# Patient Record
Sex: Female | Born: 1945 | Race: Black or African American | Hispanic: No | Marital: Married | Smoking: Light tobacco smoker
Health system: Southern US, Community
[De-identification: ages and names within clinical notes are randomized; demographics above are authoritative.]

## PROBLEM LIST (undated history)

## (undated) DIAGNOSIS — E559 Vitamin D deficiency, unspecified: Secondary | ICD-10-CM

## (undated) DIAGNOSIS — D0510 Intraductal carcinoma in situ of unspecified breast: Secondary | ICD-10-CM

## (undated) DIAGNOSIS — G5600 Carpal tunnel syndrome, unspecified upper limb: Secondary | ICD-10-CM

## (undated) DIAGNOSIS — E041 Nontoxic single thyroid nodule: Secondary | ICD-10-CM

## (undated) DIAGNOSIS — N819 Female genital prolapse, unspecified: Secondary | ICD-10-CM

## (undated) DIAGNOSIS — I10 Essential (primary) hypertension: Secondary | ICD-10-CM

## (undated) DIAGNOSIS — E119 Type 2 diabetes mellitus without complications: Secondary | ICD-10-CM

## (undated) DIAGNOSIS — C801 Malignant (primary) neoplasm, unspecified: Secondary | ICD-10-CM

## (undated) DIAGNOSIS — D126 Benign neoplasm of colon, unspecified: Secondary | ICD-10-CM

## (undated) DIAGNOSIS — E7800 Pure hypercholesterolemia, unspecified: Secondary | ICD-10-CM

## (undated) DIAGNOSIS — R7303 Prediabetes: Secondary | ICD-10-CM

## (undated) DIAGNOSIS — M1990 Unspecified osteoarthritis, unspecified site: Secondary | ICD-10-CM

## (undated) HISTORY — DX: Intraductal carcinoma in situ of unspecified breast: D05.10

## (undated) HISTORY — PX: MASTECTOMY: SHX3

## (undated) HISTORY — PX: GYNECOLOGIC CRYOSURGERY: SHX857

## (undated) HISTORY — PX: TUBAL LIGATION: SHX77

## (undated) HISTORY — DX: Benign neoplasm of colon, unspecified: D12.6

## (undated) HISTORY — DX: Type 2 diabetes mellitus without complications: E11.9

---

## 1995-06-16 HISTORY — PX: MULTIPLE TOOTH EXTRACTIONS: SHX2053

## 2004-10-13 ENCOUNTER — Ambulatory Visit: Payer: Self-pay | Admitting: Internal Medicine

## 2006-01-04 ENCOUNTER — Ambulatory Visit: Payer: Self-pay | Admitting: Internal Medicine

## 2007-02-16 ENCOUNTER — Ambulatory Visit: Payer: Self-pay | Admitting: Nurse Practitioner

## 2007-10-14 ENCOUNTER — Emergency Department: Payer: Self-pay | Admitting: Emergency Medicine

## 2007-10-14 ENCOUNTER — Other Ambulatory Visit: Payer: Self-pay

## 2009-07-18 ENCOUNTER — Ambulatory Visit: Payer: Self-pay | Admitting: Nurse Practitioner

## 2009-10-11 ENCOUNTER — Ambulatory Visit: Payer: Self-pay | Admitting: Gastroenterology

## 2010-08-27 ENCOUNTER — Ambulatory Visit: Payer: Self-pay

## 2010-08-28 ENCOUNTER — Ambulatory Visit: Payer: Self-pay

## 2011-11-17 ENCOUNTER — Ambulatory Visit: Payer: Self-pay

## 2012-11-17 ENCOUNTER — Ambulatory Visit: Payer: Self-pay | Admitting: Family Medicine

## 2013-02-28 ENCOUNTER — Ambulatory Visit: Payer: Self-pay | Admitting: Family Medicine

## 2013-08-30 DIAGNOSIS — N819 Female genital prolapse, unspecified: Secondary | ICD-10-CM | POA: Insufficient documentation

## 2013-08-30 DIAGNOSIS — D126 Benign neoplasm of colon, unspecified: Secondary | ICD-10-CM | POA: Insufficient documentation

## 2013-08-30 DIAGNOSIS — E7800 Pure hypercholesterolemia, unspecified: Secondary | ICD-10-CM | POA: Insufficient documentation

## 2013-08-30 DIAGNOSIS — G5600 Carpal tunnel syndrome, unspecified upper limb: Secondary | ICD-10-CM | POA: Insufficient documentation

## 2013-08-30 DIAGNOSIS — I10 Essential (primary) hypertension: Secondary | ICD-10-CM | POA: Insufficient documentation

## 2013-10-31 DIAGNOSIS — E041 Nontoxic single thyroid nodule: Secondary | ICD-10-CM | POA: Insufficient documentation

## 2014-01-02 ENCOUNTER — Ambulatory Visit: Payer: Self-pay | Admitting: Family Medicine

## 2014-01-02 DIAGNOSIS — E559 Vitamin D deficiency, unspecified: Secondary | ICD-10-CM | POA: Insufficient documentation

## 2014-01-02 DIAGNOSIS — E119 Type 2 diabetes mellitus without complications: Secondary | ICD-10-CM

## 2014-01-02 HISTORY — DX: Type 2 diabetes mellitus without complications: E11.9

## 2014-07-05 ENCOUNTER — Ambulatory Visit: Payer: Self-pay | Admitting: Family Medicine

## 2015-01-21 ENCOUNTER — Encounter: Payer: Self-pay | Admitting: *Deleted

## 2015-01-22 ENCOUNTER — Ambulatory Visit
Admission: RE | Admit: 2015-01-22 | Discharge: 2015-01-22 | Disposition: A | Discharge status: Home / Self Care | Payer: Medicare PPO | Source: Ambulatory Visit | Attending: Gastroenterology | Admitting: Gastroenterology

## 2015-01-22 ENCOUNTER — Ambulatory Visit: Payer: Medicare PPO | Admitting: Anesthesiology

## 2015-01-22 ENCOUNTER — Encounter: Payer: Self-pay | Admitting: *Deleted

## 2015-01-22 ENCOUNTER — Encounter
Admission: RE | Disposition: A | Discharge status: Home / Self Care | Payer: Self-pay | Source: Ambulatory Visit | Attending: Gastroenterology

## 2015-01-22 DIAGNOSIS — Z79899 Other long term (current) drug therapy: Secondary | ICD-10-CM | POA: Insufficient documentation

## 2015-01-22 DIAGNOSIS — Z833 Family history of diabetes mellitus: Secondary | ICD-10-CM | POA: Insufficient documentation

## 2015-01-22 DIAGNOSIS — K5730 Diverticulosis of large intestine without perforation or abscess without bleeding: Secondary | ICD-10-CM | POA: Diagnosis not present

## 2015-01-22 DIAGNOSIS — Z8249 Family history of ischemic heart disease and other diseases of the circulatory system: Secondary | ICD-10-CM | POA: Insufficient documentation

## 2015-01-22 DIAGNOSIS — I10 Essential (primary) hypertension: Secondary | ICD-10-CM | POA: Insufficient documentation

## 2015-01-22 DIAGNOSIS — Z888 Allergy status to other drugs, medicaments and biological substances status: Secondary | ICD-10-CM | POA: Insufficient documentation

## 2015-01-22 DIAGNOSIS — Z7982 Long term (current) use of aspirin: Secondary | ICD-10-CM | POA: Insufficient documentation

## 2015-01-22 DIAGNOSIS — Z1211 Encounter for screening for malignant neoplasm of colon: Secondary | ICD-10-CM | POA: Diagnosis not present

## 2015-01-22 DIAGNOSIS — E559 Vitamin D deficiency, unspecified: Secondary | ICD-10-CM | POA: Insufficient documentation

## 2015-01-22 DIAGNOSIS — D122 Benign neoplasm of ascending colon: Secondary | ICD-10-CM | POA: Insufficient documentation

## 2015-01-22 DIAGNOSIS — N8110 Cystocele, unspecified: Secondary | ICD-10-CM | POA: Insufficient documentation

## 2015-01-22 DIAGNOSIS — Z8042 Family history of malignant neoplasm of prostate: Secondary | ICD-10-CM | POA: Insufficient documentation

## 2015-01-22 DIAGNOSIS — E041 Nontoxic single thyroid nodule: Secondary | ICD-10-CM | POA: Diagnosis not present

## 2015-01-22 DIAGNOSIS — Z800 Family history of malignant neoplasm of digestive organs: Secondary | ICD-10-CM | POA: Insufficient documentation

## 2015-01-22 DIAGNOSIS — K529 Noninfective gastroenteritis and colitis, unspecified: Secondary | ICD-10-CM | POA: Insufficient documentation

## 2015-01-22 DIAGNOSIS — E780 Pure hypercholesterolemia: Secondary | ICD-10-CM | POA: Diagnosis not present

## 2015-01-22 DIAGNOSIS — F17200 Nicotine dependence, unspecified, uncomplicated: Secondary | ICD-10-CM | POA: Insufficient documentation

## 2015-01-22 DIAGNOSIS — E119 Type 2 diabetes mellitus without complications: Secondary | ICD-10-CM | POA: Diagnosis not present

## 2015-01-22 DIAGNOSIS — Z8262 Family history of osteoporosis: Secondary | ICD-10-CM | POA: Insufficient documentation

## 2015-01-22 HISTORY — DX: Pure hypercholesterolemia, unspecified: E78.00

## 2015-01-22 HISTORY — DX: Essential (primary) hypertension: I10

## 2015-01-22 HISTORY — DX: Type 2 diabetes mellitus without complications: E11.9

## 2015-01-22 HISTORY — DX: Female genital prolapse, unspecified: N81.9

## 2015-01-22 HISTORY — DX: Nontoxic single thyroid nodule: E04.1

## 2015-01-22 HISTORY — DX: Carpal tunnel syndrome, unspecified upper limb: G56.00

## 2015-01-22 HISTORY — PX: COLONOSCOPY WITH PROPOFOL: SHX5780

## 2015-01-22 HISTORY — DX: Vitamin D deficiency, unspecified: E55.9

## 2015-01-22 SURGERY — COLONOSCOPY WITH PROPOFOL
Anesthesia: General

## 2015-01-22 MED ORDER — GLYCOPYRROLATE 0.2 MG/ML IJ SOLN
INTRAMUSCULAR | Status: DC | PRN
  Administered 2015-01-22: 0.2 mg via INTRAVENOUS

## 2015-01-22 MED ORDER — PROPOFOL 10 MG/ML IV BOLUS
INTRAVENOUS | Status: DC | PRN
  Administered 2015-01-22: 50 mg via INTRAVENOUS

## 2015-01-22 MED ORDER — LIDOCAINE HCL (CARDIAC) 20 MG/ML IV SOLN
INTRAVENOUS | Status: DC | PRN
  Administered 2015-01-22: 60 mg via INTRAVENOUS

## 2015-01-22 MED ORDER — PROPOFOL INFUSION 10 MG/ML OPTIME
INTRAVENOUS | Status: DC | PRN
  Administered 2015-01-22: 140 ug/kg/min via INTRAVENOUS

## 2015-01-22 MED ORDER — METOPROLOL TARTRATE 1 MG/ML IV SOLN
INTRAVENOUS | Status: DC | PRN
  Administered 2015-01-22 (×2): 2 mg via INTRAVENOUS

## 2015-01-22 MED ORDER — SODIUM CHLORIDE 0.9 % IV SOLN
INTRAVENOUS | Status: DC
  Administered 2015-01-22: 14:00:00 via INTRAVENOUS

## 2015-01-22 NOTE — H&P (Signed)
Outpatient short stay form Pre-procedure 01/22/2015 2:27 PM Lynn Sails MD  Primary Physician: Dr. Thereasa Distance, Dr. Wilson Singer  Reason for visit:  Screening colonoscopy, high-risk screening due to family history of colon cancer in several primary relatives  History of present illness:  Patient is a 69 year old female presenting today for a colonoscopy as above. Colonoscopy was in 2011. He has a strong family history of colon cancer in her family with mother 2 brothers and a maternal aunt.  She takes a daily 81 mg aspirin that has been held. He takes no other aspirin products or anticoagulation medications.  She tolerated her prep well last night.  I talked with her this morning she let me know that she does have a pessary. I contacted Dr. Ouida Sills who kindly came over and remove the device we would be able to do the procedure.    Current facility-administered medications:  .  0.9 %  sodium chloride infusion, , Intravenous, Continuous, Lynn Sails, MD  Prescriptions prior to admission  Medication Sig Dispense Refill Last Dose  . aspirin EC 81 MG tablet Take 81 mg by mouth daily.   01/20/2015 at Unknown time  . atenolol (TENORMIN) 50 MG tablet Take 50 mg by mouth daily.   01/21/2015 at 2200  . clobetasol cream (TEMOVATE) 9.97 % Apply 1 application topically 2 (two) times daily.   Past Month at Unknown time  . fluticasone (FLONASE) 50 MCG/ACT nasal spray Place into both nostrils daily.   Not Taking at Unknown time  . meloxicam (MOBIC) 15 MG tablet Take 15 mg by mouth daily.   Not Taking at Unknown time     Allergies  Allergen Reactions  . Lipitor [Atorvastatin]   . Lipofen [Fenofibrate]   . Mevacor [Lovastatin]   . Zetia [Ezetimibe]   . Zocor [Simvastatin]      Past Medical History  Diagnosis Date  . Hypertension   . Carpal tunnel syndrome   . Pure hypercholesterolemia   . Vaginal vault prolapse   . Nontoxic uninodular goiter   . Vitamin D deficiency      Review of systems:      Physical Exam    Heart and lungs: Regular rate and rhythm without rub or gallop, laterally clear to auscultation.    HEENT: Normocephalic atraumatic eyes are anicteric    Other:     Pertinant exam for procedure: Soft nontender nondistended bowel sounds positive normoactive    Planned proceedures: Colonoscopy and indicated procedures I have discussed the risks benefits and complications of procedures to include not limited to bleeding, infection, perforation and the risk of sedation and the patient wishes to proceed.    Lynn Sails, MD Gastroenterology 01/22/2015  2:27 PM

## 2015-01-22 NOTE — Anesthesia Preprocedure Evaluation (Signed)
Anesthesia Evaluation  Patient identified by MRN, date of birth, ID band Patient awake    Reviewed: Allergy & Precautions, H&P , NPO status , Patient's Chart, lab work & pertinent test results  Airway Mallampati: III  TM Distance: >3 FB Neck ROM: limited    Dental  (+) Upper Dentures, Lower Dentures, Poor Dentition, Missing   Pulmonary Current Smoker,  breath sounds clear to auscultation  Pulmonary exam normal       Cardiovascular Exercise Tolerance: Good hypertension, - Past MI Normal cardiovascular examRhythm:regular Rate:Normal     Neuro/Psych  Neuromuscular disease negative neurological ROS  negative psych ROS   GI/Hepatic negative GI ROS, Neg liver ROS,   Endo/Other  negative endocrine ROS  Renal/GU negative Renal ROS  negative genitourinary   Musculoskeletal   Abdominal   Peds  Hematology negative hematology ROS (+)   Anesthesia Other Findings Past Medical History:   Hypertension                                                 Carpal tunnel syndrome                                       Pure hypercholesterolemia                                    Vaginal vault prolapse                                       Nontoxic uninodular goiter                                   Vitamin D deficiency                                         Reproductive/Obstetrics negative OB ROS                             Anesthesia Physical Anesthesia Plan  ASA: III  Anesthesia Plan: General   Post-op Pain Management:    Induction:   Airway Management Planned:   Additional Equipment:   Intra-op Plan:   Post-operative Plan:   Informed Consent: I have reviewed the patients History and Physical, chart, labs and discussed the procedure including the risks, benefits and alternatives for the proposed anesthesia with the patient or authorized representative who has indicated his/her understanding and  acceptance.   Dental Advisory Given  Plan Discussed with: Anesthesiologist, CRNA and Surgeon  Anesthesia Plan Comments:         Anesthesia Quick Evaluation

## 2015-01-22 NOTE — Transfer of Care (Signed)
Immediate Anesthesia Transfer of Care Note  Patient: Lynn Wood  Procedure(s) Performed: Procedure(s): COLONOSCOPY WITH PROPOFOL (N/A)  Patient Location: Endoscopy Unit  Anesthesia Type:General  Level of Consciousness: awake, alert , oriented and pateint uncooperative  Airway & Oxygen Therapy: Patient Spontanous Breathing and Patient connected to nasal cannula oxygen  Post-op Assessment: Report given to RN, Post -op Vital signs reviewed and stable and Patient moving all extremities X 4  Post vital signs: Reviewed and stable  Last Vitals:  Filed Vitals:   01/22/15 1514  BP: 143/63  Pulse: 77  Temp: 36.1 C  Resp: 21    Complications: No apparent anesthesia complications

## 2015-01-22 NOTE — Op Note (Signed)
[facility] Gastroenterology Patient Name: Lynn Wood Procedure Date: 01/22/2015 2:16 PM MRN: [id number] Account #: [id number] Date of Birth: 07-Sep-1945 Admit Type: Outpatient Age: 69 Room: [facility] ENDO ROOM 3 Gender: Female Note Status: Finalized Procedure:         Colonoscopy Indications:       Family history of colon cancer in multiple first-degree                     relatives Providers:         Lollie Sails, MD Medicines:         Monitored Anesthesia Care Complications:     No immediate complications. Procedure:         Pre-Anesthesia Assessment:                    - ASA Grade Assessment: III - A patient with severe                     systemic disease.                    After obtaining informed consent, the colonoscope was                     passed under direct vision. Throughout the procedure, the                     patient's blood pressure, pulse, and oxygen saturations                     were monitored continuously. The Olympus PCF-H180AL                     colonoscope ( S#: [id number] ) was introduced through the                     anus and advanced to the the cecum, identified by                     appendiceal orifice and ileocecal valve. The colonoscopy                     was performed without difficulty. The patient tolerated                     the procedure well. The quality of the bowel preparation                     was good except the ascending colon was fair. Findings:      A 1 mm polyp was found in the ascending colon in the proximal ascending       colon on a fold with atypical appearing mucosa, possibly hyperplastic.       The polyp was sessile. The polyp was removed with a cold biopsy forceps.       Resection and retrieval were complete. The fold itself was also biopsied       and placed in a separate jar.      Multiple small-mouthed diverticula were found in the sigmoid colon, in       the descending colon, in the transverse  colon, in the ascending colon       and in the cecum.      The digital rectal exam was normal. Impression:        -  One 1 mm polyp in the ascending colon in the proximal                     ascending colon. Resected and retrieved.                    - Granularity in the proximal ascending colon. Biopsied.                    - Diverticulosis in the sigmoid colon, in the descending                     colon, in the transverse colon, in the ascending colon and                     in the cecum. Recommendation:    - Await pathology results.                    - Telephone GI clinic for pathology results in 1 week. Procedure Code(s): --- Professional ---                    [phone], Colonoscopy, flexible; with biopsy, single or                     multiple Diagnosis Code(s): --- Professional ---                    211.3, Benign neoplasm of colon                    569.89, Other specified disorders of intestine                    V16.0, Family history of malignant neoplasm of                     gastrointestinal tract                    562.10, Diverticulosis of colon (without mention of                     hemorrhage) CPT copyright 2014 American Medical Association. All rights reserved. The codes documented in this report are preliminary and upon coder review may  be revised to meet current compliance requirements. Lollie Sails, MD 01/22/2015 3:14:57 PM This report has been signed electronically. Number of Addenda: 0 Note Initiated On: 01/22/2015 2:16 PM Scope Withdrawal Time: 0 hours 14 minutes 24 seconds  Total Procedure Duration: 0 hours 21 minutes 44 seconds       [facility]

## 2015-01-23 NOTE — Anesthesia Postprocedure Evaluation (Signed)
  Anesthesia Post-op Note  Patient: Lynn Wood  Procedure(s) Performed: Procedure(s): COLONOSCOPY WITH PROPOFOL (N/A)  Anesthesia type:General  Patient location: PACU  Post pain: Pain level controlled  Post assessment: Post-op Vital signs reviewed, Patient's Cardiovascular Status Stable, Respiratory Function Stable, Patent Airway and No signs of Nausea or vomiting  Post vital signs: Reviewed and stable  Last Vitals:  Filed Vitals:   01/22/15 1550  BP: 180/84  Pulse: 54  Temp:   Resp: 18    Level of consciousness: awake, alert  and patient cooperative  Complications: No apparent anesthesia complications

## 2015-01-24 LAB — SURGICAL PATHOLOGY

## 2015-01-25 ENCOUNTER — Encounter: Payer: Self-pay | Admitting: Gastroenterology

## 2015-11-05 DIAGNOSIS — M1712 Unilateral primary osteoarthritis, left knee: Secondary | ICD-10-CM | POA: Insufficient documentation

## 2016-06-02 ENCOUNTER — Other Ambulatory Visit: Payer: Self-pay | Admitting: Nurse Practitioner

## 2016-06-02 DIAGNOSIS — N6320 Unspecified lump in the left breast, unspecified quadrant: Secondary | ICD-10-CM

## 2016-06-02 DIAGNOSIS — N6452 Nipple discharge: Secondary | ICD-10-CM

## 2016-06-02 DIAGNOSIS — N6453 Retraction of nipple: Secondary | ICD-10-CM

## 2016-06-15 DIAGNOSIS — C801 Malignant (primary) neoplasm, unspecified: Secondary | ICD-10-CM

## 2016-06-15 HISTORY — DX: Malignant (primary) neoplasm, unspecified: C80.1

## 2016-06-22 ENCOUNTER — Ambulatory Visit
Admission: RE | Admit: 2016-06-22 | Discharge: 2016-06-22 | Disposition: A | Discharge status: Home / Self Care | Payer: Medicare Other | Source: Ambulatory Visit | Attending: Nurse Practitioner | Admitting: Nurse Practitioner

## 2016-06-22 DIAGNOSIS — N6452 Nipple discharge: Secondary | ICD-10-CM

## 2016-06-22 DIAGNOSIS — N6453 Retraction of nipple: Secondary | ICD-10-CM | POA: Insufficient documentation

## 2016-06-22 DIAGNOSIS — N6320 Unspecified lump in the left breast, unspecified quadrant: Secondary | ICD-10-CM

## 2016-06-23 ENCOUNTER — Other Ambulatory Visit (HOSPITAL_COMMUNITY): Payer: Self-pay | Admitting: Nurse Practitioner

## 2016-06-23 DIAGNOSIS — N6459 Other signs and symptoms in breast: Secondary | ICD-10-CM

## 2016-06-23 DIAGNOSIS — N6452 Nipple discharge: Secondary | ICD-10-CM

## 2016-06-30 ENCOUNTER — Ambulatory Visit (HOSPITAL_COMMUNITY)
Admission: RE | Admit: 2016-06-30 | Discharge: 2016-06-30 | Disposition: A | Discharge status: Home / Self Care | Payer: Medicare Other | Source: Ambulatory Visit | Attending: Nurse Practitioner | Admitting: Nurse Practitioner

## 2016-06-30 DIAGNOSIS — N6459 Other signs and symptoms in breast: Secondary | ICD-10-CM | POA: Insufficient documentation

## 2016-06-30 DIAGNOSIS — N6452 Nipple discharge: Secondary | ICD-10-CM | POA: Diagnosis not present

## 2016-06-30 LAB — POCT I-STAT CREATININE: Creatinine, Ser: 0.8 mg/dL (ref 0.44–1.00)

## 2016-06-30 MED ORDER — GADOBENATE DIMEGLUMINE 529 MG/ML IV SOLN
15.0000 mL | Freq: Once | INTRAVENOUS | Status: AC | PRN
  Administered 2016-06-30: 14 mL via INTRAVENOUS

## 2016-07-03 ENCOUNTER — Other Ambulatory Visit: Payer: Self-pay | Admitting: Nurse Practitioner

## 2016-07-03 DIAGNOSIS — N63 Unspecified lump in unspecified breast: Secondary | ICD-10-CM

## 2016-07-03 DIAGNOSIS — R590 Localized enlarged lymph nodes: Secondary | ICD-10-CM

## 2016-07-03 DIAGNOSIS — N6459 Other signs and symptoms in breast: Secondary | ICD-10-CM

## 2016-07-03 DIAGNOSIS — N6452 Nipple discharge: Secondary | ICD-10-CM

## 2016-07-07 ENCOUNTER — Ambulatory Visit
Admission: RE | Admit: 2016-07-07 | Discharge: 2016-07-07 | Disposition: A | Discharge status: Home / Self Care | Payer: Medicare Other | Source: Ambulatory Visit | Attending: Nurse Practitioner | Admitting: Nurse Practitioner

## 2016-07-07 ENCOUNTER — Other Ambulatory Visit: Payer: Self-pay | Admitting: Nurse Practitioner

## 2016-07-07 DIAGNOSIS — N6452 Nipple discharge: Secondary | ICD-10-CM

## 2016-07-07 DIAGNOSIS — N6459 Other signs and symptoms in breast: Secondary | ICD-10-CM

## 2016-07-07 DIAGNOSIS — N63 Unspecified lump in unspecified breast: Secondary | ICD-10-CM

## 2016-07-07 MED ORDER — GADOBENATE DIMEGLUMINE 529 MG/ML IV SOLN
15.0000 mL | Freq: Once | INTRAVENOUS | Status: AC | PRN
  Administered 2016-07-07: 15 mL via INTRAVENOUS

## 2016-07-10 ENCOUNTER — Ambulatory Visit
Admission: RE | Admit: 2016-07-10 | Discharge: 2016-07-10 | Disposition: A | Discharge status: Home / Self Care | Payer: Medicare Other | Source: Ambulatory Visit | Attending: Nurse Practitioner | Admitting: Nurse Practitioner

## 2016-07-10 DIAGNOSIS — R590 Localized enlarged lymph nodes: Secondary | ICD-10-CM | POA: Insufficient documentation

## 2016-07-14 ENCOUNTER — Ambulatory Visit (INDEPENDENT_AMBULATORY_CARE_PROVIDER_SITE_OTHER): Payer: Medicare Other | Admitting: Surgery

## 2016-07-14 ENCOUNTER — Encounter: Payer: Self-pay | Admitting: Surgery

## 2016-07-14 ENCOUNTER — Telehealth: Payer: Self-pay

## 2016-07-14 VITALS — BP 206/96 | HR 56 | Temp 98.3°F | Ht 64.0 in | Wt 165.8 lb

## 2016-07-14 DIAGNOSIS — D0512 Intraductal carcinoma in situ of left breast: Secondary | ICD-10-CM | POA: Diagnosis not present

## 2016-07-14 NOTE — Progress Notes (Signed)
Patient ID: Lynn Wood, female   DOB: 1945-08-11, 71 y.o.   MRN: [id number]  HPI Lynn Wood is a 71 y.o. female referred by Dr. 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 She initially went to her primary care provider with a left breast discharge that prompted mammogram and MRI this will work inconclusive and an MRI was performed. I have personally reviewed the MRI and there is evidence of a nonenhancing left breast area encompassing 6 x 2 cm this is located on this up a renal area and also on the inferior breast from 4:00 to 8:00. She did get MRI core needle biopsy of 2 areas on the left lower outer quadrant and both lesions were high grade DCIS. I have reviewed the pathology and she is ER positive PR negative. She does have a cousin with a history of breast cancer. She took birth control for 2-3 years and menopause was at age 59. She has had 3 pregnancies and her Mrs. is started when she was 71 years old. She reports no nipple discharge since December, this is intermittent. After she had a biopsy that she has some breast pain intermittently. She does smoke about 1 pack of cigarettes a week      HPI  Past Medical History:  Diagnosis Date  . Carpal tunnel syndrome   . Diabetes mellitus type 2, uncomplicated ([place]) [id number]  . Hypertension   . Nontoxic uninodular goiter   . Pure hypercholesterolemia   . Vaginal vault prolapse   . Vitamin D deficiency     Past Surgical History:  Procedure Laterality Date  . COLONOSCOPY WITH PROPOFOL N/A 01/22/2015   Procedure: COLONOSCOPY WITH PROPOFOL;  Surgeon: Lollie Sails, MD;  [facility]: [facility] ENDOSCOPY;  Service: Endoscopy;  Laterality: N/A;  . GYNECOLOGIC CRYOSURGERY      Family History  Problem Relation Age of Onset  . Breast cancer Cousin   . Colon cancer Mother   . Prostate cancer Father   . Prostate cancer Brother   . Heart disease Daughter     Social History Social History  Substance Use Topics   . Smoking status: Current Some Day Smoker    Packs/day: 0.00    Types: Cigarettes  . Smokeless tobacco: Never Used     Comment: 1 cigarette every 1-2 weeks  . Alcohol use No    Allergies  Allergen Reactions  . Lipitor [Atorvastatin] Other (See Comments)    Arthalgia  . Lipofen [Fenofibrate] Other (See Comments)    Arthalgia  . Mevacor [Lovastatin] Other (See Comments)    Arthalgia  . Zetia [Ezetimibe] Other (See Comments)    Arthalgia  . Zocor [Simvastatin] Other (See Comments)    Arthalgia    Current Outpatient Prescriptions  Medication Sig Dispense Refill  . aspirin EC 81 MG tablet Take 81 mg by mouth daily.    Marland Kitchen atenolol (TENORMIN) 50 MG tablet Take 50 mg by mouth daily.    . clobetasol cream (TEMOVATE) [id number] % Apply 1 application topically 2 (two) times daily.    . fluticasone (FLONASE) 50 MCG/ACT nasal spray Place 1 spray into both nostrils daily.     . meloxicam (MOBIC) 15 MG tablet Take 15 mg by mouth daily.    Marland Kitchen lisinopril (PRINIVIL,ZESTRIL) 10 MG tablet Take 10 mg by mouth daily.     No current facility-administered medications for this visit.      Review of Systems A 10 point review of systems was asked and was  negative except for the information on the HPI  Physical Exam Blood pressure (!) 206/96, pulse (!) 56, temperature 98.3 F (36.8 C), temperature source Oral, height 5\' 4"  (1.626 m), weight 75.2 kg (165 lb 12.8 oz). CONSTITUTIONAL: NAD EYES: Pupils are equal, round, and reactive to light, Sclera are non-icteric. EARS, NOSE, MOUTH AND THROAT: The oropharynx is clear. The oral mucosa is pink and moist. Hearing is intact to voice. BREASTS: There is 2 small sites 4 biopsies on the left side and there is diffuse tenderness and cannot appreciate a palpable mass. I cannot reproduce the nipple discharge. She does have nipple inversion the left side.  On the right breast there are no evidence of any breast lesions. There are no abnormal lymphadenopathy on her  axillas LYMPH NODES:  Lymph nodes in the neck are normal. RESPIRATORY:  Lungs are clear. There is normal respiratory effort, with equal breath sounds bilaterally, and without pathologic use of accessory muscles. CARDIOVASCULAR: Heart is regular without murmurs, gallops, or rubs. GI: The abdomen is  soft, nontender, and nondistended. There are no palpable masses. There is no hepatosplenomegaly. There are normal bowel sounds in all quadrants. GU: Rectal deferred.   MUSCULOSKELETAL: Normal muscle strength and tone. No cyanosis or edema.   SKIN: Turgor is good and there are no pathologic skin lesions or ulcers. NEUROLOGIC: Motor and sensation is grossly normal. Cranial nerves are grossly intact. PSYCH:  Oriented to person, place and time. Affect is normal.  Data Reviewed  I have personally reviewed the patient's imaging, laboratory findings and medical records.    Assessment/Plan 71 year old female with a new the diagnosed DCIS only evident on MRI as a nonenhancing mass. Discussed with the patient in detail about my thought process. Technical challenges lies in the fact that I cannot feel the mass and can not identified it on ultrasound : performing a lumpectomy in this incision areas will be flawed with potential errors including inadequate margins. After explaining to the patient that I believe she will be better served with a simple mastectomy to address the issues of potential margin positivity she is actually in agreement with me that the best course of action would be a simple mastectomy. Obviously systems DCIS I will performing a mastectomy she will need a sentinel lymph node biopsies. I also discussed with her in detail the possibility of immediate reconstructions versus delayed reconstruction ( currently not interested in reconstruction at this time),  potential adjuvant chemotherapy and radiation therapy depending on the final pathology and staging. She will also see medical oncology as well  as radiation oncology for further discussion of adjuvant therapies. As I stated I think that she is better served with a mastectomy as opposed to 2 lumpectomies given the difficult orientation on MRI images and my inability to adequately performed good surgical margins. We'll plan for a left simple mastectomy with sentinel lymph node biopsy and no reconstruction. We will also ask PCP to try to control her SBP.     Caroleen Hamman, MD FACS General Surgeon 07/14/2016, 2:11 PM

## 2016-07-14 NOTE — Telephone Encounter (Signed)
Patient scheduled for Left Mastectomy with Sentinel Node Biopsy on 07/28/16 with Dr. Dahlia Byes.  She needs to see her PCP, Dr. Ellison Hughs for BP control prior to Pre-admit appointment to optimize BP medications.  She also will need to see Oncology and Radiation Oncology prior to planned surgery.

## 2016-07-14 NOTE — Patient Instructions (Signed)
We have spoken today about breast surgery. We will schedule your Mastectomy for 07/28/16 at [facility] with Dr. Dahlia Byes.  In the meantime, we will get you an appointment with Dr. Ellison Hughs to have your Blood Pressure Medication optimized, we will also set up appointments with the Oncologist (Cancer Doctor) and the Radiation Oncologist (Radiation Doctor). I will call you with these appointment times as soon as I have them.  You will most likely spend at least 1 night in the hospital following surgery.  Please see the information provided on a Mastectomy.  Please see the (blue) pre-care surgery sheet that you have been given today for more information regarding your surgery.   Total or Modified Radical Mastectomy A total mastectomy and a modified radical mastectomy are types of surgery for breast cancer. If you are having a total mastectomy (simple mastectomy), your entire breast will be removed. If you are having a modified radical mastectomy, your breast and nipple will be removed along with the lymph nodes under your arm. You may also have some of the lining over the muscle tissues under your breast removed. LET [facility] CARE PROVIDER KNOW ABOUT:  Any allergies you have.  All medicines you are taking, including vitamins, herbs, eye drops, creams, and over-the-counter medicines.  Previous problems you or members of your family have had with the use of anesthetics.  Any blood disorders you have.  Previous surgeries you have had.  Medical conditions you have. RISKS AND COMPLICATIONS Generally, this is a safe procedure. However, problems may occur, including:  Pain.  Infection.  Bleeding.  Scar tissue.  Chest numbness on the side of the surgery.  Fluid buildup under the skin flaps where your breast was removed (seroma).  Sensation of throbbing or tingling.  Stress or sadness from losing your breast. If you have the lymph nodes under your arm removed, you may have arm swelling,  weakness, or numbness on the same side of your body as your surgery. BEFORE THE PROCEDURE  Ask your health care provider about:  Changing or stopping your regular medicines. This is especially important if you are taking diabetes medicines or blood thinners.  Taking medicines such as aspirin and ibuprofen. These medicines can thin your blood. Do not take these medicines before your procedure if your health care provider instructs you not to.  Follow your health care provider's instructions about eating or drinking restrictions.  Plan to have someone take you home after the procedure. PROCEDURE  An IV tube will be inserted into one of your veins.  You will be given a medicine that makes you fall asleep (general anesthetic).  Your breast will be cleaned with a germ-killing solution (antiseptic).  A wide incision will be made around your nipple. The skin and nipple inside the incision will be removed along with all breast tissue.  If you are having a modified radical mastectomy:  The lining over your chest muscles will be removed.  The incision may be extended to reach the lymph nodes under your arm, or a second incision may be made.  The lymph nodes will be removed.  You may have a drainage tube inserted into your incision to collect fluid that builds up after surgery. This tube is connected to a suction bulb.  Your incision or incisions will be closed with stitches (sutures).  A bandage (dressing) will be placed over your breast and under your arm. The procedure may vary among health care providers and hospitals. AFTER THE PROCEDURE  You will  be moved to a recovery area.  Your blood pressure, heart rate, breathing rate, and blood oxygen level will be monitored often until the medicines you were given have worn off.  You will be given pain medicine as needed.  After a while, you will be taken to a hospital room.  You will be encouraged to get up and walk as soon as you  can.  Your IV tube can be removed when you are able to eat and drink.  Your drain may be removed before you go home from the hospital, or you may be sent home with your drain and suction bulb.   This information is not intended to replace advice given to you by your health care provider. Make sure you discuss any questions you have with your health care provider.   Document Released: 02/24/2001 Document Revised: 06/22/2014 Document Reviewed: 02/14/2014 Elsevier Interactive Patient Education [employer].

## 2016-07-15 NOTE — Telephone Encounter (Signed)
Medical Clearance faxed to DR.Feldpausch at this time. Patient has appointment on 07/16/16.

## 2016-07-15 NOTE — Telephone Encounter (Signed)
Pt advised of pre op date/time and sx date. Sx: 07/28/16 with Dr Viann Shove Breast mastectomy with sentinel node injection.  Pre op: 07/24/16 @ 10:00am--office.   Patient made aware to arrive for surgery at 8:15am at the [facility] at [facility].   Patient also has been informed of appointment with Dr Ellison Hughs on 07/16/16 @ 2:00pm.  Freda Munro has faxed a clearance form to [place] at [phone].  Appt with Dr Baruch Gouty and Dr. Mike Gip on 07/23/16 @ 8:30am.  When I spoke with patient she states that she may have to reschedule the surgery once she finds out if her husband has to have surgery. She will call back to let me know before the end of the week.

## 2016-07-16 ENCOUNTER — Telehealth: Payer: Self-pay

## 2016-07-16 NOTE — Telephone Encounter (Signed)
Colletta Maryland from [facility] called stating that at this time Dr. Ellison Hughs was not able to sign the medical clearance for the patient. She stated that patient's medication was adjusted and that they would see her back in a week to see if the adjustment has helped with her blood pressure. Colletta Maryland stated that she will call us once Dr. Ellison Hughs signs the medical clearance form.   Patient has her surgery scheduled for 07/28/2016 with Dr. Dahlia Byes.

## 2016-07-22 ENCOUNTER — Encounter: Payer: Self-pay | Admitting: *Deleted

## 2016-07-22 NOTE — Progress Notes (Signed)
  Oncology Nurse Navigator Documentation  Navigator Location: [facility] (07/22/16 1400)   )Navigator Encounter Type: Introductory phone call (07/22/16 1400)   Abnormal Finding Date: 06/30/16 (07/22/16 1400) Confirmed Diagnosis Date: 08/10/16 (07/22/16 1400) Surgery Date: 07/28/16 (07/22/16 1400)    Called patient today to establish navigation services.  Patient is scheduled to see Dr. Baruch Gouty and Dr. Mike Gip tomorrow for consultation.  Will take patient breast cancer educational literature, "My Breast Cancer Treatment Handbook" by Josephine Igo, RN, tomorrow at her appointment.               Barriers/Navigation Needs: Education (07/22/16 1400)                          Time Spent with Patient: 15 (07/22/16 1400)

## 2016-07-23 ENCOUNTER — Encounter: Payer: Self-pay | Admitting: Hematology and Oncology

## 2016-07-23 ENCOUNTER — Ambulatory Visit
Admission: RE | Admit: 2016-07-23 | Discharge: 2016-07-23 | Disposition: A | Discharge status: Home / Self Care | Payer: Medicare Other | Source: Ambulatory Visit | Attending: Radiation Oncology | Admitting: Radiation Oncology

## 2016-07-23 ENCOUNTER — Encounter: Payer: Self-pay | Admitting: *Deleted

## 2016-07-23 ENCOUNTER — Inpatient Hospital Stay: Payer: Medicare Other | Attending: Hematology and Oncology | Admitting: Hematology and Oncology

## 2016-07-23 ENCOUNTER — Encounter: Payer: Self-pay | Admitting: Radiation Oncology

## 2016-07-23 VITALS — BP 170/72 | HR 90 | Temp 98.0°F | Resp 18 | Ht 64.0 in | Wt 164.2 lb

## 2016-07-23 VITALS — BP 170/72 | HR 90 | Temp 98.0°F | Wt 164.1 lb

## 2016-07-23 DIAGNOSIS — I10 Essential (primary) hypertension: Secondary | ICD-10-CM | POA: Insufficient documentation

## 2016-07-23 DIAGNOSIS — Z803 Family history of malignant neoplasm of breast: Secondary | ICD-10-CM | POA: Diagnosis not present

## 2016-07-23 DIAGNOSIS — Z170 Estrogen receptor positive status [ER+]: Secondary | ICD-10-CM | POA: Insufficient documentation

## 2016-07-23 DIAGNOSIS — E7800 Pure hypercholesterolemia, unspecified: Secondary | ICD-10-CM | POA: Insufficient documentation

## 2016-07-23 DIAGNOSIS — E559 Vitamin D deficiency, unspecified: Secondary | ICD-10-CM | POA: Diagnosis not present

## 2016-07-23 DIAGNOSIS — Z7982 Long term (current) use of aspirin: Secondary | ICD-10-CM | POA: Diagnosis not present

## 2016-07-23 DIAGNOSIS — Z800 Family history of malignant neoplasm of digestive organs: Secondary | ICD-10-CM | POA: Diagnosis not present

## 2016-07-23 DIAGNOSIS — N8110 Cystocele, unspecified: Secondary | ICD-10-CM | POA: Diagnosis not present

## 2016-07-23 DIAGNOSIS — E041 Nontoxic single thyroid nodule: Secondary | ICD-10-CM | POA: Insufficient documentation

## 2016-07-23 DIAGNOSIS — F17210 Nicotine dependence, cigarettes, uncomplicated: Secondary | ICD-10-CM | POA: Diagnosis not present

## 2016-07-23 DIAGNOSIS — E119 Type 2 diabetes mellitus without complications: Secondary | ICD-10-CM | POA: Diagnosis not present

## 2016-07-23 DIAGNOSIS — Z79899 Other long term (current) drug therapy: Secondary | ICD-10-CM | POA: Insufficient documentation

## 2016-07-23 DIAGNOSIS — D0512 Intraductal carcinoma in situ of left breast: Secondary | ICD-10-CM

## 2016-07-23 DIAGNOSIS — Z8042 Family history of malignant neoplasm of prostate: Secondary | ICD-10-CM | POA: Diagnosis not present

## 2016-07-23 DIAGNOSIS — G5600 Carpal tunnel syndrome, unspecified upper limb: Secondary | ICD-10-CM | POA: Diagnosis not present

## 2016-07-23 NOTE — Progress Notes (Signed)
[facility] day:  07/23/2016  Chief Complaint: Lynn Wood is a 71 y.o. female with left breast DCIS who is referred in consultation by Dr. Dahlia Byes for assessment and management.  HPI:  The patient noted of history of left nipple discharge for 2 weeks.  Bilateral diagnostic mammogram and ultrasound on 06/22/2016 revealed no abnormality.  Breast MRI on 06/30/2016 revealed diffuse regional non mass enhancement of the left subareolar and inferior breast, involving approximately 6 cm in anterior to  posterior dimension.  There was left nipple inversion with apparent skin thickening in the areolar/subareolar left breast.  There was an indeterminate lymph node within the axillary tail of the right breast.  Recommendation was for MRI guided core needle biopsy of the left breast.  A second look ultrasound of the right intramammary lymp node was suggested.  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  Ultrasound of the right axillae on 07/10/2016 revealed a stable to slightly smaller intramammary lymph node (1 cm) compared to prior imaging.  She was seen by Dr. Caroleen Hamman on 07/14/2016.  Discussion were held regarding a simple mastectomy secondary to a non-palpable abnormality that can not be seen on ultrasound (only MRI).  She is scheduled for mastectomy and sentinel lymph node biopsy on 07/28/2016.  She denies any symptoms.  She notes menses at age 89 and menopause at age 42.  She had 2 children.  Her first pregnancy was at age 37 1/2.  She did not breast feed her children.  She was on birth control for 2 years.  She has not been on hormone replacement therapy.  She notes a family history of cancer.  Her maternal first cousin had breast cancer at age 76.  Her father and two brothers had prostate cancer.  Her mother had colon cancer.     Past Medical History:  Diagnosis Date   . Carpal tunnel syndrome   . Diabetes mellitus type 2, uncomplicated ([place]) [id number]  . Hypertension   . Nontoxic uninodular goiter   . Pure hypercholesterolemia   . Vaginal vault prolapse   . Vitamin D deficiency     Past Surgical History:  Procedure Laterality Date  . COLONOSCOPY WITH PROPOFOL N/A 01/22/2015   Procedure: COLONOSCOPY WITH PROPOFOL;  Surgeon: Lollie Sails, MD;  Location: [facility] ENDOSCOPY;  Service: Endoscopy;  Laterality: N/A;  . GYNECOLOGIC CRYOSURGERY      Family History  Problem Relation Age of Onset  . Breast cancer Cousin   . Colon cancer Mother   . Prostate cancer Father   . Prostate cancer Brother   . Heart disease Daughter     Social History:  reports that she has been smoking Cigarettes.  She has been smoking about 0.00 packs per day. She has never used smokeless tobacco. She reports that she does not drink alcohol or use drugs.  She lives in [place].  She has worked for the past 28 years at [employer].  She has been exposed to ethylene oxide.  The patient is accompanied by her husband, Lynn Wood, today.  Allergies:  Allergies  Allergen Reactions  . Lipitor [Atorvastatin] Other (See Comments)    Arthalgia  . Lipofen [Fenofibrate] Other (See Comments)    Arthalgia  . Mevacor [Lovastatin] Other (See Comments)    Arthalgia  . Zetia [Ezetimibe] Other (See Comments)    Arthalgia  . Zocor [Simvastatin] Other (  See Comments)    Arthalgia    Current Medications: Current Outpatient Prescriptions  Medication Sig Dispense Refill  . amLODipine (NORVASC) 5 MG tablet Take 1 tablet by mouth daily.    Marland Kitchen aspirin EC 81 MG tablet Take 81 mg by mouth daily.    Marland Kitchen atenolol (TENORMIN) 50 MG tablet Take 50 mg by mouth daily.    . Cholecalciferol (VITAMIN D) 2000 units CAPS Take 1 capsule by mouth daily.    . clobetasol cream (TEMOVATE) AB-123456789 % Apply 1 application topically 2 (two) times daily as needed.     . fluticasone (FLONASE) 50 MCG/ACT nasal spray  Place 1 spray into both nostrils daily as needed for allergies.     Marland Kitchen lisinopril (PRINIVIL,ZESTRIL) 10 MG tablet Take 10 mg by mouth daily.    . meloxicam (MOBIC) 15 MG tablet Take 15 mg by mouth daily as needed for pain.     . metroNIDAZOLE (METROCREAM) 0.75 % cream Apply 1 application topically 2 (two) times daily.     No current facility-administered medications for this visit.     Review of Systems:  GENERAL:  Feels good.  Active.  No fevers, sweats or weight loss. PERFORMANCE STATUS (ECOG):  0 HEENT:  No visual changes, runny nose, sore throat, mouth sores or tenderness. Lungs: No shortness of breath or cough.  No hemoptysis. Cardiac:  No chest pain, palpitations, orthopnea, or PND. GI:  No nausea, vomiting, diarrhea, constipation, melena or hematochezia. GU:  No urgency, frequency, dysuria, or hematuria. Musculoskeletal:  No back pain.  No joint pain.  No muscle tenderness. Extremities:  No pain or swelling. Skin:  No rashes or skin changes. Neuro:  No headache, numbness or weakness, balance or coordination issues. Endocrine:  No diabetes, thyroid issues, hot flashes or night sweats. Psych:  No mood changes, depression or anxiety. Pain:  No focal pain. Review of systems:  All other systems reviewed and found to be negative.  Physical Exam: Blood pressure (!) 170/72, pulse 90, temperature 98 F (36.7 C), temperature source Tympanic, resp. rate 18, height 5\' 4"  (1.626 m), weight 164 lb 3.9 oz (74.5 kg). GENERAL:  Well developed, well nourished, sitting comfortably in the exam room in no acute distress. MENTAL STATUS:  Alert and oriented to person, place and time. HEAD:  Normocephalic, atraumatic, face symmetric, no Cushingoid features. EYES:  Pupils equal round and reactive to light and accomodation.  No conjunctivitis or scleral icterus. ENT:  Oropharynx clear without lesion.  Tongue normal. Mucous membranes moist.  RESPIRATORY:  Clear to auscultation without rales, wheezes or  rhonchi. CARDIOVASCULAR:  Regular rate and rhythm without murmur, rub or gallop. BREAST:  Right breast without masses, skin changes or nipple discharge.  Left breast with significant fibrocystic changes (as compared to left).  No discrete masses, skin changes or nipple discharge.  Inverted left nipple. ABDOMEN:  Soft, non-tender, with active bowel sounds, and no hepatosplenomegaly.  No masses. SKIN:  No rashes, ulcers or lesions. EXTREMITIES: No edema, no skin discoloration or tenderness.  No palpable cords. LYMPH NODES: No palpable cervical, supraclavicular, axillary or inguinal adenopathy  NEUROLOGICAL: Unremarkable. PSYCH:  Appropriate.   No visits with results within 3 Day(s) from this visit.  Latest known visit with results is:  Hospital Outpatient Visit on 06/30/2016  Component Date Value Ref Range Status  . Creatinine, Ser 06/30/2016 0.80  0.44 - 1.00 mg/dL Final    Assessment:  SHI WINCHELL is a 71 y.o. female with DCIS of the left  breast s/p MRI guided biopsy on 07/07/2016.  Pathology revealed high grade ductal carcinoma in situ.  She presented with nipple discharge.  Mammogram and ultrasound revealed no abnormality.   Breast MRI on 06/30/2016 revealed diffuse regional non mass enhancement of the left subareolar and inferior breast, involving approximately 6 cm in anterior to posterior dimension.  There was left nipple inversion with apparent skin thickening in the areolar/subareolar left breast.  There was an indeterminate lymph node within the axillary tail of the right breast.    Ultrasound of the right axillae on 07/10/2016 revealed a stable to slightly smaller intramammary lymph node (1 cm) compared to prior imaging.  Family history is notable breast cancer (maternal 1st cousin), prostate cancer (father, brother x 2), and colon cancer (mother).  Symptomatically, she denies any complaints.  Exam reveals left nipple inversion and breast asymmetry with significant apparent  fibrocystic changes in the left breast  Plan: 1.  Discuss diagnosis, staging, and management of breast cancer.  Discuss DCIS (stage 0) breast cancer.  Discuss plans for mastectomy and sentinel lymph node biopsy given large abnormality seen only on breast MRI.  Discuss pathologic review of mastectomy specimen , given large area of involvement, to ensure no evidence of invasive cancer.  Discuss plans for ER/PR testing.  If DCIS is ER positive, discuss use of tamoxifen or aromatase inhibitor for the prevention of invasive and noninvasive contralateral breast cancer.  2.  Discuss consideration of genetic testing given family history.  3.  RTC 2 weeks after mastectomy for review of final pathology and discussion regarding direction of therapy.   Lequita Asal, MD  07/23/2016, 4:00 PM

## 2016-07-23 NOTE — Progress Notes (Signed)
Patient here today as new evaluation regarding DCIS (left).  Referred by Dr. Dahlia Byes.

## 2016-07-23 NOTE — Consult Note (Signed)
NEW PATIENT EVALUATION  Name: Lynn Wood  MRN: [id number]  Date:   07/23/2016     DOB: 01-Nov-1945   This 71 y.o. female patient presents to the clinic for initial evaluation of multifocal DCIS of the left breast in patient scheduled for simple mastectomy.  REFERRING PHYSICIAN: Sofie Hartigan, MD  CHIEF COMPLAINT:  Chief Complaint  Patient presents with  . Breast Cancer    Initial Evaluation    DIAGNOSIS: The encounter diagnosis was Ductal carcinoma in situ (DCIS) of left breast.   PREVIOUS INVESTIGATIONS:  Pathology report reviewed Mammogram ultrasound and MRI scans reviewed Clinical notes reviewed  HPI: Patient is a 71 year old female who presented with left breast discharge. She was sent for mammogram which was normal showing no mammographic evidence of breast malignancy. This was confirmed on ultrasound and she did underwent an a breast MRI on 06/30/2016 showing diffuse regional non-mass enhancement the left subareolar and inferior breast evolving approxi-6 cm anterior to posterior. There is also left nipple inversion. MRI guided core biopsy was performed showing 2 areas of high-grade ductal carcinoma in situ in the left lower outer quadrant. Tumor was ER positive PR negative. Based on the field defect of her left breast with multiple sites not easily detected by either mammography or ultrasound she is opted to go ahead with left mastectomy. She seen today for possible adjuvant radiation therapy opinion. She is without complaint. She specifically denies breast tenderness cough or bone pain.  PLANNED TREATMENT REGIMEN: Mastectomy  PAST MEDICAL HISTORY:  has a past medical history of Carpal tunnel syndrome; Diabetes mellitus type 2, uncomplicated ([place]) (A999333); Hypertension; Nontoxic uninodular goiter; Pure hypercholesterolemia; Vaginal vault prolapse; and Vitamin D deficiency.    PAST SURGICAL HISTORY:  Past Surgical History:  Procedure Laterality Date  . COLONOSCOPY  WITH PROPOFOL N/A 01/22/2015   Procedure: COLONOSCOPY WITH PROPOFOL;  Surgeon: Lollie Sails, MD;  Location: [facility] ENDOSCOPY;  Service: Endoscopy;  Laterality: N/A;  . GYNECOLOGIC CRYOSURGERY      FAMILY HISTORY: family history includes Breast cancer in her cousin; Colon cancer in her mother; Heart disease in her daughter; Prostate cancer in her brother and father.  SOCIAL HISTORY:  reports that she has been smoking Cigarettes.  She has been smoking about 0.00 packs per day. She has never used smokeless tobacco. She reports that she does not drink alcohol or use drugs.  ALLERGIES: Lipitor [atorvastatin]; Lipofen [fenofibrate]; Mevacor [lovastatin]; Zetia [ezetimibe]; and Zocor [simvastatin]  MEDICATIONS:  Current Outpatient Prescriptions  Medication Sig Dispense Refill  . amLODipine (NORVASC) 5 MG tablet Take 1 tablet by mouth daily.    Marland Kitchen aspirin EC 81 MG tablet Take 81 mg by mouth daily.    Marland Kitchen atenolol (TENORMIN) 50 MG tablet Take 50 mg by mouth daily.    . Cholecalciferol (VITAMIN D) 2000 units CAPS Take 1 capsule by mouth daily.    . clobetasol cream (TEMOVATE) AB-123456789 % Apply 1 application topically 2 (two) times daily as needed.     . fluticasone (FLONASE) 50 MCG/ACT nasal spray Place 1 spray into both nostrils daily as needed for allergies.     Marland Kitchen lisinopril (PRINIVIL,ZESTRIL) 10 MG tablet Take 10 mg by mouth daily.    . meloxicam (MOBIC) 15 MG tablet Take 15 mg by mouth daily as needed for pain.     . metroNIDAZOLE (METROCREAM) 0.75 % cream Apply 1 application topically 2 (two) times daily.     No current facility-administered medications for this encounter.  ECOG PERFORMANCE STATUS:  0 - Asymptomatic  REVIEW OF SYSTEMS:  Patient denies any weight loss, fatigue, weakness, fever, chills or night sweats. Patient denies any loss of vision, blurred vision. Patient denies any ringing  of the ears or hearing loss. No irregular heartbeat. Patient denies heart murmur or history of  fainting. Patient denies any chest pain or pain radiating to her upper extremities. Patient denies any shortness of breath, difficulty breathing at night, cough or hemoptysis. Patient denies any swelling in the lower legs. Patient denies any nausea vomiting, vomiting of blood, or coffee ground material in the vomitus. Patient denies any stomach pain. Patient states has had normal bowel movements no significant constipation or diarrhea. Patient denies any dysuria, hematuria or significant nocturia. Patient denies any problems walking, swelling in the joints or loss of balance. Patient denies any skin changes, loss of hair or loss of weight. Patient denies any excessive worrying or anxiety or significant depression. Patient denies any problems with insomnia. Patient denies excessive thirst, polyuria, polydipsia. Patient denies any swollen glands, patient denies easy bruising or easy bleeding. Patient denies any recent infections, allergies or URI. Patient "s visual fields have not changed significantly in recent time.    PHYSICAL EXAM: BP (!) 170/72   Pulse 90   Temp 98 F (36.7 C)   Wt 164 lb 2.1 oz (74.5 kg)   BMI 28.17 kg/m  Lungs are clear to A&P cardiac examination essentially unremarkable with regular rate and rhythm. No dominant mass or nodularity is noted in either breast in 2 positions examined. Incision is well-healed. No axillary or supraclavicular adenopathy is appreciated. Cosmetic result is excellent. Well-developed well-nourished patient in NAD. HEENT reveals PERLA, EOMI, discs not visualized.  Oral cavity is clear. No oral mucosal lesions are identified. Neck is clear without evidence of cervical or supraclavicular adenopathy. Lungs are clear to A&P. Cardiac examination is essentially unremarkable with regular rate and rhythm without murmur rub or thrill. Abdomen is benign with no organomegaly or masses noted. Motor sensory and DTR levels are equal and symmetric in the upper and lower  extremities. Cranial nerves II through XII are grossly intact. Proprioception is intact. No peripheral adenopathy or edema is identified. No motor or sensory levels are noted. Crude visual fields are within normal range.  LABORATORY DATA: Pathology reports reviewed    RADIOLOGY RESULTS: Mammogram ultrasound and MRI scans reviewed   IMPRESSION: Multifocal ductal carcinoma in situ high-grade of the left breast in 71 year old female scheduled for mastectomy.  PLAN: At this time I doubt she will need any adjuvant treatment except possibly tamoxifen. Simple mastectomy should be sufficient to eradicate any ductal carcinoma in situ and would be highly unusual to have to treat with postmastectomy radiation. I have explained this to both the patient and her husband. I briefly gone over the risks and benefits of radiation. Will review her final pathology to make a firm for final determination. I believe mastectomy is her best avenue of treatment at this time based on the above-stated findings in her case.  I would like to take this opportunity to thank you for allowing me to participate in the care of your patient.Armstead Peaks., MD

## 2016-07-24 ENCOUNTER — Inpatient Hospital Stay: Admission: RE | Admit: 2016-07-24 | Payer: Medicare Other | Source: Ambulatory Visit

## 2016-07-24 ENCOUNTER — Telehealth: Payer: Self-pay

## 2016-07-24 ENCOUNTER — Encounter
Admission: RE | Admit: 2016-07-24 | Discharge: 2016-07-24 | Disposition: A | Discharge status: Home / Self Care | Payer: Medicare Other | Source: Ambulatory Visit | Attending: Surgery | Admitting: Surgery

## 2016-07-24 DIAGNOSIS — R9431 Abnormal electrocardiogram [ECG] [EKG]: Secondary | ICD-10-CM | POA: Diagnosis not present

## 2016-07-24 DIAGNOSIS — Z01810 Encounter for preprocedural cardiovascular examination: Secondary | ICD-10-CM | POA: Diagnosis present

## 2016-07-24 HISTORY — DX: Prediabetes: R73.03

## 2016-07-24 HISTORY — DX: Malignant (primary) neoplasm, unspecified: C80.1

## 2016-07-24 NOTE — Pre-Procedure Instructions (Signed)
Dr. Marcello Moores reviewed today's EKG, OK to precede.

## 2016-07-24 NOTE — Telephone Encounter (Signed)
Called Dr.Feldpausch office at this time to check status of Medical Clearance and patient did not show for her appointment yesterday and she did not show for her post op appointment today.

## 2016-07-24 NOTE — Patient Instructions (Signed)
  Your procedure is scheduled [id number] Feb. 13 , 2018. Report to admitting desk at 07:30 am then Radiology at 08:00am.   Remember: Instructions that are not followed completely may result in serious medical risk, up to and including death, or upon the discretion of your surgeon and anesthesiologist your surgery may need to be rescheduled.    _x___ 1. Do not eat food or drink liquids after midnight. No gum chewing or hard candies.     ____ 2. No Alcohol for 24 hours before or after surgery.   ____ 3. Bring all medications with you on the day of surgery if instructed.    __x__ 4. Notify your doctor if there is any change in your medical condition     (cold, fever, infections).    _____ 5. No smoking 24 hours prior to surgery.     Do not wear jewelry, make-up, hairpins, clips or nail polish.  Do not wear lotions, powders, or perfumes.   Do not shave 48 hours prior to surgery. Men may shave face and neck.  Do not bring valuables to the hospital.    [facility] is not responsible for any belongings or valuables.               Contacts, dentures or bridgework may not be worn into surgery.  Leave your suitcase in the car. After surgery it may be brought to your room.  For patients admitted to the hospital, discharge time is determined by your treatment team.   Patients discharged the day of surgery will not be allowed to drive home.    Please read over the following fact sheets that you were given:   [facility] Preparing for Surgery  __x__ Take these medicines the morning of surgery with A SIP OF WATER:    1. atenolol (TENORMIN)   ____ Fleet Enema (as directed)   _x___ Use CHG Soap as directed on instruction sheet  ____ Use inhalers on the day of surgery and bring to hospital day of surgery  ____ Stop metformin 2 days prior to surgery    ____ Take 1/2 of usual insulin dose the night before surgery and none on the morning of          surgery.   __x__ Stop aspirin 5 days prior  to surgery per staff of Dr. Dahlia Byes.  _x___ Stop Anti-inflammatories such as Advil, Aleve, Ibuprofen,meloxicam (MOBIC), Motrin, Naproxen,  Naprosyn, Goodies powders or aspirin products. OK to  take Tylenol.   ____ Stop supplements until after surgery.    ____ Bring C-Pap to the hospital.

## 2016-07-24 NOTE — Telephone Encounter (Signed)
Medical Clearance obtained at this time  from [facility] and will be scanned under Media.

## 2016-07-27 DIAGNOSIS — D0512 Intraductal carcinoma in situ of left breast: Secondary | ICD-10-CM | POA: Insufficient documentation

## 2016-07-27 NOTE — Progress Notes (Signed)
Met patient and her husband during her initial radiation oncology consult with Dr. Baruch Gouty.  Patient is pending mastectomy on 07/28/16 for multifocal DCIS.  Gave patient breast cancer educational literature, "My Breast Cancer Treatment Handbook" by Josephine Igo, RN.   She is to call if she has any questions or needs.

## 2016-07-28 ENCOUNTER — Inpatient Hospital Stay
Admission: AD | Admit: 2016-07-28 | Discharge: 2016-07-31 | DRG: 580 | Disposition: A | Discharge status: Home / Self Care | Payer: Medicare Other | Source: Ambulatory Visit | Attending: Surgery | Admitting: Surgery

## 2016-07-28 ENCOUNTER — Ambulatory Visit: Payer: Medicare Other | Admitting: Anesthesiology

## 2016-07-28 ENCOUNTER — Ambulatory Visit
Admission: RE | Admit: 2016-07-28 | Discharge: 2016-07-28 | Disposition: A | Discharge status: Home / Self Care | Payer: Medicare Other | Source: Ambulatory Visit | Attending: Surgery | Admitting: Surgery

## 2016-07-28 ENCOUNTER — Encounter: Payer: Self-pay | Admitting: *Deleted

## 2016-07-28 ENCOUNTER — Encounter
Admission: AD | Disposition: A | Discharge status: Home / Self Care | Payer: Self-pay | Source: Ambulatory Visit | Attending: Surgery

## 2016-07-28 DIAGNOSIS — Y838 Other surgical procedures as the cause of abnormal reaction of the patient, or of later complication, without mention of misadventure at the time of the procedure: Secondary | ICD-10-CM | POA: Diagnosis not present

## 2016-07-28 DIAGNOSIS — E7800 Pure hypercholesterolemia, unspecified: Secondary | ICD-10-CM | POA: Diagnosis present

## 2016-07-28 DIAGNOSIS — D0512 Intraductal carcinoma in situ of left breast: Secondary | ICD-10-CM

## 2016-07-28 DIAGNOSIS — Z7982 Long term (current) use of aspirin: Secondary | ICD-10-CM

## 2016-07-28 DIAGNOSIS — D62 Acute posthemorrhagic anemia: Secondary | ICD-10-CM | POA: Diagnosis not present

## 2016-07-28 DIAGNOSIS — L7632 Postprocedural hematoma of skin and subcutaneous tissue following other procedure: Secondary | ICD-10-CM | POA: Diagnosis not present

## 2016-07-28 DIAGNOSIS — Z23 Encounter for immunization: Secondary | ICD-10-CM

## 2016-07-28 DIAGNOSIS — Z79899 Other long term (current) drug therapy: Secondary | ICD-10-CM

## 2016-07-28 DIAGNOSIS — Z170 Estrogen receptor positive status [ER+]: Secondary | ICD-10-CM

## 2016-07-28 DIAGNOSIS — F17210 Nicotine dependence, cigarettes, uncomplicated: Secondary | ICD-10-CM | POA: Diagnosis present

## 2016-07-28 DIAGNOSIS — I10 Essential (primary) hypertension: Secondary | ICD-10-CM | POA: Diagnosis present

## 2016-07-28 DIAGNOSIS — D0510 Intraductal carcinoma in situ of unspecified breast: Secondary | ICD-10-CM | POA: Diagnosis present

## 2016-07-28 DIAGNOSIS — E119 Type 2 diabetes mellitus without complications: Secondary | ICD-10-CM | POA: Diagnosis present

## 2016-07-28 DIAGNOSIS — Z803 Family history of malignant neoplasm of breast: Secondary | ICD-10-CM

## 2016-07-28 HISTORY — PX: MASTECTOMY W/ SENTINEL NODE BIOPSY: SHX2001

## 2016-07-28 LAB — CBC
HEMATOCRIT: 35.5 % (ref 35.0–47.0)
Hemoglobin: 12.4 g/dL (ref 12.0–16.0)
MCH: 29.3 pg (ref 26.0–34.0)
MCHC: 34.9 g/dL (ref 32.0–36.0)
MCV: 84.1 fL (ref 80.0–100.0)
PLATELETS: 255 10*3/uL (ref 150–440)
RBC: 4.22 MIL/uL (ref 3.80–5.20)
RDW: 13.7 % (ref 11.5–14.5)
WBC: 7.6 10*3/uL (ref 3.6–11.0)

## 2016-07-28 LAB — CREATININE, SERUM
Creatinine, Ser: 0.66 mg/dL (ref 0.44–1.00)
GFR calc Af Amer: >60 mL/min (ref >60)
GFR calc non Af Amer: >60 mL/min (ref >60)

## 2016-07-28 SURGERY — MASTECTOMY WITH SENTINEL LYMPH NODE BIOPSY
Anesthesia: General | Laterality: Left

## 2016-07-28 MED ORDER — ATENOLOL 25 MG PO TABS
50.0000 mg | ORAL_TABLET | Freq: Every day | ORAL | Status: DC
  Administered 2016-07-29: 50 mg via ORAL
  Filled 2016-07-28 (×2): qty 2

## 2016-07-28 MED ORDER — MIDAZOLAM HCL 2 MG/2ML IJ SOLN
INTRAMUSCULAR | Status: DC | PRN
  Administered 2016-07-28 (×2): 1 mg via INTRAVENOUS

## 2016-07-28 MED ORDER — ONDANSETRON HCL 4 MG/2ML IJ SOLN
INTRAMUSCULAR | Status: AC
  Filled 2016-07-28: qty 2

## 2016-07-28 MED ORDER — TECHNETIUM TC 99M SULFUR COLLOID
0.8400 | Freq: Once | INTRAVENOUS | Status: AC | PRN
  Administered 2016-07-28: 0.84 via INTRAVENOUS

## 2016-07-28 MED ORDER — FAMOTIDINE 20 MG PO TABS
20.0000 mg | ORAL_TABLET | Freq: Once | ORAL | Status: AC
  Administered 2016-07-28: 20 mg via ORAL

## 2016-07-28 MED ORDER — PROPOFOL 10 MG/ML IV BOLUS
INTRAVENOUS | Status: DC | PRN
  Administered 2016-07-28: 96 mg via INTRAVENOUS

## 2016-07-28 MED ORDER — LACTATED RINGERS IV SOLN
INTRAVENOUS | Status: DC
  Administered 2016-07-28: 15:00:00 via INTRAVENOUS

## 2016-07-28 MED ORDER — ACETAMINOPHEN 500 MG PO TABS
1000.0000 mg | ORAL_TABLET | Freq: Four times a day (QID) | ORAL | Status: DC
  Administered 2016-07-28 – 2016-07-31 (×9): 1000 mg via ORAL
  Filled 2016-07-28 (×11): qty 2

## 2016-07-28 MED ORDER — KETOROLAC TROMETHAMINE 30 MG/ML IJ SOLN
INTRAMUSCULAR | Status: AC
  Filled 2016-07-28: qty 1

## 2016-07-28 MED ORDER — CHLORHEXIDINE GLUCONATE CLOTH 2 % EX PADS: 6.0000 | MEDICATED_PAD | Freq: Once | CUTANEOUS | Status: DC

## 2016-07-28 MED ORDER — FENTANYL CITRATE (PF) 100 MCG/2ML IJ SOLN
INTRAMUSCULAR | Status: DC | PRN
  Administered 2016-07-28: 25 ug via INTRAVENOUS
  Administered 2016-07-28 (×3): 50 ug via INTRAVENOUS
  Administered 2016-07-28: 25 ug via INTRAVENOUS

## 2016-07-28 MED ORDER — FENTANYL CITRATE (PF) 100 MCG/2ML IJ SOLN
25.0000 ug | INTRAMUSCULAR | Status: DC | PRN
  Administered 2016-07-28 (×4): 25 ug via INTRAVENOUS

## 2016-07-28 MED ORDER — LIDOCAINE HCL (CARDIAC) 20 MG/ML IV SOLN
INTRAVENOUS | Status: DC | PRN
  Administered 2016-07-28: 42 mg via INTRAVENOUS

## 2016-07-28 MED ORDER — CEFAZOLIN SODIUM-DEXTROSE 2-4 GM/100ML-% IV SOLN
2.0000 g | INTRAVENOUS | Status: AC
  Administered 2016-07-28: 2 g via INTRAVENOUS

## 2016-07-28 MED ORDER — LACTATED RINGERS IV SOLN: INTRAVENOUS | Status: DC

## 2016-07-28 MED ORDER — KETOROLAC TROMETHAMINE 30 MG/ML IJ SOLN
30.0000 mg | Freq: Four times a day (QID) | INTRAMUSCULAR | Status: DC
  Administered 2016-07-28 – 2016-07-29 (×4): 30 mg via INTRAVENOUS
  Filled 2016-07-28 (×4): qty 1

## 2016-07-28 MED ORDER — ACETAMINOPHEN NICU IV SYRINGE 10 MG/ML
INTRAVENOUS | Status: AC
  Filled 2016-07-28: qty 1

## 2016-07-28 MED ORDER — METHYLENE BLUE 0.5 % INJ SOLN
INTRAVENOUS | Status: AC
  Filled 2016-07-28: qty 10

## 2016-07-28 MED ORDER — FENTANYL CITRATE (PF) 100 MCG/2ML IJ SOLN
INTRAMUSCULAR | Status: AC
  Filled 2016-07-28: qty 2

## 2016-07-28 MED ORDER — MORPHINE SULFATE (PF) 4 MG/ML IV SOLN: 2.0000 mg | INTRAVENOUS | Status: DC | PRN

## 2016-07-28 MED ORDER — ONDANSETRON HCL 4 MG/2ML IJ SOLN: 4.0000 mg | Freq: Once | INTRAMUSCULAR | Status: DC | PRN

## 2016-07-28 MED ORDER — ENOXAPARIN SODIUM 40 MG/0.4ML ~~LOC~~ SOLN
40.0000 mg | SUBCUTANEOUS | Status: DC
  Filled 2016-07-28: qty 0.4

## 2016-07-28 MED ORDER — ENOXAPARIN SODIUM 40 MG/0.4ML ~~LOC~~ SOLN: 40.0000 mg | Freq: Two times a day (BID) | SUBCUTANEOUS | Status: DC

## 2016-07-28 MED ORDER — PROPOFOL 10 MG/ML IV BOLUS
INTRAVENOUS | Status: AC
  Filled 2016-07-28: qty 20

## 2016-07-28 MED ORDER — OXYCODONE HCL 5 MG PO TABS: 5.0000 mg | ORAL_TABLET | ORAL | Status: DC | PRN

## 2016-07-28 MED ORDER — MIDAZOLAM HCL 2 MG/2ML IJ SOLN
INTRAMUSCULAR | Status: AC
  Filled 2016-07-28: qty 2

## 2016-07-28 MED ORDER — ENOXAPARIN SODIUM 40 MG/0.4ML ~~LOC~~ SOLN
40.0000 mg | SUBCUTANEOUS | Status: DC
  Filled 2016-07-28 (×2): qty 0.4

## 2016-07-28 MED ORDER — DEXAMETHASONE SODIUM PHOSPHATE 10 MG/ML IJ SOLN
INTRAMUSCULAR | Status: DC | PRN
  Administered 2016-07-28: 10 mg via INTRAVENOUS

## 2016-07-28 MED ORDER — DEXAMETHASONE SODIUM PHOSPHATE 10 MG/ML IJ SOLN
INTRAMUSCULAR | Status: AC
  Filled 2016-07-28: qty 1

## 2016-07-28 MED ORDER — PANTOPRAZOLE SODIUM 40 MG IV SOLR
40.0000 mg | Freq: Every day | INTRAVENOUS | Status: DC
  Administered 2016-07-28 – 2016-07-30 (×3): 40 mg via INTRAVENOUS
  Filled 2016-07-28 (×3): qty 40

## 2016-07-28 MED ORDER — HYDRALAZINE HCL 20 MG/ML IJ SOLN: 10.0000 mg | INTRAMUSCULAR | Status: DC | PRN

## 2016-07-28 MED ORDER — EPHEDRINE 5 MG/ML INJ
INTRAVENOUS | Status: AC
  Filled 2016-07-28: qty 10

## 2016-07-28 MED ORDER — GLYCOPYRROLATE 0.2 MG/ML IJ SOLN
INTRAMUSCULAR | Status: DC | PRN
  Administered 2016-07-28: 0.2 mg via INTRAVENOUS

## 2016-07-28 MED ORDER — FENTANYL CITRATE (PF) 100 MCG/2ML IJ SOLN
INTRAMUSCULAR | Status: AC
Start: 2016-07-28 — End: 2016-07-28
  Filled 2016-07-28: qty 2

## 2016-07-28 MED ORDER — KETOROLAC TROMETHAMINE 30 MG/ML IJ SOLN: 30.0000 mg | Freq: Four times a day (QID) | INTRAMUSCULAR | Status: DC | PRN

## 2016-07-28 MED ORDER — FENTANYL CITRATE (PF) 100 MCG/2ML IJ SOLN
INTRAMUSCULAR | Status: AC
  Administered 2016-07-28: 25 ug via INTRAVENOUS
  Filled 2016-07-28: qty 2

## 2016-07-28 MED ORDER — CEFAZOLIN SODIUM-DEXTROSE 2-4 GM/100ML-% IV SOLN
INTRAVENOUS | Status: AC
  Filled 2016-07-28: qty 100

## 2016-07-28 MED ORDER — ISOSULFAN BLUE 1 % ~~LOC~~ SOLN
SUBCUTANEOUS | Status: AC
  Filled 2016-07-28: qty 5

## 2016-07-28 MED ORDER — EPHEDRINE SULFATE 50 MG/ML IJ SOLN
INTRAMUSCULAR | Status: DC | PRN
  Administered 2016-07-28: 10 mg via INTRAVENOUS

## 2016-07-28 MED ORDER — LACTATED RINGERS IV SOLN
INTRAVENOUS | Status: DC | PRN
  Administered 2016-07-28: 11:00:00 via INTRAVENOUS

## 2016-07-28 MED ORDER — KETOROLAC TROMETHAMINE 30 MG/ML IJ SOLN
INTRAMUSCULAR | Status: DC | PRN
  Administered 2016-07-28: 15 mg via INTRAVENOUS

## 2016-07-28 MED ORDER — MENTHOL 3 MG MT LOZG
1.0000 | LOZENGE | OROMUCOSAL | Status: DC | PRN
  Administered 2016-07-28 – 2016-07-29 (×2): 3 mg via ORAL
  Filled 2016-07-28: qty 9

## 2016-07-28 MED ORDER — LIDOCAINE HCL (PF) 2 % IJ SOLN
INTRAMUSCULAR | Status: AC
  Filled 2016-07-28: qty 2

## 2016-07-28 MED ORDER — ONDANSETRON HCL 4 MG/2ML IJ SOLN
INTRAMUSCULAR | Status: DC | PRN
  Administered 2016-07-28: 4 mg via INTRAVENOUS

## 2016-07-28 MED ORDER — ISOSULFAN BLUE 1 % ~~LOC~~ SOLN
SUBCUTANEOUS | Status: DC | PRN
  Administered 2016-07-28: 4 mL via SUBCUTANEOUS

## 2016-07-28 MED ORDER — INFLUENZA VAC SPLIT QUAD 0.5 ML IM SUSY
0.5000 mL | PREFILLED_SYRINGE | INTRAMUSCULAR | Status: AC
  Administered 2016-07-29: 0.5 mL via INTRAMUSCULAR
  Filled 2016-07-28: qty 0.5

## 2016-07-28 MED ORDER — FAMOTIDINE 20 MG PO TABS
ORAL_TABLET | ORAL | Status: AC
  Administered 2016-07-28: 20 mg via ORAL
  Filled 2016-07-28: qty 1

## 2016-07-28 MED ORDER — ACETAMINOPHEN 10 MG/ML IV SOLN
INTRAVENOUS | Status: DC | PRN
  Administered 2016-07-28: 1000 mg via INTRAVENOUS

## 2016-07-28 SURGICAL SUPPLY — 35 items
BULB RESERV EVAC DRAIN JP 100C (MISCELLANEOUS) ×6 IMPLANT
CANISTER SUCT 1200ML W/VALVE (MISCELLANEOUS) ×3 IMPLANT
CHLORAPREP W/TINT 26ML (MISCELLANEOUS) ×3 IMPLANT
CLIP TI MEDIUM 6 (CLIP) ×9 IMPLANT
CNTNR SPEC 2.5X3XGRAD LEK (MISCELLANEOUS)
CONT SPEC 4OZ STER OR WHT (MISCELLANEOUS)
CONTAINER SPEC 2.5X3XGRAD LEK (MISCELLANEOUS) IMPLANT
DERMABOND ADVANCED (GAUZE/BANDAGES/DRESSINGS) ×4
DERMABOND ADVANCED .7 DNX12 (GAUZE/BANDAGES/DRESSINGS) ×2 IMPLANT
DRAIN CHANNEL JP 15F RND 16 (MISCELLANEOUS) ×6 IMPLANT
DRAPE LAPAROTOMY TRNSV 106X77 (MISCELLANEOUS) ×3 IMPLANT
ELECT CAUTERY BLADE 6.4 (BLADE) ×3 IMPLANT
ELECT REM PT RETURN 9FT ADLT (ELECTROSURGICAL) ×3
ELECTRODE REM PT RTRN 9FT ADLT (ELECTROSURGICAL) ×1 IMPLANT
GLOVE BIO SURGEON STRL SZ7 (GLOVE) ×3 IMPLANT
GOWN STRL REUS W/ TWL LRG LVL3 (GOWN DISPOSABLE) ×2 IMPLANT
GOWN STRL REUS W/TWL LRG LVL3 (GOWN DISPOSABLE) ×4
LABEL OR SOLS (LABEL) ×3 IMPLANT
LIQUID BAND (GAUZE/BANDAGES/DRESSINGS) ×3 IMPLANT
NDL SAFETY 22GX1.5 (NEEDLE) ×3 IMPLANT
NEEDLE FILTER BLUNT 18X 1/2SAF (NEEDLE) ×2
NEEDLE FILTER BLUNT 18X1 1/2 (NEEDLE) ×1 IMPLANT
NEEDLE HYPO 22GX1.5 SAFETY (NEEDLE) ×3 IMPLANT
PACK BASIN MINOR ARMC (MISCELLANEOUS) ×3 IMPLANT
SLEVE PROBE SENORX GAMMA FIND (MISCELLANEOUS) ×3 IMPLANT
SPONGE LAP 18X18 5 PK (GAUZE/BANDAGES/DRESSINGS) ×6 IMPLANT
SUT ETH BLK MONO 3 0 FS 1 12/B (SUTURE) ×3 IMPLANT
SUT ETHILON 3-0 (SUTURE) ×9 IMPLANT
SUT MNCRL 4-0 (SUTURE) ×4
SUT MNCRL 4-0 27XMFL (SUTURE) ×1
SUT MNCRL 4-018XMFL (SUTURE) ×1
SUT VIC AB 2-0 CT1 (SUTURE) ×12 IMPLANT
SUTURE MNCRL 4-0 27XMF (SUTURE) ×1 IMPLANT
SUTURE MNCRL 4-018XMF (SUTURE) ×1 IMPLANT
SYRINGE 10CC LL (SYRINGE) ×3 IMPLANT

## 2016-07-28 NOTE — Anesthesia Procedure Notes (Signed)
Procedure Name: LMA Insertion Date/Time: 07/28/2016 11:03 AM Performed by: Darlyne Russian Pre-anesthesia Checklist: Patient identified, Emergency Drugs available, Suction available, Patient being monitored and Timeout performed Patient Re-evaluated:Patient Re-evaluated prior to inductionOxygen Delivery Method: Circle system utilized Preoxygenation: Pre-oxygenation with 100% oxygen Intubation Type: IV induction Ventilation: Mask ventilation without difficulty LMA: LMA inserted LMA Size: 4.0 Number of attempts: 1 Placement Confirmation: positive ETCO2 and breath sounds checked- equal and bilateral Tube secured with: Tape Dental Injury: Teeth and Oropharynx as per pre-operative assessment

## 2016-07-28 NOTE — Op Note (Signed)
  Pre-operative Diagnosis: DCIS left Breast    Post-operative Diagnosis: Same   Surgeon: Caroleen Hamman, MD FACS  Anesthesia: GETA  Procedure: Left Simple mastectomy,  Axillary sentinel node biopsy (deep) and injection of Isosulfan blue for localization of Lymph node.  Findings: Left axillary SLN both blue and hot.  Estimated Blood Loss: Minimal         Drains: None         Specimens: partial mastectomy with labels, long lateral and short superior; sentinel node for frozen section       Complications: none              Condition: Stable  Procedure Details  The patient was seen again in the Holding Room. The benefits, complications, treatment options, and expected outcomes were discussed with the patient. The risks of bleeding, infection, recurrence of symptoms, failure to resolve symptoms, hematoma, seroma, open wound, cosmetic deformity, and the need for further surgery were discussed.  The patient was taken to Operating Room, identified as Lynn Wood and the procedure verified.  A Time Out was held and the above information confirmed.  Prior to the induction of general anesthesia, antibiotic prophylaxis was administered. VTE prophylaxis was in place. Appropriate anesthesia was then administered and tolerated well. Isosulfan blue injected in the periareolar region and breast was massaged. The chest wall  was prepped with Chloraprep and draped in the sterile fashion. The patient was positioned in the supine position.   Using the hand-held probe an area of high counts was identified in the axilla, an incision was made and direction by the probe aided in dissection of a lymph node which was both blue and active. Another Node was also excised that was only blue but did not have significant uptake. Specimens sent for permanent pathology.  I created an elliptical incision for the simple mastectomy incorporating the previous axillary incision for the lymph node biopsy. Cephalad and  caudal flaps where it developing using electrocautery in the standard fashion. The margins of our dissection was the sternum medially the clavicle superiorly the mammary fold inferiorly and the latissimus dorsi laterally. We were able to obtain good margins and identified the pectoralis muscle that was our deep margins. The breast was excised from the chest wall using electrocautery in the standard fashion.  Hemostasis was performed with electrocautery.  Two # 15 Blake drains were placed incorporating the inferior and superior flaps and where secure with a 3-0 nylon sutures Once assuring that hemostasis was adequate and checked multiple times the wound was closed with interrupted 2-0 Vicryl followed by 4-0 subcuticular Monocryl sutures. Dermabond was used to coat the skin.   Caroleen Hamman, MD, FACS

## 2016-07-28 NOTE — Anesthesia Preprocedure Evaluation (Signed)
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  Neuromuscular disease negative neurological ROS  negative psych ROS   GI/Hepatic negative GI ROS, Neg liver ROS,   Endo/Other  negative endocrine ROSdiabetes, Well Controlled, Type 2  Renal/GU negative Renal ROS  negative genitourinary   Musculoskeletal   Abdominal   Peds  Hematology negative hematology ROS (+)   Anesthesia Other Findings   Reproductive/Obstetrics negative OB ROS                             Anesthesia Physical Anesthesia Plan  ASA: II  Anesthesia Plan: General LMA   Post-op Pain Management:    Induction:   Airway Management Planned:   Additional Equipment:   Intra-op Plan:   Post-operative Plan:   Informed Consent: I have reviewed the patients History and Physical, chart, labs and discussed the procedure including the risks, benefits and alternatives for the proposed anesthesia with the patient or authorized representative who has indicated his/her understanding and acceptance.     Plan Discussed with: CRNA  Anesthesia Plan Comments:         Anesthesia Quick Evaluation

## 2016-07-28 NOTE — Anesthesia Post-op Follow-up Note (Cosign Needed)
Anesthesia QCDR form completed.        

## 2016-07-28 NOTE — Interval H&P Note (Signed)
History and Physical Interval Note:  07/28/2016 8:46 AM  Lynn Wood  has presented today for surgery, with the diagnosis of DCIS  The various methods of treatment have been discussed with the patient and family. After consideration of risks, benefits and other options for treatment, the patient has consented to  Procedure(s): MASTECTOMY WITH SENTINEL LYMPH NODE BIOPSY (Left) as a surgical intervention .  The patient's history has been reviewed, patient examined, no change in status, stable for surgery.  I have reviewed the patient's chart and labs.  Questions were answered to the patient's satisfaction.     [place]

## 2016-07-28 NOTE — H&P (View-Only) (Signed)
Patient ID: Lynn Wood, female   DOB: Dec 02, 1945, 71 y.o.   MRN: [id number]  HPI Lynn Wood is a 71 y.o. female referred by Dr. 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 She initially went to her primary care provider with a left breast discharge that prompted mammogram and MRI this will work inconclusive and an MRI was performed. I have personally reviewed the MRI and there is evidence of a nonenhancing left breast area encompassing 6 x 2 cm this is located on this up a renal area and also on the inferior breast from 4:00 to 8:00. She did get MRI core needle biopsy of 2 areas on the left lower outer quadrant and both lesions were high grade DCIS. I have reviewed the pathology and she is ER positive PR negative. She does have a cousin with a history of breast cancer. She took birth control for 2-3 years and menopause was at age 41. She has had 3 pregnancies and her Mrs. is started when she was 71 years old. She reports no nipple discharge since December, this is intermittent. After she had a biopsy that she has some breast pain intermittently. She does smoke about 1 pack of cigarettes a week      HPI  Past Medical History:  Diagnosis Date  . Carpal tunnel syndrome   . Diabetes mellitus type 2, uncomplicated ([place]) [id number]  . Hypertension   . Nontoxic uninodular goiter   . Pure hypercholesterolemia   . Vaginal vault prolapse   . Vitamin D deficiency     Past Surgical History:  Procedure Laterality Date  . COLONOSCOPY WITH PROPOFOL N/A 01/22/2015   Procedure: COLONOSCOPY WITH PROPOFOL;  Surgeon: Lollie Sails, MD;  Location: [facility] ENDOSCOPY;  Service: Endoscopy;  Laterality: N/A;  . GYNECOLOGIC CRYOSURGERY      Family History  Problem Relation Age of Onset  . Breast cancer Cousin   . Colon cancer Mother   . Prostate cancer Father   . Prostate cancer Brother   . Heart disease Daughter     Social History Social History  Substance Use Topics   . Smoking status: Current Some Day Smoker    Packs/day: 0.00    Types: Cigarettes  . Smokeless tobacco: Never Used     Comment: 1 cigarette every 1-2 weeks  . Alcohol use No    Allergies  Allergen Reactions  . Lipitor [Atorvastatin] Other (See Comments)    Arthalgia  . Lipofen [Fenofibrate] Other (See Comments)    Arthalgia  . Mevacor [Lovastatin] Other (See Comments)    Arthalgia  . Zetia [Ezetimibe] Other (See Comments)    Arthalgia  . Zocor [Simvastatin] Other (See Comments)    Arthalgia    Current Outpatient Prescriptions  Medication Sig Dispense Refill  . aspirin EC 81 MG tablet Take 81 mg by mouth daily.    Marland Kitchen atenolol (TENORMIN) 50 MG tablet Take 50 mg by mouth daily.    . clobetasol cream (TEMOVATE) [id number] % Apply 1 application topically 2 (two) times daily.    . fluticasone (FLONASE) 50 MCG/ACT nasal spray Place 1 spray into both nostrils daily.     . meloxicam (MOBIC) 15 MG tablet Take 15 mg by mouth daily.    Marland Kitchen lisinopril (PRINIVIL,ZESTRIL) 10 MG tablet Take 10 mg by mouth daily.     No current facility-administered medications for this visit.      Review of Systems A 10 point review of systems was asked and was  negative except for the information on the HPI  Physical Exam Blood pressure (!) 206/96, pulse (!) 56, temperature 98.3 F (36.8 C), temperature source Oral, height 5\' 4"  (1.626 m), weight 75.2 kg (165 lb 12.8 oz). CONSTITUTIONAL: NAD EYES: Pupils are equal, round, and reactive to light, Sclera are non-icteric. EARS, NOSE, MOUTH AND THROAT: The oropharynx is clear. The oral mucosa is pink and moist. Hearing is intact to voice. BREASTS: There is 2 small sites 4 biopsies on the left side and there is diffuse tenderness and cannot appreciate a palpable mass. I cannot reproduce the nipple discharge. She does have nipple inversion the left side.  On the right breast there are no evidence of any breast lesions. There are no abnormal lymphadenopathy on her  axillas LYMPH NODES:  Lymph nodes in the neck are normal. RESPIRATORY:  Lungs are clear. There is normal respiratory effort, with equal breath sounds bilaterally, and without pathologic use of accessory muscles. CARDIOVASCULAR: Heart is regular without murmurs, gallops, or rubs. GI: The abdomen is  soft, nontender, and nondistended. There are no palpable masses. There is no hepatosplenomegaly. There are normal bowel sounds in all quadrants. GU: Rectal deferred.   MUSCULOSKELETAL: Normal muscle strength and tone. No cyanosis or edema.   SKIN: Turgor is good and there are no pathologic skin lesions or ulcers. NEUROLOGIC: Motor and sensation is grossly normal. Cranial nerves are grossly intact. PSYCH:  Oriented to person, place and time. Affect is normal.  Data Reviewed  I have personally reviewed the patient's imaging, laboratory findings and medical records.    Assessment/Plan 71 year old female with a new the diagnosed DCIS only evident on MRI as a nonenhancing mass. Discussed with the patient in detail about my thought process. Technical challenges lies in the fact that I cannot feel the mass and can not identified it on ultrasound : performing a lumpectomy in this incision areas will be flawed with potential errors including inadequate margins. After explaining to the patient that I believe she will be better served with a simple mastectomy to address the issues of potential margin positivity she is actually in agreement with me that the best course of action would be a simple mastectomy. Obviously systems DCIS I will performing a mastectomy she will need a sentinel lymph node biopsies. I also discussed with her in detail the possibility of immediate reconstructions versus delayed reconstruction ( currently not interested in reconstruction at this time),  potential adjuvant chemotherapy and radiation therapy depending on the final pathology and staging. She will also see medical oncology as well  as radiation oncology for further discussion of adjuvant therapies. As I stated I think that she is better served with a mastectomy as opposed to 2 lumpectomies given the difficult orientation on MRI images and my inability to adequately performed good surgical margins. We'll plan for a left simple mastectomy with sentinel lymph node biopsy and no reconstruction. We will also ask PCP to try to control her SBP.     Caroleen Hamman, MD FACS General Surgeon 07/14/2016, 2:11 PM

## 2016-07-28 NOTE — Pre-Procedure Instructions (Signed)
Order to give Lovenox @1345 , called pharmacy.  Pharmacy states Lovenox can not be given until 12 hours after surgery.  Pharmacy will change the time of administration.

## 2016-07-28 NOTE — Progress Notes (Signed)
lovenox dose changed to 40mg  q 24 hr due to BMI <40- start time tomorrow AM 0800

## 2016-07-28 NOTE — Progress Notes (Signed)
  Oncology Nurse Navigator Documentation  Navigator Location: [facility] (07/28/16 1300)   )        Surgery Date: 07/28/16 (07/28/16 1300)           Treatment Initiated Date: 07/28/16 (07/28/16 1300) Patient Visit Type: Surgery (07/28/16 1300) Treatment Phase: Active Tx;Treatment (surgery) (07/28/16 1300)                            Time Spent with Patient: 15 (07/28/16 1300)  Met patient , family prior to surgery. Support given.

## 2016-07-28 NOTE — Transfer of Care (Signed)
Immediate Anesthesia Transfer of Care Note  Patient: Lynn Wood  Procedure(s) Performed: Procedure(s): MASTECTOMY WITH SENTINEL LYMPH NODE BIOPSY (Left)  Patient Location: PACU  Anesthesia Type:General  Level of Consciousness: responds to stimulation  Airway & Oxygen Therapy: Patient Spontanous Breathing and Patient connected to nasal cannula oxygen  Post-op Assessment: Report given to RN and Post -op Vital signs reviewed and stable  Post vital signs: Reviewed and stable  Last Vitals:  Vitals:   07/28/16 0802 07/28/16 1330  BP: (!) 162/68 (!) 180/60  Pulse: (!) 46 (!) 58  Resp: 18 19  Temp: 36.8 C 36.5 C    Last Pain:  Vitals:   07/28/16 1330  TempSrc: Temporal  PainSc: 0-No pain         Complications: No apparent anesthesia complications

## 2016-07-29 ENCOUNTER — Encounter: Payer: Self-pay | Admitting: Surgery

## 2016-07-29 ENCOUNTER — Encounter
Admission: AD | Disposition: A | Discharge status: Home / Self Care | Payer: Self-pay | Source: Ambulatory Visit | Attending: Surgery

## 2016-07-29 ENCOUNTER — Observation Stay: Payer: Medicare Other | Admitting: Anesthesiology

## 2016-07-29 DIAGNOSIS — D62 Acute posthemorrhagic anemia: Secondary | ICD-10-CM | POA: Diagnosis not present

## 2016-07-29 DIAGNOSIS — D0512 Intraductal carcinoma in situ of left breast: Secondary | ICD-10-CM | POA: Diagnosis present

## 2016-07-29 DIAGNOSIS — F17210 Nicotine dependence, cigarettes, uncomplicated: Secondary | ICD-10-CM | POA: Diagnosis present

## 2016-07-29 DIAGNOSIS — Y838 Other surgical procedures as the cause of abnormal reaction of the patient, or of later complication, without mention of misadventure at the time of the procedure: Secondary | ICD-10-CM | POA: Diagnosis not present

## 2016-07-29 DIAGNOSIS — E119 Type 2 diabetes mellitus without complications: Secondary | ICD-10-CM | POA: Diagnosis present

## 2016-07-29 DIAGNOSIS — Z170 Estrogen receptor positive status [ER+]: Secondary | ICD-10-CM | POA: Diagnosis not present

## 2016-07-29 DIAGNOSIS — L7632 Postprocedural hematoma of skin and subcutaneous tissue following other procedure: Secondary | ICD-10-CM | POA: Diagnosis not present

## 2016-07-29 DIAGNOSIS — E7800 Pure hypercholesterolemia, unspecified: Secondary | ICD-10-CM | POA: Diagnosis present

## 2016-07-29 DIAGNOSIS — Z79899 Other long term (current) drug therapy: Secondary | ICD-10-CM | POA: Diagnosis not present

## 2016-07-29 DIAGNOSIS — Z7982 Long term (current) use of aspirin: Secondary | ICD-10-CM | POA: Diagnosis not present

## 2016-07-29 DIAGNOSIS — Z803 Family history of malignant neoplasm of breast: Secondary | ICD-10-CM | POA: Diagnosis not present

## 2016-07-29 DIAGNOSIS — I10 Essential (primary) hypertension: Secondary | ICD-10-CM | POA: Diagnosis present

## 2016-07-29 DIAGNOSIS — Z23 Encounter for immunization: Secondary | ICD-10-CM | POA: Diagnosis present

## 2016-07-29 HISTORY — PX: INCISION AND DRAINAGE ABSCESS: SHX5864

## 2016-07-29 LAB — CBC
HEMATOCRIT: 26.6 % — AB (ref 35.0–47.0)
HEMATOCRIT: 24.8 % — AB (ref 35.0–47.0)
Hemoglobin: 9 g/dL — ABNORMAL LOW (ref 12.0–16.0)
Hemoglobin: 8.3 g/dL — ABNORMAL LOW (ref 12.0–16.0)
MCH: 28.6 pg (ref 26.0–34.0)
MCH: 28.5 pg (ref 26.0–34.0)
MCHC: 33.9 g/dL (ref 32.0–36.0)
MCHC: 33.4 g/dL (ref 32.0–36.0)
MCV: 84.3 fL (ref 80.0–100.0)
MCV: 85.5 fL (ref 80.0–100.0)
PLATELETS: 220 10*3/uL (ref 150–440)
Platelets: 224 10*3/uL (ref 150–440)
RBC: 3.16 MIL/uL — ABNORMAL LOW (ref 3.80–5.20)
RBC: 2.9 MIL/uL — ABNORMAL LOW (ref 3.80–5.20)
RDW: 13.6 % (ref 11.5–14.5)
RDW: 13.9 % (ref 11.5–14.5)
WBC: 14.8 10*3/uL — ABNORMAL HIGH (ref 3.6–11.0)
WBC: 10.3 10*3/uL (ref 3.6–11.0)

## 2016-07-29 LAB — HEMOGLOBIN AND HEMATOCRIT, BLOOD
HCT: 24.5 % — ABNORMAL LOW (ref 35.0–47.0)
Hemoglobin: 8 g/dL — ABNORMAL LOW (ref 12.0–16.0)

## 2016-07-29 LAB — PROTIME-INR
INR: 1.11
Prothrombin Time: 14.4 seconds (ref 11.4–15.2)

## 2016-07-29 LAB — APTT: APTT: 26 s (ref 24–36)

## 2016-07-29 SURGERY — INCISION AND DRAINAGE, ABSCESS
Anesthesia: General | Site: Breast | Laterality: Left | Wound class: Clean

## 2016-07-29 MED ORDER — DEXAMETHASONE SODIUM PHOSPHATE 10 MG/ML IJ SOLN
INTRAMUSCULAR | Status: AC
  Filled 2016-07-29: qty 1

## 2016-07-29 MED ORDER — FENTANYL CITRATE (PF) 100 MCG/2ML IJ SOLN
INTRAMUSCULAR | Status: DC | PRN
  Administered 2016-07-29 (×2): 50 ug via INTRAVENOUS

## 2016-07-29 MED ORDER — LIDOCAINE HCL (CARDIAC) 20 MG/ML IV SOLN
INTRAVENOUS | Status: DC | PRN
  Administered 2016-07-29: 80 mg via INTRAVENOUS

## 2016-07-29 MED ORDER — LACTATED RINGERS IV SOLN
INTRAVENOUS | Status: DC | PRN
  Administered 2016-07-29: 17:00:00 via INTRAVENOUS

## 2016-07-29 MED ORDER — FENTANYL CITRATE (PF) 100 MCG/2ML IJ SOLN
25.0000 ug | INTRAMUSCULAR | Status: DC | PRN
  Administered 2016-07-29: 50 ug via INTRAVENOUS

## 2016-07-29 MED ORDER — CEFAZOLIN SODIUM-DEXTROSE 2-4 GM/100ML-% IV SOLN: 2.0000 g | Freq: Three times a day (TID) | INTRAVENOUS | Status: DC

## 2016-07-29 MED ORDER — LACTATED RINGERS IV BOLUS (SEPSIS)
1000.0000 mL | Freq: Once | INTRAVENOUS | Status: AC
  Administered 2016-07-29: 1000 mL via INTRAVENOUS

## 2016-07-29 MED ORDER — ROCURONIUM BROMIDE 50 MG/5ML IV SOLN
INTRAVENOUS | Status: AC
  Filled 2016-07-29: qty 1

## 2016-07-29 MED ORDER — FENTANYL CITRATE (PF) 100 MCG/2ML IJ SOLN
INTRAMUSCULAR | Status: AC
  Filled 2016-07-29: qty 2

## 2016-07-29 MED ORDER — EPHEDRINE SULFATE 50 MG/ML IJ SOLN
INTRAMUSCULAR | Status: AC
  Filled 2016-07-29: qty 1

## 2016-07-29 MED ORDER — EPHEDRINE SULFATE 50 MG/ML IJ SOLN
INTRAMUSCULAR | Status: DC | PRN
  Administered 2016-07-29: 10 mg via INTRAVENOUS

## 2016-07-29 MED ORDER — DEXAMETHASONE SODIUM PHOSPHATE 10 MG/ML IJ SOLN
INTRAMUSCULAR | Status: DC | PRN
  Administered 2016-07-29: 5 mg via INTRAVENOUS

## 2016-07-29 MED ORDER — PROPOFOL 10 MG/ML IV BOLUS
INTRAVENOUS | Status: AC
  Filled 2016-07-29: qty 20

## 2016-07-29 MED ORDER — OXYCODONE HCL 5 MG/5ML PO SOLN: 5.0000 mg | Freq: Once | ORAL | Status: DC | PRN

## 2016-07-29 MED ORDER — CEFAZOLIN SODIUM-DEXTROSE 2-3 GM-% IV SOLR
2.0000 g | Freq: Three times a day (TID) | INTRAVENOUS | Status: DC
  Administered 2016-07-29: 2 g via INTRAVENOUS
  Filled 2016-07-29 (×3): qty 50

## 2016-07-29 MED ORDER — MIDAZOLAM HCL 2 MG/2ML IJ SOLN
INTRAMUSCULAR | Status: AC
  Filled 2016-07-29: qty 2

## 2016-07-29 MED ORDER — MINERAL OIL LIGHT 100 % EX OIL
TOPICAL_OIL | CUTANEOUS | Status: AC
Start: 2016-07-29 — End: 2016-07-29
  Filled 2016-07-29: qty 25

## 2016-07-29 MED ORDER — MIDAZOLAM HCL 2 MG/2ML IJ SOLN
INTRAMUSCULAR | Status: DC | PRN
  Administered 2016-07-29: 2 mg via INTRAVENOUS

## 2016-07-29 MED ORDER — SUCCINYLCHOLINE CHLORIDE 20 MG/ML IJ SOLN
INTRAMUSCULAR | Status: DC | PRN
  Administered 2016-07-29: 100 mg via INTRAVENOUS

## 2016-07-29 MED ORDER — ONDANSETRON HCL 4 MG/2ML IJ SOLN
INTRAMUSCULAR | Status: AC
Start: 2016-07-29 — End: 2016-07-29
  Filled 2016-07-29: qty 2

## 2016-07-29 MED ORDER — PROMETHAZINE HCL 25 MG/ML IJ SOLN: 6.2500 mg | INTRAMUSCULAR | Status: DC | PRN

## 2016-07-29 MED ORDER — PROPOFOL 10 MG/ML IV BOLUS
INTRAVENOUS | Status: DC | PRN
  Administered 2016-07-29: 80 mg via INTRAVENOUS
  Administered 2016-07-29: 120 mg via INTRAVENOUS

## 2016-07-29 MED ORDER — LACTATED RINGERS IV SOLN
INTRAVENOUS | Status: DC
  Administered 2016-07-29 – 2016-07-30 (×2): via INTRAVENOUS

## 2016-07-29 MED ORDER — ONDANSETRON HCL 4 MG/2ML IJ SOLN
INTRAMUSCULAR | Status: DC | PRN
  Administered 2016-07-29: 4 mg via INTRAVENOUS

## 2016-07-29 MED ORDER — OXYCODONE HCL 5 MG PO TABS: 5.0000 mg | ORAL_TABLET | Freq: Once | ORAL | Status: DC | PRN

## 2016-07-29 MED ORDER — SODIUM CHLORIDE 0.9 % IJ SOLN
INTRAMUSCULAR | Status: AC
  Filled 2016-07-29: qty 10

## 2016-07-29 MED ORDER — MEPERIDINE HCL 25 MG/ML IJ SOLN: 6.2500 mg | INTRAMUSCULAR | Status: DC | PRN

## 2016-07-29 SURGICAL SUPPLY — 27 items
BLADE CLIPPER SURG (BLADE) ×3 IMPLANT
BLADE SURG 15 STRL LF DISP TIS (BLADE) ×1 IMPLANT
BLADE SURG 15 STRL SS (BLADE) ×2
BRA SURGICAL MEDIUM (MISCELLANEOUS) ×3 IMPLANT
BULB RESERV EVAC DRAIN JP 100C (MISCELLANEOUS) ×6 IMPLANT
CANISTER SUCT 3000ML (MISCELLANEOUS) ×3 IMPLANT
DERMABOND ADVANCED (GAUZE/BANDAGES/DRESSINGS) ×2
DERMABOND ADVANCED .7 DNX12 (GAUZE/BANDAGES/DRESSINGS) ×1 IMPLANT
ELECT REM PT RETURN 9FT ADLT (ELECTROSURGICAL) ×3
ELECTRODE REM PT RTRN 9FT ADLT (ELECTROSURGICAL) ×1 IMPLANT
ENEMA MINERAL OIL (MISCELLANEOUS) ×3 IMPLANT
GAUZE FLUFF 18X24 1PLY STRL (GAUZE/BANDAGES/DRESSINGS) ×3 IMPLANT
GLOVE BIO SURGEON STRL SZ7 (GLOVE) ×3 IMPLANT
GOWN STRL REUS W/ TWL LRG LVL3 (GOWN DISPOSABLE) ×2 IMPLANT
GOWN STRL REUS W/TWL LRG LVL3 (GOWN DISPOSABLE) ×4
NEEDLE HYPO 22GX1.5 SAFETY (NEEDLE) ×3 IMPLANT
NS IRRIG 1000ML POUR BTL (IV SOLUTION) ×3 IMPLANT
PACK BASIN MINOR ARMC (MISCELLANEOUS) ×3 IMPLANT
SCRUB POVIDONE IODINE 4 OZ (MISCELLANEOUS) ×3 IMPLANT
SOL PREP PVP 2OZ (MISCELLANEOUS) ×3
SOLUTION PREP PVP 2OZ (MISCELLANEOUS) ×1 IMPLANT
SPONGE LAP 18X18 5 PK (GAUZE/BANDAGES/DRESSINGS) ×9 IMPLANT
SUT MNCRL 4-0 (SUTURE) ×2
SUT MNCRL 4-0 27XMFL (SUTURE) ×1
SUT VIC AB 2-0 CT2 27 (SUTURE) ×12 IMPLANT
SUTURE MNCRL 4-0 27XMF (SUTURE) ×1 IMPLANT
WATER STERILE IRR 1000ML POUR (IV SOLUTION) ×9 IMPLANT

## 2016-07-29 NOTE — Progress Notes (Signed)
When patient was walking back from the bathroom she started to feel dizzy like she was going to pass out.  She was able to make it to the bed.  VSS

## 2016-07-29 NOTE — Progress Notes (Signed)
Patient leaving for the OR via the bed by OR ordelry

## 2016-07-29 NOTE — Anesthesia Preprocedure Evaluation (Signed)
Anesthesia Evaluation  Patient identified by MRN, date of birth, ID band Patient awake    Reviewed: Allergy & Precautions, NPO status , Patient's Chart, lab work & pertinent test results  History of Anesthesia Complications Negative for: history of anesthetic complications  Airway Mallampati: II  TM Distance: >3 FB Neck ROM: Full    Dental  (+) Upper Dentures, Lower Dentures   Pulmonary neg sleep apnea, neg COPD, Current Smoker,    breath sounds clear to auscultation- rhonchi (-) wheezing      Cardiovascular Exercise Tolerance: Good hypertension, Pt. on medications (-) CAD and (-) Past MI  Rhythm:Regular Rate:Normal - Systolic murmurs and - Diastolic murmurs    Neuro/Psych negative neurological ROS  negative psych ROS   GI/Hepatic negative GI ROS, Neg liver ROS,   Endo/Other  Diabetes: borderline.  Renal/GU negative Renal ROS     Musculoskeletal  (+) Arthritis ,   Abdominal (+) - obese,   Peds  Hematology negative hematology ROS (+)   Anesthesia Other Findings Past Medical History: 06/2016: Cancer ([place])     Comment: left breast No date: Carpal tunnel syndrome No date: Hypertension No date: Nontoxic uninodular goiter No date: Pre-diabetes No date: Pure hypercholesterolemia No date: Vaginal vault prolapse No date: Vitamin D deficiency   Reproductive/Obstetrics                             Anesthesia Physical Anesthesia Plan  ASA: II and emergent  Anesthesia Plan: General   Post-op Pain Management:    Induction: Intravenous, Rapid sequence and Cricoid pressure planned  Airway Management Planned: Oral ETT  Additional Equipment:   Intra-op Plan:   Post-operative Plan: Extubation in OR  Informed Consent: I have reviewed the patients History and Physical, chart, labs and discussed the procedure including the risks, benefits and alternatives for the proposed anesthesia with  the patient or authorized representative who has indicated his/her understanding and acceptance.   Dental advisory given  Plan Discussed with: CRNA and Anesthesiologist  Anesthesia Plan Comments:         Anesthesia Quick Evaluation

## 2016-07-29 NOTE — Op Note (Signed)
  Pre-operative Diagnosis: S/p mastectomy with postoperative hematoma and bleeding    Post-operative Diagnosis: Same   Surgeon: Caroleen Hamman, MD FACS  Anesthesia: GETA  Procedure: Re-exploration mastectomy site with evacuation of hematoma  Findings: Hematoma on medial aspect of mastectomy site, no active bleeders  Estimated Blood Loss: 50 cc  w about 300cc of clot         Drains: same as previous         Specimens: none       Complications: none                Condition: Stable  Procedure Details  The patient was seen again in the Holding Room. The benefits, complications, treatment options, and expected outcomes were discussed with the patient. The risks of bleeding, infection, recurrence of symptoms, failure to resolve symptoms, hematoma, seroma, open wound, cosmetic deformity, and the need for further surgery were discussed.  The patient was taken to Operating Room, identified as Lynn Wood and the procedure verified.  A Time Out was held and the above information confirmed.  Prior to the induction of general anesthesia, antibiotic prophylaxis was administered. VTE prophylaxis was in place. Appropriate anesthesia was then administered and tolerated well. The chest was prepped with Chloraprep and draped in the sterile fashion. The patient was positioned in the supine position.   We used mineral oil to remove the dermabond and metz to open the mastectomy site. There was a large clot on the medial aspect that we evacuated. We visualized the flaps caudally and cranially and there was no evidence of active bleeding. Irrigation performed in the standard fashion again adequate hemostasis was visualized. We left the JP in the same position as before. The wound was closed in a two layered fashion w 2-0 vicryl and 4-0 Monocryl. Dermabond used to coat the skin. Mastectomy Bras and Fluff were placed.  Patient was taken to the recovery room in stable condition where a postoperative chest  film has been ordered.    Caroleen Hamman, MD, FACS

## 2016-07-29 NOTE — Anesthesia Post-op Follow-up Note (Cosign Needed)
Anesthesia QCDR form completed.        

## 2016-07-29 NOTE — Transfer of Care (Signed)
Immediate Anesthesia Transfer of Care Note  Patient: Lynn Wood  Procedure(s) Performed: Procedure(s): Reexploration of left breast mastectomy site (Left)  Patient Location: PACU  Anesthesia Type:General  Level of Consciousness: sedated and responds to stimulation  Airway & Oxygen Therapy: Patient Spontanous Breathing and Patient connected to face mask oxygen  Post-op Assessment: Report given to RN and Post -op Vital signs reviewed and stable  Post vital signs: Reviewed and stable  Last Vitals:  Vitals:   07/29/16 1149 07/29/16 1826  BP: (!) 166/54 (!) 131/51  Pulse: (!) 52   Resp: 18 20  Temp: 36.9 C 36.2 C    Last Pain:  Vitals:   07/29/16 1324  TempSrc:   PainSc: 3          Complications: No apparent anesthesia complications

## 2016-07-29 NOTE — Progress Notes (Signed)
Patient had order for bedrest prior to surgery.  Dr Dahlia Byes gave an order for up with assistance

## 2016-07-29 NOTE — Progress Notes (Signed)
Dr. Burt Knack called for IVF and diet. Acknowledged. New orders written. Barbaraann Faster, RN 07/29/2016 7:44 PM

## 2016-07-29 NOTE — Anesthesia Postprocedure Evaluation (Signed)
Anesthesia Post Note  Patient: Lynn Wood  Procedure(s) Performed: Procedure(s) (LRB): Reexploration of left breast mastectomy site (Left)  Patient location during evaluation: PACU Anesthesia Type: General Level of consciousness: awake and alert and oriented Pain management: pain level controlled Vital Signs Assessment: post-procedure vital signs reviewed and stable Respiratory status: spontaneous breathing, nonlabored ventilation and respiratory function stable Cardiovascular status: blood pressure returned to baseline and stable Postop Assessment: no signs of nausea or vomiting Anesthetic complications: no     Last Vitals:  Vitals:   07/29/16 1911 07/29/16 1927  BP:  (!) 128/47  Pulse:  (!) 55  Resp:  17  Temp: 36.1 C 36.5 C    Last Pain:  Vitals:   07/29/16 1948  TempSrc:   PainSc: 0-No pain                 Jamille Fisher

## 2016-07-29 NOTE — Progress Notes (Signed)
Pt seen and examined again still putting out about 40cc sanguinous output /hr. Not improving HD are adequate, no evidence of hypotension. She is B blocked. D/w the pt in detail and I do think that the safest thing to do is to re-exploration and hemostasis control. D/W the pt about the risks , benefits and possible complications. She understands and wishes to proceed.

## 2016-07-29 NOTE — Anesthesia Procedure Notes (Addendum)
Procedure Name: Intubation Date/Time: 07/29/2016 5:05 PM Performed by: Doreen Salvage Pre-anesthesia Checklist: Patient identified, Patient being monitored, Timeout performed, Emergency Drugs available and Suction available Patient Re-evaluated:Patient Re-evaluated prior to inductionOxygen Delivery Method: Circle system utilized Preoxygenation: Pre-oxygenation with 100% oxygen Intubation Type: IV induction Ventilation: Mask ventilation without difficulty Laryngoscope Size: Mac and 3 Grade View: Grade I Tube type: Oral Tube size: 7.0 mm Number of attempts: 1 Airway Equipment and Method: Stylet Placement Confirmation: ETT inserted through vocal cords under direct vision,  positive ETCO2 and breath sounds checked- equal and bilateral Secured at: 23 cm Tube secured with: Tape Dental Injury: Teeth and Oropharynx as per pre-operative assessment

## 2016-07-29 NOTE — Progress Notes (Signed)
POD # 1 s/p mastectomy Had bradycardia w/o HD compromise Drain output 350cc x 24 hrs Hb did drop 3 grams to 9 She was getting toradol schedule and did have some ASA preop VSS Tolerating PO  PE NAD Awake alert in good spirits Chest : incision healing well, sanguinous output from drain. I milked some clots from JP and place Fluffs and compression Bra ( she did not have one before) No evidence of infection or expanding hematoma  A/P Hematoma, currently no evidence of active bleeding  But I will recheck on her this pm, since this am there has been 175 cc for aprx 4 1/2 hrs. Hold lovenox and NSAIDs If output does not improve may have to do washout and re-intervene D/W pt in detail

## 2016-07-30 ENCOUNTER — Encounter: Payer: Self-pay | Admitting: Surgery

## 2016-07-30 LAB — CBC
HCT: 20.8 % — ABNORMAL LOW (ref 35.0–47.0)
Hemoglobin: 7.2 g/dL — ABNORMAL LOW (ref 12.0–16.0)
MCH: 29.2 pg (ref 26.0–34.0)
MCHC: 34.8 g/dL (ref 32.0–36.0)
MCV: 84 fL (ref 80.0–100.0)
PLATELETS: 197 10*3/uL (ref 150–440)
RBC: 2.47 MIL/uL — AB (ref 3.80–5.20)
RDW: 13.8 % (ref 11.5–14.5)
WBC: 9.3 10*3/uL (ref 3.6–11.0)

## 2016-07-30 MED ORDER — DOCUSATE SODIUM 100 MG PO CAPS
100.0000 mg | ORAL_CAPSULE | Freq: Two times a day (BID) | ORAL | Status: DC
  Administered 2016-07-30 – 2016-07-31 (×3): 100 mg via ORAL
  Filled 2016-07-30 (×3): qty 1

## 2016-07-30 MED ORDER — FERROUS SULFATE 325 (65 FE) MG PO TABS
325.0000 mg | ORAL_TABLET | Freq: Two times a day (BID) | ORAL | Status: DC
  Administered 2016-07-30 – 2016-07-31 (×3): 325 mg via ORAL
  Filled 2016-07-30 (×3): qty 1

## 2016-07-30 NOTE — Plan of Care (Signed)
Problem: Pain Management: Goal: General experience of comfort will improve Outcome: Adequate for Discharge Denies pain overnight.

## 2016-07-30 NOTE — Progress Notes (Signed)
  Oncology Nurse Navigator Documentation  Navigator Location: [facility] (07/29/16 1130)   )Navigator Encounter Type: Treatment (post-op) (07/29/16 1130)                     Patient Visit Type: Follow-up;Surgery (07/29/16 1130)                    Acuity: Level 2 (07/29/16 1130)   Acuity Level 2: Ongoing guidance and education throughout treatment as needed (07/29/16 1130)     Time Spent with Patient: 30 (07/29/16 1130)  Met with patient as inpatient to assess post-op needs.  She is unsure if discharge will be today or tomorrow. Doing well with minimal discomfort.

## 2016-07-30 NOTE — Progress Notes (Signed)
POD # 2 s/p mastectomy POD # 1 for evacuation of hematoma Decrease in output from JP HB down to 7 but no HD repercussion VSS  PE NAD Chest: site healign well, no expanding hematoma, JP serosanguinous   A/P Doing better Start Fe Regular diet Mobilize DC in am, may need RBC if she becomes symptomatic

## 2016-07-30 NOTE — Progress Notes (Signed)
Seen and examined VSS Taking PO and ambulating  PE NAD Chest: incisions c/d/i, serosanguinous draiange from JP. NO expanding hematoma  A/p Doing well Anemia from acute blood loss f/u hb may need transfusion if less 7

## 2016-07-30 NOTE — Anesthesia Postprocedure Evaluation (Signed)
Anesthesia Post Note  Patient: Lynn Wood  Procedure(s) Performed: Procedure(s) (LRB): MASTECTOMY WITH SENTINEL LYMPH NODE BIOPSY (Left)  Patient location during evaluation: PACU Anesthesia Type: General Level of consciousness: awake and alert Pain management: pain level controlled Vital Signs Assessment: post-procedure vital signs reviewed and stable Respiratory status: spontaneous breathing, nonlabored ventilation, respiratory function stable and patient connected to nasal cannula oxygen Cardiovascular status: blood pressure returned to baseline and stable Postop Assessment: no signs of nausea or vomiting Anesthetic complications: no     Last Vitals:  Vitals:   07/30/16 0823 07/30/16 1425  BP: (!) 145/50 (!) 153/61  Pulse: (!) 55 69  Resp: 16 18  Temp: 36.7 C 36.8 C    Last Pain:  Vitals:   07/30/16 1425  TempSrc: Oral  PainSc:                  Molli Barrows

## 2016-07-31 ENCOUNTER — Telehealth: Payer: Self-pay

## 2016-07-31 DIAGNOSIS — R58 Hemorrhage, not elsewhere classified: Secondary | ICD-10-CM

## 2016-07-31 LAB — CBC
HEMATOCRIT: 19.8 % — AB (ref 35.0–47.0)
HEMOGLOBIN: 6.7 g/dL — AB (ref 12.0–16.0)
MCH: 29 pg (ref 26.0–34.0)
MCHC: 34.1 g/dL (ref 32.0–36.0)
MCV: 85.1 fL (ref 80.0–100.0)
Platelets: 205 10*3/uL (ref 150–440)
RBC: 2.32 MIL/uL — ABNORMAL LOW (ref 3.80–5.20)
RDW: 13.6 % (ref 11.5–14.5)
WBC: 9.9 10*3/uL (ref 3.6–11.0)

## 2016-07-31 MED ORDER — FERROUS SULFATE 325 (65 FE) MG PO TABS: 325.0000 mg | ORAL_TABLET | Freq: Two times a day (BID) | ORAL | 3 refills | Status: DC

## 2016-07-31 MED ORDER — HYDROCODONE-ACETAMINOPHEN 5-325 MG PO TABS: 1.0000 | ORAL_TABLET | Freq: Four times a day (QID) | ORAL | 0 refills | Status: DC | PRN

## 2016-07-31 MED ORDER — DOCUSATE SODIUM 100 MG PO CAPS: 100.0000 mg | ORAL_CAPSULE | Freq: Two times a day (BID) | ORAL | 0 refills | Status: DC

## 2016-07-31 MED ORDER — ATENOLOL 25 MG PO TABS
50.0000 mg | ORAL_TABLET | Freq: Every day | ORAL | Status: DC
  Administered 2016-07-31: 50 mg via ORAL
  Filled 2016-07-31: qty 2

## 2016-07-31 NOTE — Telephone Encounter (Signed)
Received notification from Dr. Dahlia Byes that patient will be discharged today and will need to be seen next week in office with a CBC prior to appointment.  Orders placed and appointment has been made.  Patient will need to arrive to the registration desk in the medical mall 1 hour prior to appointment for her bloodwork to be drawn.

## 2016-07-31 NOTE — Discharge Summary (Signed)
Patient ID: SAJDAH TUNE MRN: [id number] DOB/AGE: 70-15-1947 70 y.o.  Admit date: 07/28/2016 Discharge date: 07/31/2016   Discharge Diagnoses:  Active Problems:   DCIS (ductal carcinoma in situ)   Procedures: 1. Left simples mastectomy SLNBx                        2. Re-exploration mastectomy site hematoma evacuation  Hospital Course: 71 yo female w Large area  DCIS detected only on MRI. She underwent a simple left mastectomy with sentinel node biopsy. Initially had an uneventful postoperative course but overnight developed increase in bloody drainage from her JP. I stopped her Lovenox and her Toradol and placed the compression dressing. Unfortunately she continued to ooze slowly from her JP and I decided to take her back to the OR for exploration of all mastectomy site. Intraoperatively there was no signs of active bleeding but there was moderate to large hematoma in the medial aspect. She recover well and was kept for another 2 nights in the hospital. Her hemoglobin did drop to 6.7 but she was asymptomatic with no signs of hemodynamic compromise. I discussed with her about the risk, benefits and possible complication of transfusions and also discussed with her the option of treating this with observation and iron supplementation. Given the fact that she is doing well and doesn't have any hemodynamic repercussion I do think that is in her best interest to avoid transfusion. At the time of discharge she was ambulating, tolerating a regular diet. Her vital signs were stable and she was afebrile. Her physical exam showed a female in no acute distress, good spirits and joking. Chest with the mastectomy site healing well without evidence of infection or expanding hematoma. There was some and serosanguineous drainage in the JP with significant improvement. There was no evidence of lymphedema or infection. Her abdomen was soft, nontender with no peritonitis. Extremities: No edema and no evidence of  lymphedema and there were well-perfused. Condition of the patient the time of discharge is stable she will have a follow-up appointment next week with me with a repeat hemoglobin .    Disposition: 01-Home or Self Care  Discharge Instructions    Call MD for:  difficulty breathing, headache or visual disturbances    Complete by:  As directed    Call MD for:  extreme fatigue    Complete by:  As directed    Call MD for:  hives    Complete by:  As directed    Call MD for:  persistant dizziness or light-headedness    Complete by:  As directed    Call MD for:  persistant nausea and vomiting    Complete by:  As directed    Call MD for:  redness, tenderness, or signs of infection (pain, swelling, redness, odor or green/yellow discharge around incision site)    Complete by:  As directed    Call MD for:  severe uncontrolled pain    Complete by:  As directed    Call MD for:  temperature >100.4    Complete by:  As directed    Diet - low sodium heart healthy    Complete by:  As directed    Discharge instructions    Complete by:  As directed    Please teach Pt about JP care. Shower tomorrow. May place ABD pads and sports bras. Instruct pt to avoid NSAIDS and ASA   Discharge wound care:    Complete by:  As  directed    Place sponge drain on JP site daily   Increase activity slowly    Complete by:  As directed    Lifting restrictions    Complete by:  As directed    20 lbs x 6 wks     Allergies as of 07/31/2016      Reactions   Lipitor [atorvastatin] Other (See Comments)   Arthalgia   Lipofen [fenofibrate] Other (See Comments)   Arthalgia   Mevacor [lovastatin] Other (See Comments)   Arthalgia   Zetia [ezetimibe] Other (See Comments)   Arthalgia   Zocor [simvastatin] Other (See Comments)   Arthalgia      Medication List    STOP taking these medications   aspirin EC 81 MG tablet   meloxicam 15 MG tablet Commonly known as:  MOBIC     TAKE these medications   amLODipine 5 MG  tablet Commonly known as:  NORVASC Take 1 tablet by mouth daily.   atenolol 50 MG tablet Commonly known as:  TENORMIN Take 50 mg by mouth daily.   clobetasol cream 0.05 % Commonly known as:  TEMOVATE Apply 1 application topically 2 (two) times daily as needed.   docusate sodium 100 MG capsule Commonly known as:  COLACE Take 1 capsule (100 mg total) by mouth 2 (two) times daily.   ferrous sulfate 325 (65 FE) MG tablet Take 1 tablet (325 mg total) by mouth 2 (two) times daily with a meal.   fluticasone 50 MCG/ACT nasal spray Commonly known as:  FLONASE Place 1 spray into both nostrils daily as needed for allergies.   HYDROcodone-acetaminophen 5-325 MG tablet Commonly known as:  NORCO/VICODIN Take 1-2 tablets by mouth every 6 (six) hours as needed for moderate pain.   lisinopril 10 MG tablet Commonly known as:  PRINIVIL,ZESTRIL Take 10 mg by mouth daily.   metroNIDAZOLE 0.75 % cream Commonly known as:  METROCREAM Apply 1 application topically 2 (two) times daily.   Vitamin D 2000 units Caps Take 1 capsule by mouth daily.      Follow-up [place], MD Follow up in 1 week(s).   Specialty:  General Surgery Contact information: [place] [address] [phone]            Caroleen Hamman, MD FACS

## 2016-07-31 NOTE — Progress Notes (Signed)
Patient discharged to home. IV site removed. Concerns addressed. Discharge and drain sheet provided to patient.

## 2016-08-03 LAB — SURGICAL PATHOLOGY

## 2016-08-07 ENCOUNTER — Encounter: Payer: Self-pay | Admitting: Surgery

## 2016-08-07 ENCOUNTER — Ambulatory Visit (INDEPENDENT_AMBULATORY_CARE_PROVIDER_SITE_OTHER): Payer: Medicare Other | Admitting: Surgery

## 2016-08-07 ENCOUNTER — Other Ambulatory Visit
Admission: RE | Admit: 2016-08-07 | Discharge: 2016-08-07 | Disposition: A | Discharge status: Home / Self Care | Payer: Medicare Other | Source: Ambulatory Visit | Attending: Surgery | Admitting: Surgery

## 2016-08-07 VITALS — BP 190/78 | HR 78 | Temp 98.7°F | Ht 64.0 in | Wt 161.4 lb

## 2016-08-07 DIAGNOSIS — Z09 Encounter for follow-up examination after completed treatment for conditions other than malignant neoplasm: Secondary | ICD-10-CM

## 2016-08-07 DIAGNOSIS — R58 Hemorrhage, not elsewhere classified: Secondary | ICD-10-CM | POA: Diagnosis present

## 2016-08-07 LAB — CBC WITH DIFFERENTIAL/PLATELET
Basophils Absolute: 0.1 10*3/uL (ref 0–0.1)
Basophils Relative: 1 %
Eosinophils Absolute: 0.2 10*3/uL (ref 0–0.7)
Eosinophils Relative: 2 %
HCT: 25.1 % — ABNORMAL LOW (ref 35.0–47.0)
Hemoglobin: 8.7 g/dL — ABNORMAL LOW (ref 12.0–16.0)
LYMPHS ABS: 2.3 10*3/uL (ref 1.0–3.6)
LYMPHS PCT: 21 %
MCH: 30 pg (ref 26.0–34.0)
MCHC: 34.7 g/dL (ref 32.0–36.0)
MCV: 86.5 fL (ref 80.0–100.0)
Monocytes Absolute: 1.4 10*3/uL — ABNORMAL HIGH (ref 0.2–0.9)
Monocytes Relative: 13 %
Neutro Abs: 6.9 10*3/uL — ABNORMAL HIGH (ref 1.4–6.5)
Neutrophils Relative %: 63 %
PLATELETS: 413 10*3/uL (ref 150–440)
RBC: 2.9 MIL/uL — ABNORMAL LOW (ref 3.80–5.20)
RDW: 15.2 % — ABNORMAL HIGH (ref 11.5–14.5)
WBC: 10.9 10*3/uL (ref 3.6–11.0)

## 2016-08-07 NOTE — Progress Notes (Signed)
S/p simple mastectomy SLNB complicated by retained hematoma s/p evacuation Doing well No complaints this am Hb improving JP 20cc each a day. serosanguineous Path reviewed w pt  PE NAD Flaps intact, no evidence of necrosis, wound infection, seroma or hematomas. One JP removed  A/P Doing well RTC next week hopefully the other JP can be removed F/U oncology Continue Iron

## 2016-08-07 NOTE — Patient Instructions (Signed)
Please continue to measure your output as you have been.   Since we have pulled one of the drains today, expect for your drain output in the other drain to go up for the next several days. This is normal. Keep a dry dressing over the wound until this stops draining. This may take 3-4 days.  Please follow-up in our office next week as scheduled.  Please call with any questions or concerns that you may have prior to your appointment.

## 2016-08-13 ENCOUNTER — Encounter: Payer: Self-pay | Admitting: Surgery

## 2016-08-13 ENCOUNTER — Ambulatory Visit (INDEPENDENT_AMBULATORY_CARE_PROVIDER_SITE_OTHER): Payer: Medicare Other | Admitting: Surgery

## 2016-08-13 VITALS — BP 175/73 | HR 58 | Temp 98.6°F | Ht 64.0 in | Wt 161.8 lb

## 2016-08-13 DIAGNOSIS — C50919 Malignant neoplasm of unspecified site of unspecified female breast: Secondary | ICD-10-CM

## 2016-08-13 DIAGNOSIS — Z09 Encounter for follow-up examination after completed treatment for conditions other than malignant neoplasm: Secondary | ICD-10-CM

## 2016-08-13 HISTORY — DX: Malignant neoplasm of unspecified site of unspecified female breast: C50.919

## 2016-08-13 NOTE — Patient Instructions (Signed)
Continue to keep bulb empty and collapsed at all times. If you notice that the bulb is not flat, empty the bulb and completely collapse it and then put the top on. If the top will not stay on, you may use tape to help. If you continue to have problems after trying this or notice that you are developing swelling or "puffiness" at the breast site, call our office and speak with Lynn Wood.  If you develop any redness, draining around the drain, or increased pain- also call our office.  We will have you follow-up next week as scheduled below.

## 2016-08-13 NOTE — Progress Notes (Signed)
08/13/2016  HPI: Patient is status post left mastectomy with sentinel lymph node biopsy for high grade DCIS on [id number], complicated by retained hematoma status post evacuation in the operating room. He presents for second follow-up today. On her last visit one of her JP drains was removed without complications. Patient reports that her other JP drain that remains has had low output over the last few days of 2 mL's per day. She also does report that occasionally the bulb will not hold suction. Otherwise denies any fevers, chills, worsening pain over the mastectomy site, drainage from the incision or other issues.  Vital signs: BP (!) 175/73   Pulse (!) 58   Temp 98.6 F (37 C) (Oral)   Ht 5\' 4"  (1.626 m)   Wt 73.4 kg (161 lb 12.8 oz)   BMI 27.77 kg/m    Physical Exam: Constitutional: No acute distress Breast: Left mastectomy incision is clean dry and intact with no evidence of infection no wound breakdown. Her JP drain was stripped and immediately evacuated 45 mL's of fluid that was serosanguineous in nature. There was mild swelling over the left upper chest which decreased with removal of this fluid.  Assessment/Plan: 71 year old female status post left mastectomy with sentinel lymph node biopsy.  -Currently the drain is having a higher volume than expected. This is probably likely due to draining clogging which may have resulted in decrease fluid output and does increased swelling of the subcutaneous space. In light of this we will continue with the drain in place for now. Instructed patient further on emptying the drain and keeping the bulb always on suction and to tape the cap of the bulb to prevent suction from going off. -Patient will follow up next week with Dr. Dahlia Byes for possible drain removal at that time.   Melvyn Neth, [facility]

## 2016-08-19 ENCOUNTER — Encounter: Payer: Self-pay | Admitting: Surgery

## 2016-08-19 ENCOUNTER — Ambulatory Visit (INDEPENDENT_AMBULATORY_CARE_PROVIDER_SITE_OTHER): Payer: Medicare Other | Admitting: Surgery

## 2016-08-19 VITALS — BP 198/95 | HR 58 | Temp 98.3°F | Wt 162.0 lb

## 2016-08-19 DIAGNOSIS — Z09 Encounter for follow-up examination after completed treatment for conditions other than malignant neoplasm: Secondary | ICD-10-CM

## 2016-08-19 NOTE — Progress Notes (Signed)
S/p simple mastectomy SLNB 7/29 complicated by retained hematoma s/p evacuation 2/14 Doing well No complaints this am JP 20cc each a day.  I emptied the drain and it was 30cc for 16 hrs  PE NAD Flaps intact, no evidence of  wound infection, seroma or hematomas. There is a small 2x1 cm on sup flap w a eschar (epidermolysis).  JP serous fluid  A/P Doing well RTC next week to have JP removed if clinically indicated F/U oncology Continue Iron

## 2016-08-19 NOTE — Patient Instructions (Signed)
We will see you next week and hopefully we will remove your drain.  Please give [place] a call in case you have any questions or concerns.

## 2016-08-25 ENCOUNTER — Inpatient Hospital Stay: Payer: Medicare Other | Admitting: Hematology and Oncology

## 2016-08-25 NOTE — Progress Notes (Deleted)
[facility] day:  08/25/2016  Chief Complaint: NICHOLL ONSTOTT is a 71 y.o. female with left breast DCIS who is seen following interval mastectomy with sentinel lymph node biopsy.  HPI:  The patient was last seen in the medical oncology clinic on 07/23/2016.  At that time, she was seen for initial consultation.  MRI guided biopsy revealed DCIS.  She underwent mastectomy on 07/28/2016 by Dr. Caroleen Hamman.  She developed pot-operative hematoma and bleeding.  She returned to the OR on 07/29/2016.  Findings revealed a hematoma on the medial aspect of the mastectomy site with no active bleeders.  Estimated blood loss was 50 cc with 300 cc of clot.  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  Size was 4.4 cm.  Margins were negative.  Two sentinel lymph nodes were negative.  Tumor was ER + (20%) and PR -.  Pathologic stage was pTisN0(sn).     Past Medical History:  Diagnosis Date  . Benign neoplasm of colon   . Cancer ([place]) 06/2016   left breast  . Carpal tunnel syndrome   . DCIS (ductal carcinoma in situ) of breast   . Diabetes mellitus type 2, uncomplicated ([place])   . Hypertension   . Nontoxic uninodular goiter   . Pre-diabetes   . Pure hypercholesterolemia   . Vaginal vault prolapse   . Vitamin D deficiency     Past Surgical History:  Procedure Laterality Date  . COLONOSCOPY WITH PROPOFOL N/A 01/22/2015   Procedure: COLONOSCOPY WITH PROPOFOL;  Surgeon: Lollie Sails, MD;  Location[facility] END[facility]COPY;  Service: Endoscopy;  Laterality: N/A;  . GYNECOLOGIC CRYOSURGERY    . INCISION AND DRAINAGE ABSCESS Left 07/29/2016   Procedure: Reexploration of left breast mastectomy site;  Surgeon: Jules Husbands, MD;  Location: [facility] ORS;  Service: General;  Laterality: Left;  Marland Kitchen MASTECTOMY W/ SENTINEL NODE BIOPSY Left 07/28/2016   Procedure: MASTECTOMY WITH SENTINEL LYMPH NODE BIOPSY;  Surgeon: Jules Husbands, MD;  Location: [facility] ORS;   Service: General;  Laterality: Left;  Marland Kitchen MULTIPLE TOOTH EXTRACTIONS  1997    Family History  Problem Relation Age of Onset  . Breast cancer Cousin   . Colon cancer Mother   . Prostate cancer Father   . Prostate cancer Brother   . Heart disease Daughter     Social History:  reports that she has been smoking Cigarettes.  She has been smoking about 0.00 packs per day. She has never used smokeless tobacco. She reports that she does not drink alcohol or use drugs.  She lives in [place].  She has worked for the past 28 years at [employer].  She has been exposed to ethylene oxide.  The patient is accompanied by her husband, Jenny Reichmann, today.  Allergies:  Allergies  Allergen Reactions  . Lipitor [Atorvastatin] Other (See Comments)    Arthalgia  . Lipofen [Fenofibrate] Other (See Comments)    Arthalgia  . Mevacor [Lovastatin] Other (See Comments)    Arthalgia  . Zetia [Ezetimibe] Other (See Comments)    Arthalgia  . Zocor [Simvastatin] Other (See Comments)    Arthalgia    Current Medications: Current Outpatient Prescriptions  Medication Sig Dispense Refill  . amLODipine (NORVASC) 5 MG tablet Take 1 tablet by mouth daily.    Marland Kitchen atenolol (TENORMIN) 50 MG tablet Take 50 mg by mouth daily.    . Cholecalciferol (VITAMIN D) 2000 units CAPS Take 1 capsule by mouth  daily.    . clobetasol cream (TEMOVATE) 0.25 % Apply 1 application topically 2 (two) times daily as needed.     . docusate sodium (COLACE) 100 MG capsule Take 1 capsule (100 mg total) by mouth 2 (two) times daily. 10 capsule 0  . ferrous sulfate 325 (65 FE) MG tablet Take 1 tablet (325 mg total) by mouth 2 (two) times daily with a meal. 60 tablet 3  . fluticasone (FLONASE) 50 MCG/ACT nasal spray Place 1 spray into both nostrils daily as needed for allergies.     Marland Kitchen lisinopril (PRINIVIL,ZESTRIL) 10 MG tablet Take 10 mg by mouth daily.    . metroNIDAZOLE (METROCREAM) 0.75 % cream Apply 1 application topically 2 (two) times daily.      No current facility-administered medications for this visit.     Review of Systems:  GENERAL:  Feels good.  Active.  No fevers, sweats or weight loss. PERFORMANCE STATUS (ECOG):  0 HEENT:  No visual changes, runny nose, sore throat, mouth sores or tenderness. Lungs: No shortness of breath or cough.  No hemoptysis. Cardiac:  No chest pain, palpitations, orthopnea, or PND. GI:  No nausea, vomiting, diarrhea, constipation, melena or hematochezia. GU:  No urgency, frequency, dysuria, or hematuria. Musculoskeletal:  No back pain.  No joint pain.  No muscle tenderness. Extremities:  No pain or swelling. Skin:  No rashes or skin changes. Neuro:  No headache, numbness or weakness, balance or coordination issues. Endocrine:  No diabetes, thyroid issues, hot flashes or night sweats. Psych:  No mood changes, depression or anxiety. Pain:  No focal pain. Review of systems:  All other systems reviewed and found to be negative.  Physical Exam: There were no vitals taken for this visit. GENERAL:  Well developed, well nourished, sitting comfortably in the exam room in no acute distress. MENTAL STATUS:  Alert and oriented to person, place and time. HEAD:  Normocephalic, atraumatic, face symmetric, no Cushingoid features. EYES:  Pupils equal round and reactive to light and accomodation.  No conjunctivitis or scleral icterus. ENT:  Oropharynx clear without lesion.  Tongue normal. Mucous membranes moist.  RESPIRATORY:  Clear to auscultation without rales, wheezes or rhonchi. CARDIOVASCULAR:  Regular rate and rhythm without murmur, rub or gallop. BREAST:  Right breast without masses, skin changes or nipple discharge.  Left breast with significant fibrocystic changes (as compared to left).  No discrete masses, skin changes or nipple discharge.  Inverted left nipple. ABDOMEN:  Soft, non-tender, with active bowel sounds, and no hepatosplenomegaly.  No masses. SKIN:  No rashes, ulcers or  lesions. EXTREMITIES: No edema, no skin discoloration or tenderness.  No palpable cords. LYMPH NODES: No palpable cervical, supraclavicular, axillary or inguinal adenopathy  NEUROLOGICAL: Unremarkable. PSYCH:  Appropriate.   No visits with results within 3 Day(s) from this visit.  Latest known visit with results is:  Hospital Outpatient Visit on 08/07/2016  Component Date Value Ref Range Status  . WBC 08/07/2016 10.9  3.6 - 11.0 K/uL Final  . RBC 08/07/2016 2.90* 3.80 - 5.20 MIL/uL Final  . Hemoglobin 08/07/2016 8.7* 12.0 - 16.0 g/dL Final  . HCT 08/07/2016 25.1* 35.0 - 47.0 % Final  . MCV 08/07/2016 86.5  80.0 - 100.0 fL Final  . MCH 08/07/2016 30.0  26.0 - 34.0 pg Final  . MCHC 08/07/2016 34.7  32.0 - 36.0 g/dL Final  . RDW 08/07/2016 15.2* 11.5 - 14.5 % Final  . Platelets 08/07/2016 413  150 - 440 K/uL Final  . Neutrophils  Relative % 08/07/2016 63  % Final  . Neutro Abs 08/07/2016 6.9* 1.4 - 6.5 K/uL Final  . Lymphocytes Relative 08/07/2016 21  % Final  . Lymphs Abs 08/07/2016 2.3  1.0 - 3.6 K/uL Final  . Monocytes Relative 08/07/2016 13  % Final  . Monocytes Absolute 08/07/2016 1.4* 0.2 - 0.9 K/uL Final  . Eosinophils Relative 08/07/2016 2  % Final  . Eosinophils Absolute 08/07/2016 0.2  0 - 0.7 K/uL Final  . Basophils Relative 08/07/2016 1  % Final  . Basophils Absolute 08/07/2016 0.1  0 - 0.1 K/uL Final    Assessment:  LARAINA SULTON is a 71 y.o. female with DCIS of the left breast s/p MRI guided biopsy on 07/07/2016.  Pathology revealed high grade ductal carcinoma in situ.  She presented with nipple discharge.  Mammogram and ultrasound revealed no abnormality.   Breast MRI on 06/30/2016 revealed diffuse regional non mass enhancement of the left subareolar and inferior breast, involving approximately 6 cm in anterior to posterior dimension.  There was left nipple inversion with apparent skin thickening in the areolar/subareolar left breast.  There was an indeterminate lymph  node within the axillary tail of the right breast.    Ultrasound of the right axillae on 07/10/2016 revealed a stable to slightly smaller intramammary lymph node (1 cm) compared to prior imaging.  Family history is notable breast cancer (maternal 1st cousin), prostate cancer (father, brother x 2), and colon cancer (mother).  Symptomatically, she denies any complaints.  Exam reveals left nipple inversion and breast asymmetry with significant apparent fibrocystic changes in the left breast  Plan: 1.  Discuss final pathology from mastectomy.  Patient has DCIS.  No evidence of invasive carcinoma.  Discuss no indication for radiation.  Discuss consideration of breast cancer risk reduction (tamoxifen, raloxifene, Arimidex).  Discuss extrapolation from NSABP-B24 trial randomizing patients with DCIS s/p lumpectomy and radiation to placebo or tamoxifen.  Tamoxifen resulted in a decrease in recurrence but not survival.  After approximately 14 years of follow-up, patients who received tamoxifen had a 3.2% absolute risk reduction in contralateral breast cancer.  The cumulative 10 year frequency of invasive and non-invasive breast cancer in the contralateral breast was 6.9% (placebo) and 4.7% (tamoxifen) in the contralateral breast.     diagnosis, staging, and management of breast cancer.  Discuss DCIS (stage 0) breast cancer.  Discuss plans for mastectomy and sentinel lymph node biopsy given large abnormality seen only on breast MRI.  Discuss pathologic review of mastectomy specimen , given large area of involvement, to ensure no evidence of invasive cancer.  Discuss plans for ER/PR testing.  If DCIS is ER positive, discuss use of tamoxifen or aromatase inhibitor for the prevention of invasive and noninvasive contralateral breast cancer.  2.  Discuss consideration of genetic testing given family history.  3.  RTC 2 weeks after mastectomy for review of final pathology and discussion regarding direction of  therapy.   Lequita Asal, MD  08/25/2016, 5:36 AM

## 2016-08-26 ENCOUNTER — Ambulatory Visit (INDEPENDENT_AMBULATORY_CARE_PROVIDER_SITE_OTHER): Payer: Medicare Other | Admitting: Surgery

## 2016-08-26 ENCOUNTER — Encounter: Payer: Self-pay | Admitting: Surgery

## 2016-08-26 VITALS — BP 203/75 | HR 63 | Temp 98.4°F | Ht 64.0 in | Wt 160.6 lb

## 2016-08-26 DIAGNOSIS — Z09 Encounter for follow-up examination after completed treatment for conditions other than malignant neoplasm: Secondary | ICD-10-CM

## 2016-08-26 NOTE — Progress Notes (Signed)
Outpatient postop visit  08/26/2016  Lynn Wood is an 71 y.o. female.    Procedure: Mastectomy  CC: Wants drain removed  HPI: Patient wants her drain removed is putting out 40 cc in 24 hours. She has no other complaints  Medications reviewed.    Physical Exam:  Ht 5\' 4"  (1.626 m)     PE: Wound with minimal eschar however in the center is an area approximately 2-1/2-3 cm with eschar and hypopigmentation. Suspect partial thickness necrosis beneath eschar. No drainage no erythema no purulence  JP drain is serous and removed    Assessment/Plan:  JP drain is removed no sinus seroma. Small area of eschar and probable partial thickness necrosis with hypopigmentation. Recommend utilizing mid derma or vitamin E cream on the scar to soften the eschar. Otherwise patient is doing quite well will follow up with Dr.Pabon  Florene Glen, MD, FACS

## 2016-08-26 NOTE — Patient Instructions (Signed)
We will see you back in 2 weeks. See appointment below.  Keep a dressing over the drain site until it finishes draining. This may take 4-5 days.  If you notice the skin to start to look more full, begins to drain, or if it becomes painful or red, if you develop a fever/chills or nausea/vomiting call our office and speak with a nurse immediately.  Continue your weight restrictions of no more than 20 lbs until you are seen back in the office or told otherwise.  Call our office with any questions or concerns that you may have.

## 2016-09-02 ENCOUNTER — Telehealth: Payer: Self-pay | Admitting: Surgery

## 2016-09-02 NOTE — Telephone Encounter (Signed)
Returned phone call to patient. She states that this morning upon waking, her guaze in her surgical bra was wet with drainage. This continued for approximately 1 hour and now has stopped. The drainage was clear fluid. She denies nausea/vomiting and fever/chills. Denies localized swelling to the area. I explained that I am not surprised with this as drain just came out last week and since everything at this point has stablized and she is not experiencing drainage, we will follow-up in the office tomorrow as previously scheduled.   She is encouraged to call if any symptoms change prior to her appointment. She verbalizes understanding.

## 2016-09-03 ENCOUNTER — Ambulatory Visit (INDEPENDENT_AMBULATORY_CARE_PROVIDER_SITE_OTHER): Payer: Medicare Other | Admitting: Surgery

## 2016-09-03 ENCOUNTER — Encounter: Payer: Self-pay | Admitting: Surgery

## 2016-09-03 VITALS — BP 179/80 | HR 70 | Temp 98.5°F | Ht 65.0 in | Wt 159.0 lb

## 2016-09-03 DIAGNOSIS — Z09 Encounter for follow-up examination after completed treatment for conditions other than malignant neoplasm: Secondary | ICD-10-CM

## 2016-09-03 MED ORDER — CEPHALEXIN 500 MG PO CAPS: 500.0000 mg | ORAL_CAPSULE | Freq: Two times a day (BID) | ORAL | 0 refills | Status: DC

## 2016-09-03 NOTE — Progress Notes (Signed)
09/03/2016  HPI: Patient is left mastectomy on 2/13 with Dr. Adora Fridge, requiring take back to the operating room on 2/14 due to hematoma. She was discharged on 2/16. She was seen last on 3/14 by Dr. Burt Knack, which time her JP drain was removed. She presents today because she is noted some drainage around the midportion of her incision. And feels there is some swelling on the lateral side of her mastectomy flap. Denies any fevers or chills, but has noticed mild redness around the skin.  Vital signs: BP (!) 179/80   Pulse 70   Temp 98.5 F (36.9 C) (Oral)   Ht 5\' 5"  (1.651 m)   Wt 72.1 kg (159 lb)   BMI 26.46 kg/m    Physical Exam: Constitutional: No acute distress Breast: Status post left mastectomy. On the medial one third portion of the wound as a area measuring 3 cm of eschar with no evidence of infection. On the lateral portion of the eschar there is a 5 mm opening in the incision. Probing with a Q-tip reveals small tunneling of the wound underneath the eschar going medially with no significant purulent fluid. The lateral side of her wound was inspected with an ultrasound at bedside and there was noted a small amount of fluid over the superior portion of the flap. There is no significant erythema and only very mild cellulitis. The patient's incision is mildly tender to palpation.  Assessment/Plan: 71 year old female status post left mastectomy.  -Placed a quarter inch iodoform wick within the 5 mm opening of the incision in order to allow for the ureter to drain appropriately. Otherwise no other procedures needed to be done. Recommended the area be Dressed to catch any drainage. Also will prescribe the patient 10 day course of Keflex twice a day given the mild erythema. She will follow up next week with Dr. Dahlia Byes for further evaluation of the wound.   Melvyn Neth, [place]

## 2016-09-03 NOTE — Patient Instructions (Signed)
We will see you back next week as scheduled below.   Start your antibiotics today. If you have any worsening redness, increased pain, or significant swelling- call our office prior to your appointment so we may have you seen by a surgeon.

## 2016-09-07 ENCOUNTER — Inpatient Hospital Stay: Payer: Medicare Other

## 2016-09-07 ENCOUNTER — Inpatient Hospital Stay: Payer: Medicare Other | Attending: Hematology and Oncology | Admitting: Hematology and Oncology

## 2016-09-07 ENCOUNTER — Encounter: Payer: Self-pay | Admitting: Hematology and Oncology

## 2016-09-07 VITALS — BP 173/74 | HR 61 | Temp 94.9°F | Wt 159.2 lb

## 2016-09-07 DIAGNOSIS — D0512 Intraductal carcinoma in situ of left breast: Secondary | ICD-10-CM

## 2016-09-07 DIAGNOSIS — I10 Essential (primary) hypertension: Secondary | ICD-10-CM | POA: Diagnosis not present

## 2016-09-07 DIAGNOSIS — Z575 Occupational exposure to toxic agents in other industries: Secondary | ICD-10-CM

## 2016-09-07 DIAGNOSIS — Z8041 Family history of malignant neoplasm of ovary: Secondary | ICD-10-CM | POA: Insufficient documentation

## 2016-09-07 DIAGNOSIS — F17210 Nicotine dependence, cigarettes, uncomplicated: Secondary | ICD-10-CM | POA: Insufficient documentation

## 2016-09-07 DIAGNOSIS — N8110 Cystocele, unspecified: Secondary | ICD-10-CM

## 2016-09-07 DIAGNOSIS — E559 Vitamin D deficiency, unspecified: Secondary | ICD-10-CM | POA: Diagnosis not present

## 2016-09-07 DIAGNOSIS — E7800 Pure hypercholesterolemia, unspecified: Secondary | ICD-10-CM | POA: Diagnosis not present

## 2016-09-07 DIAGNOSIS — Z170 Estrogen receptor positive status [ER+]: Secondary | ICD-10-CM | POA: Diagnosis not present

## 2016-09-07 DIAGNOSIS — D649 Anemia, unspecified: Secondary | ICD-10-CM

## 2016-09-07 DIAGNOSIS — Z79899 Other long term (current) drug therapy: Secondary | ICD-10-CM | POA: Diagnosis not present

## 2016-09-07 DIAGNOSIS — Z9013 Acquired absence of bilateral breasts and nipples: Secondary | ICD-10-CM | POA: Insufficient documentation

## 2016-09-07 DIAGNOSIS — E119 Type 2 diabetes mellitus without complications: Secondary | ICD-10-CM | POA: Insufficient documentation

## 2016-09-07 DIAGNOSIS — Z86010 Personal history of colonic polyps: Secondary | ICD-10-CM | POA: Diagnosis not present

## 2016-09-07 DIAGNOSIS — E049 Nontoxic goiter, unspecified: Secondary | ICD-10-CM | POA: Insufficient documentation

## 2016-09-07 DIAGNOSIS — Z803 Family history of malignant neoplasm of breast: Secondary | ICD-10-CM | POA: Diagnosis not present

## 2016-09-07 LAB — IRON AND TIBC
Iron: 19 ug/dL — ABNORMAL LOW (ref 28–170)
Saturation Ratios: 6 % — ABNORMAL LOW (ref 10.4–31.8)
TIBC: 336 ug/dL (ref 250–450)
UIBC: 317 ug/dL

## 2016-09-07 LAB — FERRITIN: Ferritin: 59 ng/mL (ref 11–307)

## 2016-09-07 LAB — CBC WITH DIFFERENTIAL/PLATELET
Basophils Absolute: 0 10*3/uL (ref 0–0.1)
Basophils Relative: 0 %
Eosinophils Absolute: 0.1 10*3/uL (ref 0–0.7)
Eosinophils Relative: 1 %
HCT: 33.5 % — ABNORMAL LOW (ref 35.0–47.0)
Hemoglobin: 11.1 g/dL — ABNORMAL LOW (ref 12.0–16.0)
Lymphocytes Relative: 21 %
Lymphs Abs: 1.6 10*3/uL (ref 1.0–3.6)
MCH: 28.3 pg (ref 26.0–34.0)
MCHC: 33.2 g/dL (ref 32.0–36.0)
MCV: 85.2 fL (ref 80.0–100.0)
Monocytes Absolute: 1 10*3/uL — ABNORMAL HIGH (ref 0.2–0.9)
Monocytes Relative: 13 %
Neutro Abs: 5.1 10*3/uL (ref 1.4–6.5)
Neutrophils Relative %: 65 %
Platelets: 369 10*3/uL (ref 150–440)
RBC: 3.93 MIL/uL (ref 3.80–5.20)
RDW: 14.3 % (ref 11.5–14.5)
WBC: 7.8 10*3/uL (ref 3.6–11.0)

## 2016-09-07 NOTE — Progress Notes (Signed)
[facility] day:  09/07/2016  Chief Complaint: Lynn Wood is a 71 y.o. 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  HPI:  The patient was last seen in the medical oncology clinic on 07/23/2016.  At that time, she was seen for initial consultation.  MRI guided biopsy revealed DCIS.  She underwent mastectomy on 07/28/2016 by Dr. Caroleen Hamman.  She developed post-operative hematoma and bleeding.  She returned to the OR on 07/29/2016.  Findings revealed a hematoma on the medial aspect of the mastectomy site with no active bleeders.  Estimated blood loss was 50 cc with 300 cc of clot.  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  Size was 4.4 cm.  Margins were negative.  Two sentinel lymph nodes were negative.  Tumor was ER + (20%) and PR -.  Pathologic stage was pTisN0(sn).  She states that she has an ugly incision with an open wound that has not healed. She is currently on Keflex. She has weekly follow-up. The drain was taken out.  She is taking 1 iron tablet/day.   Past Medical History:  Diagnosis Date  . Benign neoplasm of colon   . Cancer ([place]) 06/2016   left breast  . Carpal tunnel syndrome   . DCIS (ductal carcinoma in situ) of breast   . Diabetes mellitus type 2, uncomplicated ([place])   . Hypertension   . Nontoxic uninodular goiter   . Pre-diabetes   . Pure hypercholesterolemia   . Vaginal vault prolapse   . Vitamin D deficiency     Past Surgical History:  Procedure Laterality Date  . COLONOSCOPY WITH PROPOFOL N/A 01/22/2015   Procedure: COLONOSCOPY WITH PROPOFOL;  Surgeon: Lollie Sails, MD;  Location: [facility] ENDOSCOPY;  Service: Endoscopy;  Laterality: N/A;  . GYNECOLOGIC CRYOSURGERY    . INCISION AND DRAINAGE ABSCESS Left 07/29/2016   Procedure: Reexploration of left breast mastectomy site;  Surgeon: Jules Husbands, MD;  Location: [facility] ORS;   Service: General;  Laterality: Left;  Marland Kitchen MASTECTOMY W/ SENTINEL NODE BIOPSY Left 07/28/2016   Procedure: MASTECTOMY WITH SENTINEL LYMPH NODE BIOPSY;  Surgeon: Jules Husbands, MD;  Location: [facility] ORS;  Service: General;  Laterality: Left;  Marland Kitchen MULTIPLE TOOTH EXTRACTIONS  1997    Family History  Problem Relation Age of Onset  . Breast cancer Cousin   . Colon cancer Mother   . Prostate cancer Father   . Prostate cancer Brother   . Heart disease Daughter     Social History:  reports that she has been smoking Cigarettes.  She has been smoking about 0.00 packs per day. She has never used smokeless tobacco. She reports that she does not drink alcohol or use drugs.  She lives in [place].  She has worked for the past 28 years at [employer].  She has been exposed to ethylene oxide.  The patient is accompanied by her husband, Jenny Reichmann, today.  Allergies:  Allergies  Allergen Reactions  . Lipitor [Atorvastatin] Other (See Comments)    Arthalgia  . Lipofen [Fenofibrate] Other (See Comments)    Arthalgia  . Mevacor [Lovastatin] Other (See Comments)    Arthalgia  . Zetia [Ezetimibe] Other (See Comments)    Arthalgia  . Zocor [Simvastatin] Other (See Comments)    Arthalgia    Current Medications: Current Outpatient Prescriptions  Medication Sig Dispense Refill  . amLODipine (NORVASC) 5 MG tablet  Take 1 tablet by mouth daily.    Marland Kitchen atenolol (TENORMIN) 50 MG tablet Take 50 mg by mouth daily.    . cephALEXin (KEFLEX) 500 MG capsule Take 1 capsule (500 mg total) by mouth 2 (two) times daily. 14 capsule 0  . Cholecalciferol (VITAMIN D) 2000 units CAPS Take 1 capsule by mouth daily.    . clobetasol cream (TEMOVATE) 5.32 % Apply 1 application topically 2 (two) times daily as needed.     . docusate sodium (COLACE) 100 MG capsule Take 1 capsule (100 mg total) by mouth 2 (two) times daily. 10 capsule 0  . ferrous sulfate 325 (65 FE) MG tablet Take 1 tablet (325 mg total) by mouth 2 (two) times daily  with a meal. 60 tablet 3  . fluticasone (FLONASE) 50 MCG/ACT nasal spray Place 1 spray into both nostrils daily as needed for allergies.     Marland Kitchen lisinopril (PRINIVIL,ZESTRIL) 10 MG tablet Take 10 mg by mouth daily.    . metroNIDAZOLE (METROCREAM) 0.75 % cream Apply 1 application topically 2 (two) times daily.     No current facility-administered medications for this visit.     Review of Systems:  GENERAL:  Feels good.  No fevers or sweats.  Weight loss of 5 pounds. PERFORMANCE STATUS (ECOG):  0 HEENT:  No visual changes, runny nose, sore throat, mouth sores or tenderness. Lungs: No shortness of breath or cough.  No hemoptysis. Cardiac:  No chest pain, palpitations, orthopnea, or PND. GI:  No nausea, vomiting, diarrhea, constipation, melena or hematochezia. GU:  No urgency, frequency, dysuria, or hematuria. Musculoskeletal:  No back pain.  No joint pain.  No muscle tenderness. Extremities:  No pain or swelling. Skin:  Surgical wound healing slowly.  Currently on antibiotics.  No rashes or skin changes. Neuro:  No headache, numbness or weakness, balance or coordination issues. Endocrine:  No diabetes, thyroid issues, hot flashes or night sweats. Psych:  No mood changes, depression or anxiety. Pain:  No focal pain. Review of systems:  All other systems reviewed and found to be negative.  Physical Exam: Blood pressure (!) 173/74, pulse 61, temperature (!) 94.9 F (34.9 C), temperature source Tympanic, weight 159 lb 3 oz (72.2 kg). GENERAL:  Well developed, well nourished, woman sitting comfortably in the exam room in no acute distress. MENTAL STATUS:  Alert and oriented to person, place and time. HEAD:  Short gray hair.  Normocephalic, atraumatic, face symmetric, no Cushingoid features. EYES:  Brown eyes.  Pupils equal round and reactive to light and accomodation.  No conjunctivitis or scleral icterus. ENT:  Oropharynx clear without lesion.  Tongue normal.  Dentures.  Mucous membranes  moist.  RESPIRATORY:  Clear to auscultation without rales, wheezes or rhonchi. CARDIOVASCULAR:  Regular rate and rhythm without murmur, rub or gallop. BREAST:  Right breast without masses, skin changes or nipple discharge.  Left mastectomy with irregular 2 1/2 x 4 cm tapered wound hearing by secondary intention.  Eschar. ABDOMEN:  Soft, non-tender, with active bowel sounds, and no hepatosplenomegaly.  No masses. SKIN:  No rashes, ulcers or lesions. EXTREMITIES: No edema, no skin discoloration or tenderness.  No palpable cords. LYMPH NODES: No palpable cervical, supraclavicular, axillary or inguinal adenopathy  NEUROLOGICAL: Unremarkable. PSYCH:  Appropriate.   No visits with results within 3 Day(s) from this visit.  Latest known visit with results is:  Hospital Outpatient Visit on 08/07/2016  Component Date Value Ref Range Status  . WBC 08/07/2016 10.9  3.6 - 11.0 K/uL  Final  . RBC 08/07/2016 2.90* 3.80 - 5.20 MIL/uL Final  . Hemoglobin 08/07/2016 8.7* 12.0 - 16.0 g/dL Final  . HCT 08/07/2016 25.1* 35.0 - 47.0 % Final  . MCV 08/07/2016 86.5  80.0 - 100.0 fL Final  . MCH 08/07/2016 30.0  26.0 - 34.0 pg Final  . MCHC 08/07/2016 34.7  32.0 - 36.0 g/dL Final  . RDW 08/07/2016 15.2* 11.5 - 14.5 % Final  . Platelets 08/07/2016 413  150 - 440 K/uL Final  . Neutrophils Relative % 08/07/2016 63  % Final  . Neutro Abs 08/07/2016 6.9* 1.4 - 6.5 K/uL Final  . Lymphocytes Relative 08/07/2016 21  % Final  . Lymphs Abs 08/07/2016 2.3  1.0 - 3.6 K/uL Final  . Monocytes Relative 08/07/2016 13  % Final  . Monocytes Absolute 08/07/2016 1.4* 0.2 - 0.9 K/uL Final  . Eosinophils Relative 08/07/2016 2  % Final  . Eosinophils Absolute 08/07/2016 0.2  0 - 0.7 K/uL Final  . Basophils Relative 08/07/2016 1  % Final  . Basophils Absolute 08/07/2016 0.1  0 - 0.1 K/uL Final    Assessment:  JEROLINE WOLBERT is a 71 y.o. female with DCIS of the left breast s/p mastectomy on 07/28/2016.  Pathology revealed grade  III ductal carcinoma in situ, comedo type.  Size was 4.4 cm.  Margins were negative.  Two sentinel lymph nodes were negative.  Tumor was ER + (20%) and PR -.  Pathologic stage was pTisN0(sn).  She presented with nipple discharge.  Mammogram and ultrasound revealed no abnormality.  MRI guided biopsy on 07/07/2016 revealed high grade ductal carcinoma in situ.    Breast MRI on 06/30/2016 revealed diffuse regional non mass enhancement of the left subareolar and inferior breast, involving approximately 6 cm in anterior to posterior dimension.  There was left nipple inversion with apparent skin thickening in the areolar/subareolar left breast.  There was an indeterminate lymph node within the axillary tail of the right breast.    Ultrasound of the right axillae on 07/10/2016 revealed a stable to slightly smaller intramammary lymph node (1 cm) compared to prior imaging.  Family history is notable breast cancer (maternal 1st cousin), prostate cancer (father, brother x 2), and colon cancer (mother).  She developed post-operative hematoma and bleeding.  She has a chest wall wound healing by secondary intention.  Patient has anemia post-op.  Plan: 1.  Discuss final pathology from mastectomy.  Patient has DCIS.  No evidence of invasive carcinoma.  Discuss no indication for radiation.  Discuss consideration of breast cancer risk reduction (tamoxifen, raloxifene, Arimidex).  Discuss extrapolation from NSABP-B24 trial randomizing patients with DCIS s/p lumpectomy and radiation to placebo or tamoxifen.  Tamoxifen resulted in a decrease in recurrence but not survival.  After approximately 14 years of follow-up, patients who received tamoxifen had a 3.2% absolute risk reduction in contralateral breast cancer.  The cumulative 10 year frequency of invasive and non-invasive breast cancer in the contralateral breast was 6.9% (placebo) and 4.7% (tamoxifen) in the contralateral breast. 2.  Labs today:  CBC with diff,  ferritin, iron studies. 3.  Discuss consideration of genetic testing given family history.  Patient would like to know about cost. 4.  Discuss taking OJ or vitamin C with oral iron. 5.  RTC in 6 months for MD assessment, labs (CBC with diff, CMP).   Lequita Asal, MD  09/07/2016, 10:01 AM

## 2016-09-07 NOTE — Progress Notes (Signed)
Patient states she has an open wound where she had surgery on her left breast.  She is currently on Keflex.

## 2016-09-09 ENCOUNTER — Encounter: Payer: Self-pay | Admitting: Surgery

## 2016-09-09 ENCOUNTER — Ambulatory Visit (INDEPENDENT_AMBULATORY_CARE_PROVIDER_SITE_OTHER): Payer: Medicare Other | Admitting: Surgery

## 2016-09-09 ENCOUNTER — Telehealth: Payer: Self-pay

## 2016-09-09 VITALS — BP 187/77 | HR 58 | Temp 98.2°F | Ht 65.0 in | Wt 160.6 lb

## 2016-09-09 DIAGNOSIS — Z09 Encounter for follow-up examination after completed treatment for conditions other than malignant neoplasm: Secondary | ICD-10-CM

## 2016-09-09 MED ORDER — OXYCODONE-ACETAMINOPHEN 5-325 MG PO TABS: 1.0000 | ORAL_TABLET | ORAL | 0 refills | Status: DC | PRN

## 2016-09-09 MED ORDER — CIPROFLOXACIN HCL 500 MG PO TABS: 500.0000 mg | ORAL_TABLET | Freq: Two times a day (BID) | ORAL | 0 refills | Status: DC

## 2016-09-09 MED ORDER — GABAPENTIN 300 MG PO CAPS: 300.0000 mg | ORAL_CAPSULE | Freq: Three times a day (TID) | ORAL | 0 refills | Status: DC

## 2016-09-09 NOTE — Telephone Encounter (Signed)
Spoke with patient at this time to let her know I would be faxing over the information to [facility] for weekly visits. We discussed the visits most likely would not begin until next week due to Friday being a Holiday.  Patient was instructed to pack and change the dressing twice daily until home health care begins care. Patient was instructed to call office if she is in need of anything. Patient verbalized understanding.

## 2016-09-09 NOTE — Patient Instructions (Signed)
We will call [facility] . Someone from their office will call you to arrange the appointment times. Please see your follow up appointment listed below.Please call our office if you have concerns or questions.

## 2016-09-10 NOTE — Progress Notes (Signed)
s/p right mastectomy  Comes for a wound check, notices some more pain  HB 11  PE NAD Wound with a small area 2.5 cms x 1 cms on mid portion with necrosis , I debrided the area with scissors . No evidence of abscess or necrotizing infection. Placed packing in wound.  A/P Now w small opening midportion of mastectomy scar from flap necrosis, this is localized and I have debrided the area.  Prescribe short course of A/BS  BID wet/dry RTC next week Prescription given for pain

## 2016-09-14 ENCOUNTER — Telehealth: Payer: Self-pay

## 2016-09-14 NOTE — Telephone Encounter (Signed)
Home Health Nursing Order, Demographics, and Insurance Information faxed to [facility] on 09/10/16 at 1630pm.

## 2016-09-14 NOTE — Telephone Encounter (Signed)
Lynn Wood try [facility].

## 2016-09-14 NOTE — Telephone Encounter (Signed)
Spoke with Patient at this time to let her know I have faxed all the information to [facility] at this time with confirmation received.  I let patient know someone should reach out to her in a few days, however if she has not heard from them to call us back. Patient has a follow up appointment with Dr.Pabon on 09/18/16. She stated she is currently doing the wet to dry dressing changes herself without difficulty twice daily.  She stated the wound is draining, however it has not increased, and that there is a little improvement. She is taking the Antibiotic also. She was instructed to call office if she had any signs or symptoms of infections. Patient verbalized understanding.

## 2016-09-14 NOTE — Telephone Encounter (Signed)
Eula Fried' direct line is [phone], should you have any questions for her

## 2016-09-14 NOTE — Telephone Encounter (Signed)
[name] called on 09/11/16 and left a voicemail regarding this patient. We sent an order for this [facility]. [facility] are [facility] not accepting [facility]. It's the patient's insurance plan. Please send orders to a different home care for the patient.

## 2016-09-15 ENCOUNTER — Telehealth: Payer: Self-pay

## 2016-09-15 NOTE — Telephone Encounter (Signed)
I spoke with [facility] at this time, however they are unable to staff the home care at this time. I will notify Dr.Pabon for further instructions.  Also reached out to [facility] and they do not accept [facility].  I left a message for patient to call office regarding the above information.

## 2016-09-15 NOTE — Telephone Encounter (Signed)
I spoke with Lynn Wood regarding Home Health care ( please see previous note) and she stated she is comfortable with wound dressings as she has already been doing this and she does not see the need for there services at this time. Patient is scheduled to see Dr.Pabon 09/18/16. She was asked to call office if she had questions or concerns. I offered additional supplies, however she stated she had enough and would pick up more when in the office on 09/18/16. I let her know I would send a message to Dr.Pabon to update him on her status and the situation with home health care.

## 2016-09-18 ENCOUNTER — Ambulatory Visit (INDEPENDENT_AMBULATORY_CARE_PROVIDER_SITE_OTHER): Payer: Medicare Other | Admitting: Surgery

## 2016-09-18 ENCOUNTER — Encounter: Payer: Self-pay | Admitting: Surgery

## 2016-09-18 VITALS — BP 192/78 | HR 54 | Temp 97.8°F | Ht 65.0 in | Wt 165.4 lb

## 2016-09-18 DIAGNOSIS — Z09 Encounter for follow-up examination after completed treatment for conditions other than malignant neoplasm: Secondary | ICD-10-CM

## 2016-09-18 MED ORDER — OXYCODONE-ACETAMINOPHEN 5-325 MG PO TABS: 1.0000 | ORAL_TABLET | Freq: Four times a day (QID) | ORAL | 0 refills | Status: DC | PRN

## 2016-09-18 NOTE — Patient Instructions (Signed)
Please STOP taking your Gabapentin.  Continue taking your antibiotic until it is gone. I do not think you need a refill on the antibiotic at this time.  We have given you a refill on your Percocet. Use this conservatively. Do not take additional Tylenol with this medication. However, we would like you to begin using 2 tablets of Aleve every 12 hours around the clock to help with the inflammation.  Switch from your surgical bra to a supportive sports bra to reduce compression to this area.  Also change your dressing technique to only using dry guaze in and on top of the wound. Continue to repack twice daily.  We will see you back in 1 week. See appointment below.  Call me with any questions or concerns prior to your appointment.

## 2016-09-18 NOTE — Progress Notes (Signed)
s/p mastectomy Small area of superior flap necrosed She is doing better Doing BID wet/dry c/o some balance issues  PE NAd Small open wound 3x1cm healing well, good granulation no infection or un drained collections  A/p  Doing well DC gabapentin for now NSAIDS and short course narcotic BID dry dressing Change to sport bras F/U next week

## 2016-09-21 ENCOUNTER — Encounter: Payer: Self-pay | Admitting: Surgery

## 2016-09-23 ENCOUNTER — Encounter: Payer: Self-pay | Admitting: Surgery

## 2016-09-23 ENCOUNTER — Ambulatory Visit (INDEPENDENT_AMBULATORY_CARE_PROVIDER_SITE_OTHER): Payer: Medicare Other | Admitting: Surgery

## 2016-09-23 VITALS — BP 192/68 | HR 73 | Temp 98.1°F | Ht 65.0 in | Wt 161.0 lb

## 2016-09-23 DIAGNOSIS — Z09 Encounter for follow-up examination after completed treatment for conditions other than malignant neoplasm: Secondary | ICD-10-CM

## 2016-09-23 NOTE — Patient Instructions (Signed)
We would like to see you on 10/05/16 at 3:15 pm. Please continue to pack the wound and place a dry dressing over it and wear your bra to secure it.   Please call our office if you have questions or concerns.

## 2016-09-23 NOTE — Progress Notes (Signed)
s/p mastectomy Small area of superior flap necrosed She is doing better Doing BID wet/dry   PE NAd Small open wound 2cmx25mm healing well, good granulation no infection or un drained collections. I performed a bedside U/S because she was concerned about a potential seroma. I did not see any drainable collection. On exam there is no infection, there is no induration and some slight swelling.  A/p  Doing well BID dry dressing F/U 10 days D/w her in detail that this will take a few more weeks to heal, she is frustrated but understanding.

## 2016-10-05 ENCOUNTER — Ambulatory Visit
Admission: RE | Admit: 2016-10-05 | Discharge: 2016-10-05 | Disposition: A | Discharge status: Home / Self Care | Payer: Medicare Other | Source: Ambulatory Visit | Attending: Surgery | Admitting: Surgery

## 2016-10-05 ENCOUNTER — Ambulatory Visit (INDEPENDENT_AMBULATORY_CARE_PROVIDER_SITE_OTHER): Payer: Medicare Other | Admitting: Surgery

## 2016-10-05 VITALS — Ht 65.0 in

## 2016-10-05 DIAGNOSIS — M79605 Pain in left leg: Secondary | ICD-10-CM

## 2016-10-05 NOTE — Patient Instructions (Signed)
Please go to the medical mall for your ultrasound at this time.   Directions to Medical Mall: When leaving our office, go right. Go all of the way down to the very end of the hallway. You will have a purple wall in front of you. You will now have a tunnel to the hospital on your left hand side. Go through this tunnel and the elevators will be on your left. Go down to the 1st floor and take a slight left. The very first desk on the right hand side is the registration desk.   We will call you with the results as soon as they are available.   Please follow up in 3 weeks as scheduled below.  We will call you with an updated appointment from Dr. Ellison Hughs as soon as we speak with his staff.

## 2016-10-05 NOTE — Progress Notes (Signed)
s/p left mastectomy  Edema has improved Main complaint today is left leg pain and weakness Doing dry dressing to small wound on her left chest wall  PE  NAD 0.5cmx 0.5cm defect good granulation tissue, no infection. There is radial scar and contraction related to her scar. No cellulitis or infection  A/p Doing well from mastectomy perspective No infection Concern for DVT, order U/S F/U 2-3 wks Referral to plastic for scar revision in 6 months or so.

## 2016-10-07 ENCOUNTER — Telehealth: Payer: Self-pay

## 2016-10-07 NOTE — Telephone Encounter (Signed)
Spoke with Dr. Dahlia Byes regarding negative Venous Ultrasound. Surgeon would like for patient to schedule appointment with Dr. Ellison Hughs.  Spoke with patient. Results were reviewed and recommendations given. She asked that we move up appointment to see PCP, Dr. Ellison Hughs. [phone].

## 2016-10-07 NOTE — Telephone Encounter (Signed)
Called Dr. Ellison Hughs at this time to get patient an earlier appointment regarding leg pain. Was able to get her appointment changed to May 2nd at 4:00.   Called patient at this time to inform her of her new appointment with Dr. Ellison Hughs on May 2nd at 4:00. Patient verbalized understanding.

## 2016-10-27 ENCOUNTER — Ambulatory Visit (INDEPENDENT_AMBULATORY_CARE_PROVIDER_SITE_OTHER): Payer: Medicare Other | Admitting: Surgery

## 2016-10-27 ENCOUNTER — Encounter: Payer: Self-pay | Admitting: Surgery

## 2016-10-27 VITALS — HR 74 | Temp 98.9°F | Ht 65.0 in | Wt 163.0 lb

## 2016-10-27 DIAGNOSIS — Z09 Encounter for follow-up examination after completed treatment for conditions other than malignant neoplasm: Secondary | ICD-10-CM

## 2016-10-27 NOTE — Patient Instructions (Signed)
We will see you back in 2 months to see how you are progressing and once again discuss if you would like to see a plastic surgeon for reconstruction.

## 2016-10-27 NOTE — Progress Notes (Signed)
s/p left mastectomy  U/S LE no evidence of DVT Mild intermittent and ocasional " shooting" pains, they are actually improving   Doing dry dressing to small wound on her left chest wall  PE  NAD There is radial scar and contraction related to her scar. There is some deformity related to her radial scar No open wounds No cellulitis or infection  A/p Doing well from mastectomy perspective No infection I offered her referal to plastics for reconstruction secondary to scar contracture, she wishes to wait at this time No general surgery issues at this time RTC 2 months

## 2016-12-24 ENCOUNTER — Ambulatory Visit (INDEPENDENT_AMBULATORY_CARE_PROVIDER_SITE_OTHER): Payer: Medicare Other | Admitting: Surgery

## 2016-12-24 ENCOUNTER — Encounter: Payer: Self-pay | Admitting: Surgery

## 2016-12-24 VITALS — BP 155/70 | HR 55 | Temp 98.1°F | Ht 65.0 in | Wt 167.6 lb

## 2016-12-24 DIAGNOSIS — Z09 Encounter for follow-up examination after completed treatment for conditions other than malignant neoplasm: Secondary | ICD-10-CM

## 2016-12-24 NOTE — Progress Notes (Signed)
S/p left mastectomy  Some ocasional mild pain No drainage  PE NAD Wound healed, no infection or un drained pockets. Contrature scar  A/p Doing well D/w her about plastics for scar revision She will call us when she is ready No surgical issues at this time F/u Onc as scheduled

## 2016-12-24 NOTE — Patient Instructions (Signed)
Please call our office if you decide to see a Plastic Surgeon and we can send the referral.  Please call our office with questions or concerns.

## 2016-12-25 ENCOUNTER — Telehealth: Payer: Self-pay

## 2016-12-25 DIAGNOSIS — D0512 Intraductal carcinoma in situ of left breast: Secondary | ICD-10-CM

## 2016-12-25 DIAGNOSIS — D0592 Unspecified type of carcinoma in situ of left breast: Secondary | ICD-10-CM

## 2016-12-25 NOTE — Telephone Encounter (Signed)
error 

## 2016-12-28 NOTE — Telephone Encounter (Signed)
Patient notified of Mammogram appointment on 06/23/2017 @ 9:20 am. Reminder mailed today.

## 2017-01-21 ENCOUNTER — Telehealth: Payer: Self-pay

## 2017-01-21 NOTE — Telephone Encounter (Signed)
Patient notified of 06/28/16 @ 11:15 am appointment with Dr.Pabon following mammogram 06/23/16.  Reminder mailed also.

## 2017-03-15 ENCOUNTER — Inpatient Hospital Stay (HOSPITAL_BASED_OUTPATIENT_CLINIC_OR_DEPARTMENT_OTHER): Payer: Medicare Other | Admitting: Hematology and Oncology

## 2017-03-15 ENCOUNTER — Inpatient Hospital Stay: Payer: Medicare Other | Attending: Hematology and Oncology

## 2017-03-15 ENCOUNTER — Other Ambulatory Visit: Payer: Self-pay | Admitting: *Deleted

## 2017-03-15 ENCOUNTER — Encounter: Payer: Self-pay | Admitting: Hematology and Oncology

## 2017-03-15 VITALS — BP 201/69 | HR 50 | Temp 96.4°F | Wt 169.8 lb

## 2017-03-15 DIAGNOSIS — F17210 Nicotine dependence, cigarettes, uncomplicated: Secondary | ICD-10-CM | POA: Insufficient documentation

## 2017-03-15 DIAGNOSIS — D649 Anemia, unspecified: Secondary | ICD-10-CM

## 2017-03-15 DIAGNOSIS — Z803 Family history of malignant neoplasm of breast: Secondary | ICD-10-CM | POA: Diagnosis not present

## 2017-03-15 DIAGNOSIS — E7800 Pure hypercholesterolemia, unspecified: Secondary | ICD-10-CM | POA: Diagnosis not present

## 2017-03-15 DIAGNOSIS — M129 Arthropathy, unspecified: Secondary | ICD-10-CM | POA: Insufficient documentation

## 2017-03-15 DIAGNOSIS — E559 Vitamin D deficiency, unspecified: Secondary | ICD-10-CM | POA: Insufficient documentation

## 2017-03-15 DIAGNOSIS — E119 Type 2 diabetes mellitus without complications: Secondary | ICD-10-CM | POA: Diagnosis not present

## 2017-03-15 DIAGNOSIS — Z8042 Family history of malignant neoplasm of prostate: Secondary | ICD-10-CM

## 2017-03-15 DIAGNOSIS — I10 Essential (primary) hypertension: Secondary | ICD-10-CM

## 2017-03-15 DIAGNOSIS — Z800 Family history of malignant neoplasm of digestive organs: Secondary | ICD-10-CM

## 2017-03-15 DIAGNOSIS — N8110 Cystocele, unspecified: Secondary | ICD-10-CM

## 2017-03-15 DIAGNOSIS — Z9012 Acquired absence of left breast and nipple: Secondary | ICD-10-CM | POA: Insufficient documentation

## 2017-03-15 DIAGNOSIS — Z79899 Other long term (current) drug therapy: Secondary | ICD-10-CM

## 2017-03-15 DIAGNOSIS — Z853 Personal history of malignant neoplasm of breast: Secondary | ICD-10-CM

## 2017-03-15 DIAGNOSIS — Z8719 Personal history of other diseases of the digestive system: Secondary | ICD-10-CM

## 2017-03-15 DIAGNOSIS — Z170 Estrogen receptor positive status [ER+]: Secondary | ICD-10-CM | POA: Diagnosis not present

## 2017-03-15 DIAGNOSIS — D0512 Intraductal carcinoma in situ of left breast: Secondary | ICD-10-CM

## 2017-03-15 LAB — COMPREHENSIVE METABOLIC PANEL
ALT: 10 U/L — ABNORMAL LOW (ref 14–54)
AST: 18 U/L (ref 15–41)
Albumin: 3.8 g/dL (ref 3.5–5.0)
Alkaline Phosphatase: 86 U/L (ref 38–126)
Anion gap: 7 (ref 5–15)
BUN: 20 mg/dL (ref 6–20)
CO2: 25 mmol/L (ref 22–32)
Calcium: 9.1 mg/dL (ref 8.9–10.3)
Chloride: 108 mmol/L (ref 101–111)
Creatinine, Ser: 0.6 mg/dL (ref 0.44–1.00)
GFR calc Af Amer: >60 mL/min (ref >60)
GFR calc non Af Amer: >60 mL/min (ref >60)
Glucose, Bld: 97 mg/dL (ref 65–99)
Potassium: 3.5 mmol/L (ref 3.5–5.1)
Sodium: 140 mmol/L (ref 135–145)
Total Bilirubin: 0.6 mg/dL (ref 0.3–1.2)
Total Protein: 7.1 g/dL (ref 6.5–8.1)

## 2017-03-15 LAB — CBC WITH DIFFERENTIAL/PLATELET
Basophils Absolute: 0.1 10*3/uL (ref 0–0.1)
Basophils Relative: 1 %
Eosinophils Absolute: 0.4 10*3/uL (ref 0–0.7)
Eosinophils Relative: 7 %
HCT: 35.5 % (ref 35.0–47.0)
Hemoglobin: 12.1 g/dL (ref 12.0–16.0)
Lymphocytes Relative: 37 %
Lymphs Abs: 2 10*3/uL (ref 1.0–3.6)
MCH: 28.7 pg (ref 26.0–34.0)
MCHC: 34.1 g/dL (ref 32.0–36.0)
MCV: 84.3 fL (ref 80.0–100.0)
Monocytes Absolute: 0.6 10*3/uL (ref 0.2–0.9)
Monocytes Relative: 11 %
Neutro Abs: 2.4 10*3/uL (ref 1.4–6.5)
Neutrophils Relative %: 44 %
Platelets: 254 10*3/uL (ref 150–440)
RBC: 4.22 MIL/uL (ref 3.80–5.20)
RDW: 14.3 % (ref 11.5–14.5)
WBC: 5.4 10*3/uL (ref 3.6–11.0)

## 2017-03-15 LAB — FERRITIN: Ferritin: 23 ng/mL (ref 11–307)

## 2017-03-15 NOTE — Progress Notes (Signed)
[facility] day:  03/15/2017  Chief Complaint: Lynn Wood is a 71 y.o. 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  HPI:  The patient was last seen in the medical oncology clinic on 09/07/2016.  At that time, she was recovering from her mastectomy. She developed a post-operative hematoma and bleeding.  She had a chest wall wound healing by secondary intention. She had developed anemia post-op.  Hematocrit was 33.5.  Ferritin was 59 with an iron saturation of 6%.  We discussed oral iron.  We discussed consideration of tamoxifen.  During the interim, patient doing well overall. Patient does complain of arthritic pain in her bilateral knee. Patient denies any B symptoms. She has had no weight loss. She is not taking her oral iron supplements on a regular basis.  BP elevated at 209/77. Patient did not take her medication today.   Past Medical History:  Diagnosis Date  . Benign neoplasm of colon   . Cancer ([place]) 06/2016   left breast  . Carpal tunnel syndrome   . DCIS (ductal carcinoma in situ) of breast   . Diabetes mellitus type 2, uncomplicated ([place])   . Hypertension   . Nontoxic uninodular goiter   . Pre-diabetes   . Pure hypercholesterolemia   . Vaginal vault prolapse   . Vitamin D deficiency     Past Surgical History:  Procedure Laterality Date  . COLONOSCOPY WITH PROPOFOL N/A 01/22/2015   Procedure: COLONOSCOPY WITH PROPOFOL;  Surgeon: Lollie Sails, MD;  Location: [facility] ENDOSCOPY;  Service: Endoscopy;  Laterality: N/A;  . GYNECOLOGIC CRYOSURGERY    . INCISION AND DRAINAGE ABSCESS Left 07/29/2016   Procedure: Reexploration of left breast mastectomy site;  Surgeon: Jules Husbands, MD;  Location: [facility] ORS;  Service: General;  Laterality: Left;  Marland Kitchen MASTECTOMY W/ SENTINEL NODE BIOPSY Left 07/28/2016   Procedure: MASTECTOMY WITH SENTINEL LYMPH NODE BIOPSY;  Surgeon: Jules Husbands, MD;  Location: [facility] ORS;  Service:  General;  Laterality: Left;  Marland Kitchen MULTIPLE TOOTH EXTRACTIONS  1997    Family History  Problem Relation Age of Onset  . Breast cancer Cousin   . Colon cancer Mother   . Prostate cancer Father   . Prostate cancer Brother   . Heart disease Daughter     Social History:  reports that she has been smoking Cigarettes.  She has been smoking about 0.00 packs per day. She has never used smokeless tobacco. She reports that she does not drink alcohol or use drugs.  She lives in [place].  She has worked for the past 28 years at [employer].  She has been exposed to ethylene oxide.  The patient is accompanied by her husband, Jenny Reichmann, today.  Allergies:  Allergies  Allergen Reactions  . Lipitor [Atorvastatin] Other (See Comments)    Arthalgia  . Lipofen [Fenofibrate] Other (See Comments)    Arthalgia  . Mevacor [Lovastatin] Other (See Comments)    Arthalgia  . Zetia [Ezetimibe] Other (See Comments)    Arthalgia  . Zocor [Simvastatin] Other (See Comments)    Arthalgia    Current Medications: Current Outpatient Prescriptions  Medication Sig Dispense Refill  . amLODipine (NORVASC) 5 MG tablet Take 1 tablet by mouth daily.    Marland Kitchen atenolol (TENORMIN) 50 MG tablet Take 50 mg by mouth daily.    . Cholecalciferol (VITAMIN D) 2000 units CAPS Take 1 capsule by mouth daily.    . clobetasol  cream (TEMOVATE) 7.10 % Apply 1 application topically 2 (two) times daily as needed.     . docusate sodium (COLACE) 100 MG capsule Take 1 capsule (100 mg total) by mouth 2 (two) times daily. 10 capsule 0  . fluticasone (FLONASE) 50 MCG/ACT nasal spray Place 1 spray into both nostrils daily as needed for allergies.     Marland Kitchen lisinopril-hydrochlorothiazide (PRINZIDE,ZESTORETIC) 20-12.5 MG tablet Take 1 tablet by mouth daily.     No current facility-administered medications for this visit.     Review of Systems:  GENERAL:  Feels good.  No fevers or sweats.  Weight up 2 pounds. PERFORMANCE STATUS (ECOG):  0 HEENT:   No visual changes, runny nose, sore throat, mouth sores or tenderness. Lungs: No shortness of breath or cough.  No hemoptysis. Cardiac:  No chest pain, palpitations, orthopnea, or PND.  Did not take BP medication today. GI:  No nausea, vomiting, diarrhea, constipation, melena or hematochezia. GU:  No urgency, frequency, dysuria, or hematuria. Musculoskeletal:  No back pain.  Arthritic pain in knees.  No muscle tenderness. Extremities:  No pain or swelling. Skin:  No rashes or skin changes. Neuro:  No headache, numbness or weakness, balance or coordination issues. Endocrine:  No diabetes, thyroid issues, hot flashes or night sweats. Psych:  No mood changes, depression or anxiety. Pain:  No focal pain. Review of systems:  All other systems reviewed and found to be negative.  Physical Exam: Blood pressure (!) 209/77, pulse (!) 121, temperature (!) 96.4 F (35.8 C), temperature source Tympanic, weight 169 lb 12.8 oz (77 kg). GENERAL:  Well developed, well nourished, woman sitting comfortably in the exam room in no acute distress. MENTAL STATUS:  Alert and oriented to person, place and time. HEAD:  Short gray hair.  Normocephalic, atraumatic, face symmetric, no Cushingoid features. EYES:  Brown eyes.  Pupils equal round and reactive to light and accomodation.  No conjunctivitis or scleral icterus. ENT:  Oropharynx clear without lesion.  Tongue normal.  Dentures.  Mucous membranes moist.  RESPIRATORY:  Clear to auscultation without rales, wheezes or rhonchi. CARDIOVASCULAR:  Regular rate and rhythm without murmur, rub or gallop. BREAST:  Right breast without masses, skin changes or nipple discharge.  Fibrocystic changes 11 - 2 o'clock.  Left mastectomy scar without erythema, induration or nodularity. ABDOMEN:  Soft, non-tender, with active bowel sounds, and no hepatosplenomegaly.  No masses. SKIN:  No rashes, ulcers or lesions. EXTREMITIES: No edema, no skin discoloration or tenderness.  No  palpable cords. LYMPH NODES: No palpable cervical, supraclavicular, axillary or inguinal adenopathy  NEUROLOGICAL: Unremarkable. PSYCH:  Appropriate.   Appointment on 03/15/2017  Component Date Value Ref Range Status  . Sodium 03/15/2017 140  135 - 145 mmol/L Final  . Potassium 03/15/2017 3.5  3.5 - 5.1 mmol/L Final  . Chloride 03/15/2017 108  101 - 111 mmol/L Final  . CO2 03/15/2017 25  22 - 32 mmol/L Final  . Glucose, Bld 03/15/2017 97  65 - 99 mg/dL Final  . BUN 03/15/2017 20  6 - 20 mg/dL Final  . Creatinine, Ser 03/15/2017 0.60  0.44 - 1.00 mg/dL Final  . Calcium 03/15/2017 9.1  8.9 - 10.3 mg/dL Final  . Total Protein 03/15/2017 7.1  6.5 - 8.1 g/dL Final  . Albumin 03/15/2017 3.8  3.5 - 5.0 g/dL Final  . AST 03/15/2017 18  15 - 41 U/L Final  . ALT 03/15/2017 10* 14 - 54 U/L Final  . Alkaline Phosphatase 03/15/2017 86  38 - 126 U/L Final  . Total Bilirubin 03/15/2017 0.6  0.3 - 1.2 mg/dL Final  . GFR calc non Af Amer 03/15/2017 >60  >60 mL/min Final  . GFR calc Af Amer 03/15/2017 >60  >60 mL/min Final   Comment: (NOTE) The eGFR has been calculated using the CKD EPI equation. This calculation has not been validated in all clinical situations. eGFR's persistently <60 mL/min signify possible Chronic Kidney Disease.   . Anion gap 03/15/2017 7  5 - 15 Final  . WBC 03/15/2017 5.4  3.6 - 11.0 K/uL Final  . RBC 03/15/2017 4.22  3.80 - 5.20 MIL/uL Final  . Hemoglobin 03/15/2017 12.1  12.0 - 16.0 g/dL Final  . HCT 03/15/2017 35.5  35.0 - 47.0 % Final  . MCV 03/15/2017 84.3  80.0 - 100.0 fL Final  . MCH 03/15/2017 28.7  26.0 - 34.0 pg Final  . MCHC 03/15/2017 34.1  32.0 - 36.0 g/dL Final  . RDW 03/15/2017 14.3  11.5 - 14.5 % Final  . Platelets 03/15/2017 254  150 - 440 K/uL Final  . Neutrophils Relative % 03/15/2017 44  % Final  . Neutro Abs 03/15/2017 2.4  1.4 - 6.5 K/uL Final  . Lymphocytes Relative 03/15/2017 37  % Final  . Lymphs Abs 03/15/2017 2.0  1.0 - 3.6 K/uL Final  .  Monocytes Relative 03/15/2017 11  % Final  . Monocytes Absolute 03/15/2017 0.6  0.2 - 0.9 K/uL Final  . Eosinophils Relative 03/15/2017 7  % Final  . Eosinophils Absolute 03/15/2017 0.4  0 - 0.7 K/uL Final  . Basophils Relative 03/15/2017 1  % Final  . Basophils Absolute 03/15/2017 0.1  0 - 0.1 K/uL Final    Assessment:  CLOEY SFERRAZZA is a 71 y.o. female with DCIS of the left breast s/p mastectomy on 07/28/2016.  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  Size was 4.4 cm.  Margins were negative.  Two sentinel lymph nodes were negative.  Tumor was ER + (20%) and PR -.  Pathologic stage was pTisN0(sn).  She presented with nipple discharge.  Mammogram and ultrasound on 06/22/2016 revealed no abnormality.  MRI guided biopsy on 07/07/2016 revealed high grade ductal carcinoma in situ.    Breast MRI on 06/30/2016 revealed diffuse regional non mass enhancement of the left subareolar and inferior breast, involving approximately 6 cm in anterior to posterior dimension.  There was left nipple inversion with apparent skin thickening in the areolar/subareolar left breast.  There was an indeterminate lymph node within the axillary tail of the right breast.    Ultrasound of the right axillae on 07/10/2016 revealed a stable to slightly smaller intramammary lymph node (1 cm) compared to prior imaging.  Family history is notable breast cancer (maternal 1st cousin), prostate cancer (father, brother x 2), and colon cancer (mother).  She is not interested in genetic testing.  Symptomatically, patient doing well. She has knee pain. Exam reveals fibrocystic changes in RIGHT breast between the 11 and 2 o'clock positions. Mastectomy site on LEFT unremarkable.  Blood pressure is elevated.  Labs unremarkable.   Plan: 1.  Labs today:  CBC with diff, CMP, ferritin. 2.  Anticipate right mammogram on 06/23/2017 (ordered). 3.  Discuss consideration of genetic testing given family history.   Patient concerned about cost. No longer interested in pursuing. 4.  Discuss elevated blood pressure. Her baseline SBP runs in the 160s. She did not take her medications today. Discussed evaluation in ER or  f/u with PCP. 5.  Discuss need to restart taking oral iron with vitamin C.  6.  RTC in 6 months for MD assessment, labs (CBC with diff, CMP).   Honor Loh, NP  03/15/2017, 11:19 AM   I saw and evaluated the patient, participating in the key portions of the service and reviewing pertinent diagnostic studies and records.  I reviewed the nurse practitioner's note and agree with the findings and the plan.  The assessment and plan were discussed with the patient.  A few questions were asked by the patient and answered.   Nolon Stalls, MD 03/15/2017, 11:19 AM

## 2017-03-16 ENCOUNTER — Telehealth: Payer: Self-pay

## 2017-03-16 NOTE — Telephone Encounter (Signed)
Spoke with patient at this time. She is stating she paid to have an insurance form filled out and fax to [employer].   She will contact the above and have another form faxed over for [place] to complete as soon as possible.   Please call patient when formed is completed and faxed to [employer].

## 2017-05-23 IMAGING — US US BREAST*R* LIMITED INC AXILLA
1 series · 13 of 13 positions shown · non-contrast
Comparison: Correlation made with prior MRI from 06/30/2016.

CLINICAL DATA: Second-look ultrasound from MRI for a probable
intramammary lymph node in the right breast. The patient presented
with history of left nipple discharge and chronic left nipple
inversion. She had 2 sites of biopsy in the left breast both of
which showed high-grade ductal carcinoma in situ.

EXAM:
ULTRASOUND OF THE RIGHT BREAST

[Series 1: us breast*right* limited inc axilla · 0.07mm/px · 13 of 13 slices shown]
[im 1/13]
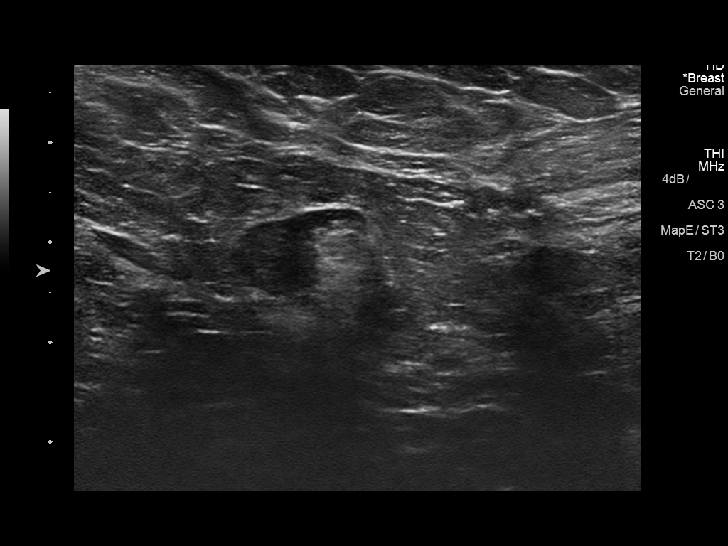
[im 2/13]
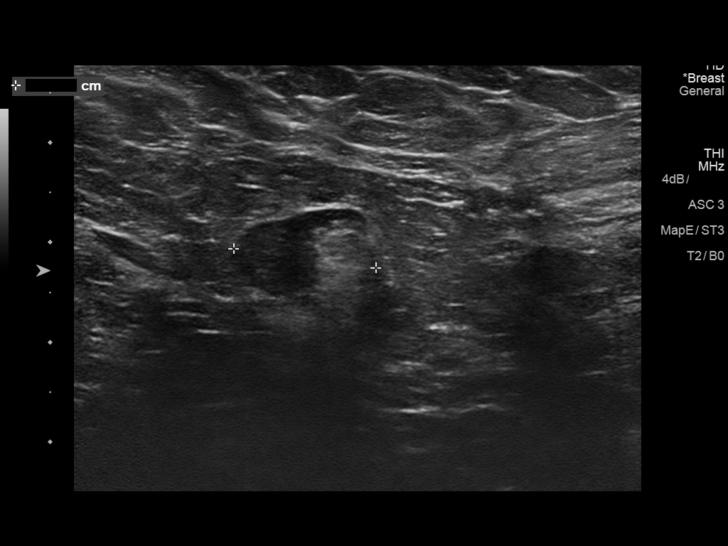
[im 3/13]
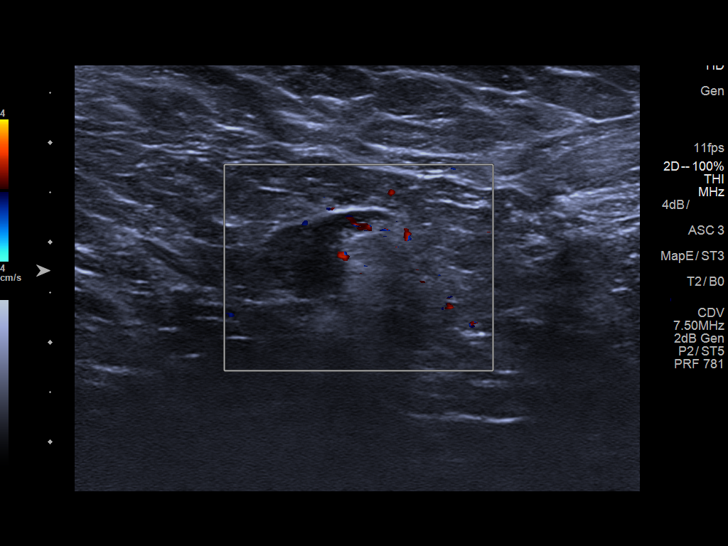
[im 4/13]
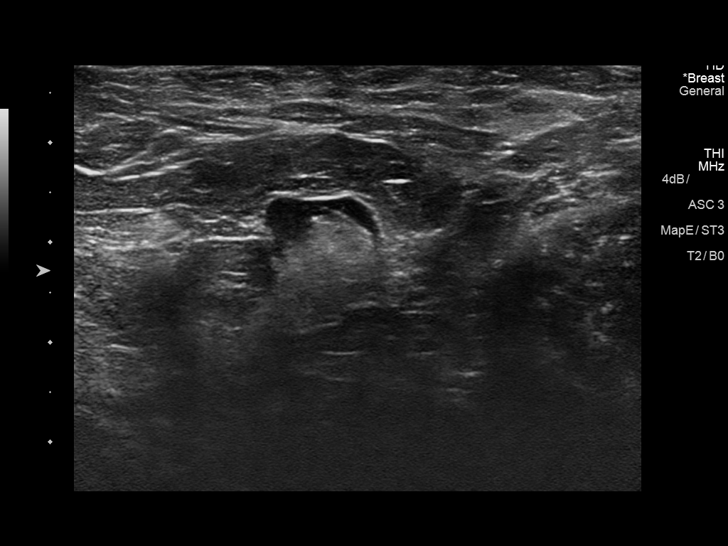
[im 5/13]
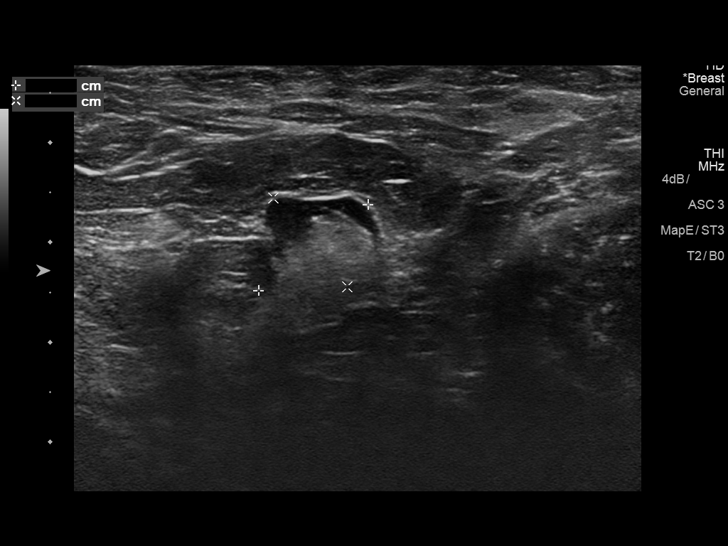
[im 6/13]
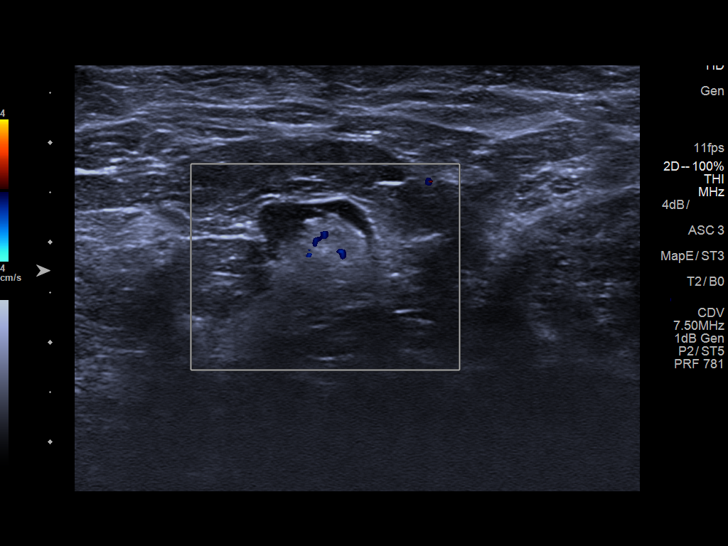
[im 7/13]
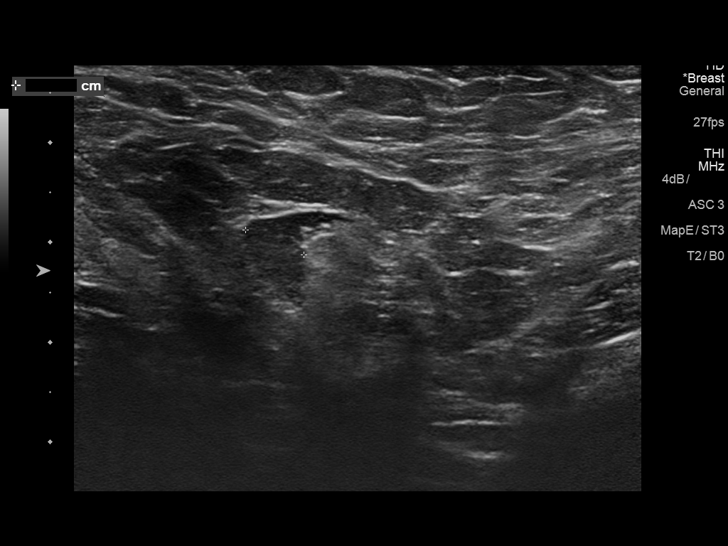
[im 8/13]
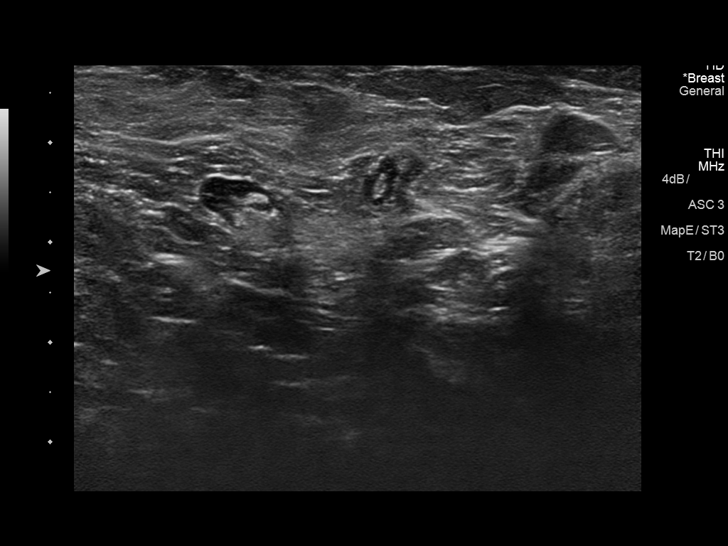
[im 9/13]
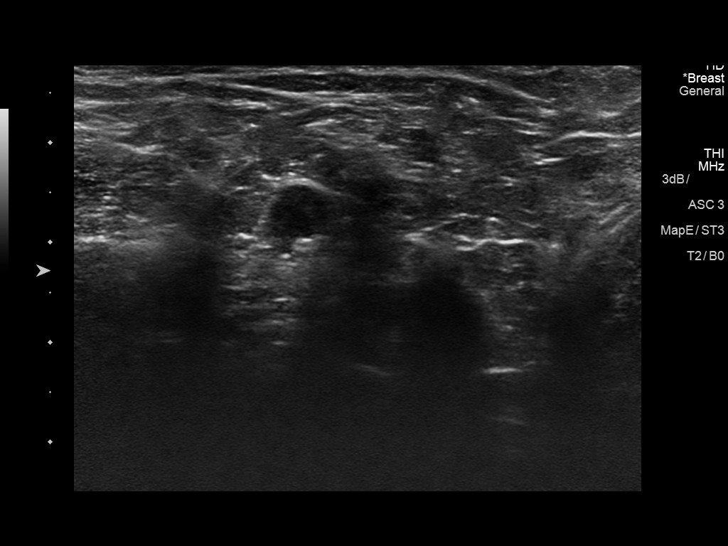
[im 10/13]
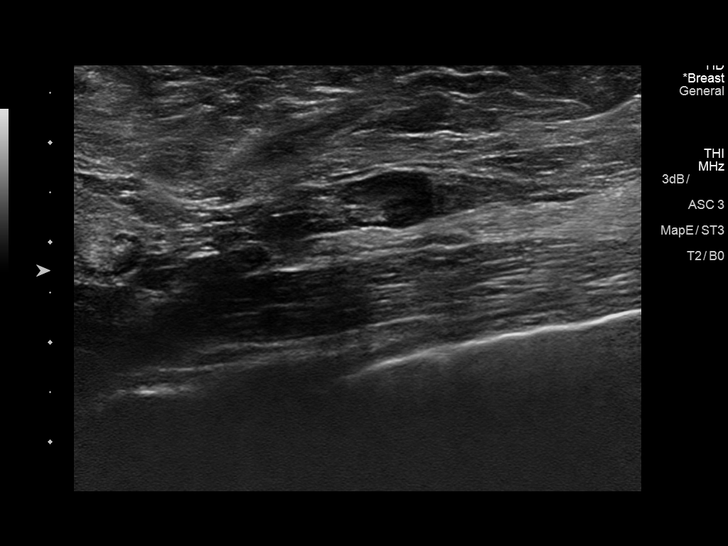
[im 11/13]
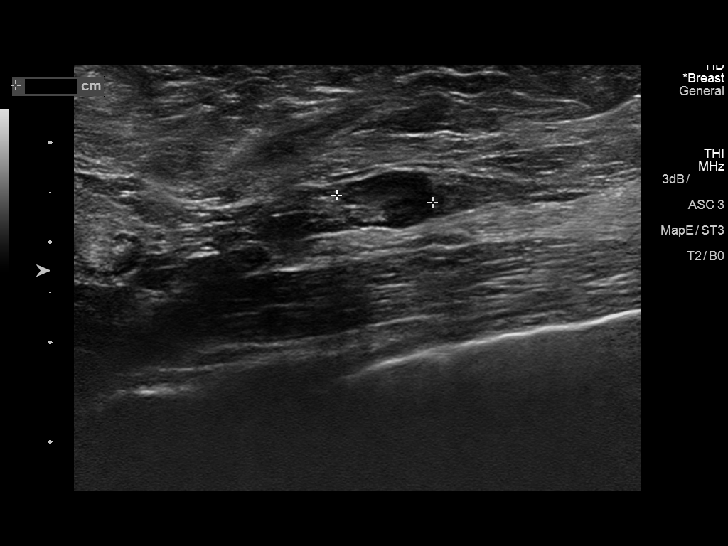
[im 12/13]
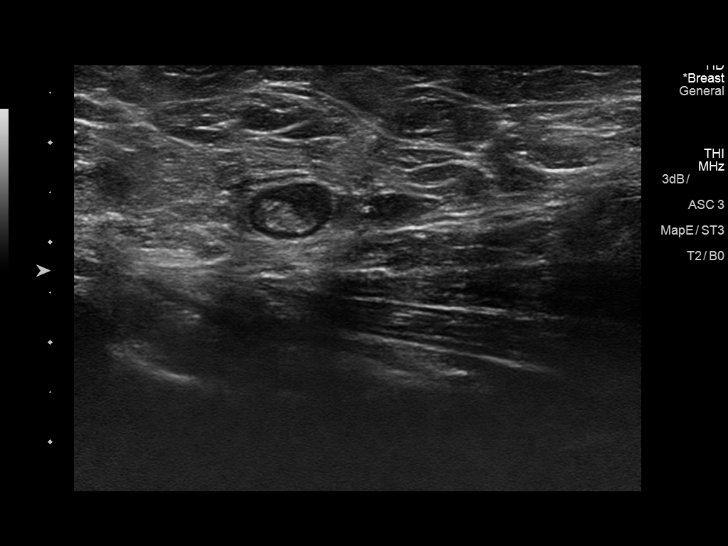
[im 13/13]
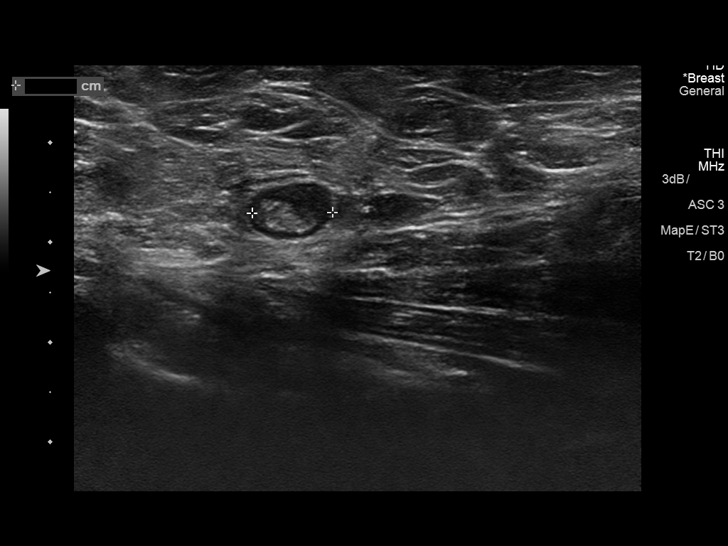

[13 of 13 positions shown; findings below may reference images not displayed]

FINDINGS: On physical exam, no suspicious palpable masses are identified in
the upper-outer quadrant of the right breast.

Targeted ultrasound is performed, showing multiple prominent lymph
nodes. The lymph node which likely corresponds with the intramammary
lymph node identified on the mammogram measures 1.0 cm. It has a
preserved fatty hilum.

This lymph node is seen on the most recent mammogram from
06/22/2016. As compared to the mammogram from 11/17/2012, this lymph
node has actually slightly decreased in size previously measuring
1.2 cm, and currently measuring 1.0 cm.

At the conclusion of the exam, the patient asked me to look at her
biopsy site in the left breast. She had Tegaderm overlying the
Ol[place]de and a blister had a formed along 1 edge of the
Tegaderm, which could be a reaction to the adhesive. I removed the
Tegaderm weeping the Olalde in place, and advised the patient
to let the [place] strips fall off naturally. The biopsy sites are dry
and intact.
IMPRESSION: 1. The intramammary lymph node in the right breast identified on MRI
corresponds with a 1.0 cm lymph node which is stable to slightly
smaller when comparing multiple prior mammograms.

RECOMMENDATION:
Continue treatment plan for the known DCIS in the left breast.

The patient states she has a surgical appointment on [REDACTED]. I
advised her to ask the surgeon to look at her biopsy site at that
appointment on [REDACTED] due to the possible adhesive reaction from the
Tegaderm. If she has any subsequent issues biopsy site, she is
encouraged to contact the orbital [REDACTED].

I have discussed the findings and recommendations with the patient.
Results were also provided in writing at the conclusion of the
visit. If applicable, a reminder letter will be sent to the patient
regarding the next appointment.

BI-RADS CATEGORY  2: Benign.

## 2017-05-27 ENCOUNTER — Telehealth: Payer: Self-pay

## 2017-05-27 NOTE — Telephone Encounter (Signed)
Raina called stating that patient was present stating that she was to have an [place] today. However I did not see an order placed, a note or an appointment stating that patient was due for any imaging today. I did speak with Adela Lank who was the last nurse from our office to contact patient and she advised that the patient does have an appointment coming up in January. I told Raina I was unsure who left a message for the patient to come have imaging completed today but that it was not our office. She verbalized understanding.

## 2017-06-23 ENCOUNTER — Ambulatory Visit
Admission: RE | Admit: 2017-06-23 | Discharge: 2017-06-23 | Disposition: A | Discharge status: Home / Self Care | Payer: Medicare Other | Source: Ambulatory Visit | Attending: Surgery | Admitting: Surgery

## 2017-06-23 DIAGNOSIS — D0512 Intraductal carcinoma in situ of left breast: Secondary | ICD-10-CM

## 2017-06-23 DIAGNOSIS — Z1231 Encounter for screening mammogram for malignant neoplasm of breast: Secondary | ICD-10-CM | POA: Insufficient documentation

## 2017-06-25 ENCOUNTER — Other Ambulatory Visit: Payer: Self-pay

## 2017-06-28 ENCOUNTER — Ambulatory Visit: Payer: Medicare Other | Admitting: Surgery

## 2017-06-29 ENCOUNTER — Ambulatory Visit (INDEPENDENT_AMBULATORY_CARE_PROVIDER_SITE_OTHER): Payer: Medicare Other | Admitting: Surgery

## 2017-06-29 ENCOUNTER — Encounter: Payer: Self-pay | Admitting: Surgery

## 2017-06-29 ENCOUNTER — Ambulatory Visit: Payer: Medicare Other | Admitting: Surgery

## 2017-06-29 VITALS — Temp 97.8°F | Wt 171.0 lb

## 2017-06-29 DIAGNOSIS — N63 Unspecified lump in unspecified breast: Secondary | ICD-10-CM

## 2017-06-29 NOTE — Progress Notes (Signed)
Outpatient Surgical Follow Up  06/29/2017  Lynn Wood is an 72 y.o. female.   Chief Complaint  Patient presents with  . Follow-up    Mammogram-Left Breast Mastectomy    HPI: Mrs Thuman is a 72 yo s/p lest mastectomy for extensive DCIS, she developed a scar contracture. Now is doing very well, no pain on breast.  She now feels a fullness underneath her left mastectomy site that has been present for the last few weeks and is slowly growing. No pain or other sxs. Recent mammo personally reviewed and is nml.  Past Medical History:  Diagnosis Date  . Benign neoplasm of colon   . Cancer ([place]) 06/2016   left breast  . Carpal tunnel syndrome   . DCIS (ductal carcinoma in situ) of breast   . Diabetes mellitus type 2, uncomplicated ([place])   . Hypertension   . Nontoxic uninodular goiter   . Pre-diabetes   . Pure hypercholesterolemia   . Vaginal vault prolapse   . Vitamin D deficiency     Past Surgical History:  Procedure Laterality Date  . COLONOSCOPY WITH PROPOFOL N/A 01/22/2015   Procedure: COLONOSCOPY WITH PROPOFOL;  Surgeon: Lollie Sails, MD;  Location: [facility] ENDOSCOPY;  Service: Endoscopy;  Laterality: N/A;  . GYNECOLOGIC CRYOSURGERY    . INCISION AND DRAINAGE ABSCESS Left 07/29/2016   Procedure: Reexploration of left breast mastectomy site;  Surgeon: Jules Husbands, MD;  Location: [facility] ORS;  Service: General;  Laterality: Left;  Marland Kitchen MASTECTOMY Left    March 2018  . MASTECTOMY W/ SENTINEL NODE BIOPSY Left 07/28/2016   Procedure: MASTECTOMY WITH SENTINEL LYMPH NODE BIOPSY;  Surgeon: Jules Husbands, MD;  Location: [facility] ORS;  Service: General;  Laterality: Left;  Marland Kitchen MULTIPLE TOOTH EXTRACTIONS  1997    Family History  Problem Relation Age of Onset  . Breast cancer Cousin   . Colon cancer Mother   . Prostate cancer Father   . Prostate cancer Brother   . Heart disease Daughter     Social History:  reports that she has been smoking cigarettes.  She has been smoking about 0.00  packs per day. she has never used smokeless tobacco. She reports that she does not drink alcohol or use drugs.  Allergies:  Allergies  Allergen Reactions  . Lipitor [Atorvastatin] Other (See Comments)    Arthalgia  . Lipofen [Fenofibrate] Other (See Comments)    Arthalgia  . Mevacor [Lovastatin] Other (See Comments)    Arthalgia  . Zetia [Ezetimibe] Other (See Comments)    Arthalgia  . Zocor [Simvastatin] Other (See Comments)    Arthalgia    Medications reviewed.    ROS Full ROS performed and is otherwise negative other than what is stated in HPI   Temp 97.8 F (36.6 C) (Oral)   Wt 77.6 kg (171 lb)   BMI 28.46 kg/m   Physical Exam  Constitutional: She is oriented to person, place, and time and well-developed, well-nourished, and in no distress. No distress.  Neck: No JVD present. No tracheal deviation present.  Pulmonary/Chest: Effort normal. No stridor. No respiratory distress. She exhibits no tenderness.  Abdominal: She exhibits no distension. There is no tenderness. There is no rebound.  Neurological: She is alert and oriented to person, place, and time. Gait normal.  Skin: Skin is warm and dry. She is not diaphoretic.  There is a left anterior chest wall Mobile soft tissue mass ( c/w lipoma) inferior to mastectomy scar. No evidence of recurrence disease  on her left side . Right breast no obvious abnormalities She does have a large left torso lipoma as well   Psychiatric: Mood, memory, affect and judgment normal.  Nursing note and vitals reviewed.   Assessment/Plan: New chest wall lipoma on a pt w previous breast CA. We will obtain U/S to make sure there is no recurrence . I will see her back in a few weeks. May need excision vs observation depending on results. 1. Lump in female breast - US BREAST LTD UNI LEFT INC AXILLA; Future    Greater than 50% of the 25 minutes  visit was spent in counseling/coordination of care   Caroleen Hamman, MD [place] Surgeon

## 2017-06-29 NOTE — Patient Instructions (Signed)
We will schedule your Ultrasound in a month and then a follow up appointment with Dr. Dahlia Byes. If you have any further changes on your left chest cavity, please give [place] a call so we could see you sooner.

## 2017-06-30 ENCOUNTER — Telehealth: Payer: Self-pay

## 2017-06-30 NOTE — Telephone Encounter (Signed)
Called patient to give her the U/S appointment and the follow up appointment with Dr. Dahlia Byes. I also told patient that I would mail her the appointment reminders and she agreed. Patient had no questions.

## 2017-07-06 ENCOUNTER — Ambulatory Visit
Admission: RE | Admit: 2017-07-06 | Discharge: 2017-07-06 | Disposition: A | Discharge status: Home / Self Care | Payer: Medicare Other | Source: Ambulatory Visit | Attending: Surgery | Admitting: Surgery

## 2017-07-06 DIAGNOSIS — Z9012 Acquired absence of left breast and nipple: Secondary | ICD-10-CM | POA: Insufficient documentation

## 2017-07-06 DIAGNOSIS — N63 Unspecified lump in unspecified breast: Secondary | ICD-10-CM

## 2017-07-06 DIAGNOSIS — N6320 Unspecified lump in the left breast, unspecified quadrant: Secondary | ICD-10-CM | POA: Insufficient documentation

## 2017-07-06 DIAGNOSIS — Z853 Personal history of malignant neoplasm of breast: Secondary | ICD-10-CM | POA: Diagnosis not present

## 2017-07-07 ENCOUNTER — Telehealth: Payer: Self-pay

## 2017-07-07 NOTE — Telephone Encounter (Signed)
Called patient to let her know that her ultrasound was normal. I also reminded her to come and see Dr. Dahlia Byes on 2/18/219. Patient agreed and had no further questions.

## 2017-08-03 ENCOUNTER — Ambulatory Visit (INDEPENDENT_AMBULATORY_CARE_PROVIDER_SITE_OTHER): Payer: Medicare Other | Admitting: Surgery

## 2017-08-03 ENCOUNTER — Encounter: Payer: Self-pay | Admitting: Surgery

## 2017-08-03 VITALS — Ht 65.0 in

## 2017-08-03 DIAGNOSIS — R202 Paresthesia of skin: Secondary | ICD-10-CM | POA: Diagnosis not present

## 2017-08-03 NOTE — Patient Instructions (Signed)
We have placed a referral today to Neurologist in regards to your Left Leg Numbness. We will call you with an appointment as soon as it has been made. This process typically takes 5-7 business days. If you do not hear from our office after that time period, please give our office a call so that we may check on the status for you.   If you have any questions or concerns please give our office a call.

## 2017-08-03 NOTE — Progress Notes (Signed)
Outpatient Surgical Follow Up  08/03/2017  Lynn Wood is an 72 y.o. female.   Chief Complaint  Patient presents with  . Follow-up    Follow Up: Possible Lipoma on left breast-Go over U/S results    HPI: s/p left mastectomy last year, doing well. U/S personally reviewed, no evidence of recurrence from left sided fullness. No pain or paresthesias on left arm . No  Lymphedema. She does report some weakness and hypoesthesia on left cal and left leg.  Past Medical History:  Diagnosis Date  . Benign neoplasm of colon   . Cancer ([place]) 06/2016   left breast  . Carpal tunnel syndrome   . DCIS (ductal carcinoma in situ) of breast   . Diabetes mellitus type 2, uncomplicated ([place])   . Hypertension   . Nontoxic uninodular goiter   . Pre-diabetes   . Pure hypercholesterolemia   . Vaginal vault prolapse   . Vitamin D deficiency     Past Surgical History:  Procedure Laterality Date  . COLONOSCOPY WITH PROPOFOL N/A 01/22/2015   Procedure: COLONOSCOPY WITH PROPOFOL;  Surgeon: Lollie Sails, MD;  Location[facility] ENDOSCOPY;  Service: Endoscopy;  Laterality: N/A;  . GYNECOLOGIC CRYOSURGERY    . INCISION AND DRAINAGE ABSCESS Left 07/29/2016   Procedure: Reexploration of left breast mastectomy site;  Surgeon: Jules Husbands, MD;  Location: [facility] ORS;  Service: General;  Laterality: Left;  Marland Kitchen MASTECTOMY Left    March 2018  . MASTECTOMY W/ SENTINEL NODE BIOPSY Left 07/28/2016   Procedure: MASTECTOMY WITH SENTINEL LYMPH NODE BIOPSY;  Surgeon: Jules Husbands, MD;  Location: [facility] ORS;  Service: General;  Laterality: Left;  Marland Kitchen MULTIPLE TOOTH EXTRACTIONS  1997    Family History  Problem Relation Age of Onset  . Breast cancer Cousin   . Colon cancer Mother   . Prostate cancer Father   . Prostate cancer Brother   . Heart disease Daughter     Social History:  reports that she has been smoking cigarettes.  She has been smoking about 0.00 packs per day. she has never used smokeless tobacco. She  reports that she does not drink alcohol or use drugs.  Allergies:  Allergies  Allergen Reactions  . Lipitor [Atorvastatin] Other (See Comments)    Arthalgia  . Lipofen [Fenofibrate] Other (See Comments)    Arthalgia  . Mevacor [Lovastatin] Other (See Comments)    Arthalgia  . Zetia [Ezetimibe] Other (See Comments)    Arthalgia  . Zocor [Simvastatin] Other (See Comments)    Arthalgia    Medications reviewed.    ROS Full ROS performed and is otherwise negative other than what is stated in HPI   Ht 5\' 5"  (1.651 m)   BMI 28.46 kg/m   Physical Exam  Constitutional: She is oriented to person, place, and time and well-developed, well-nourished, and in no distress. No distress.  Pulmonary/Chest: Effort normal. She exhibits no tenderness.  Left mastectomy scar, no evidence of recurrence or masses, no infection. Some fullness lateral and inferior to scar c/w lipoma vs fat deposit.  Musculoskeletal: Normal range of motion. She exhibits no edema.  Neurological: She is alert and oriented to person, place, and time. Gait normal. GCS score is 15.  Skin: Skin is warm and dry. She is not diaphoretic.  Psychiatric: Mood, memory, affect and judgment normal.  Nursing note and vitals reviewed.   Assessment/Plan: No evidence of recurrence from breast CA. Left intermittent paresthesia and weakness. I will refer to neurology for  evaluation.  Greater than 50% of the 25 minutes  visit was spent in counseling/coordination of care   Caroleen Hamman, MD [place] Surgeon

## 2017-08-10 ENCOUNTER — Encounter: Payer: Self-pay | Admitting: Neurology

## 2017-08-18 ENCOUNTER — Other Ambulatory Visit: Payer: Self-pay

## 2017-09-13 ENCOUNTER — Inpatient Hospital Stay: Payer: Medicare Other | Attending: Hematology and Oncology

## 2017-09-13 ENCOUNTER — Inpatient Hospital Stay: Payer: Medicare Other | Admitting: Hematology and Oncology

## 2017-09-13 ENCOUNTER — Other Ambulatory Visit: Payer: Self-pay | Admitting: Hematology and Oncology

## 2017-09-13 ENCOUNTER — Encounter: Payer: Self-pay | Admitting: Hematology and Oncology

## 2017-09-13 VITALS — BP 207/74 | HR 50 | Temp 97.5°F | Resp 18 | Wt 169.3 lb

## 2017-09-13 DIAGNOSIS — Z800 Family history of malignant neoplasm of digestive organs: Secondary | ICD-10-CM | POA: Diagnosis not present

## 2017-09-13 DIAGNOSIS — Z8042 Family history of malignant neoplasm of prostate: Secondary | ICD-10-CM

## 2017-09-13 DIAGNOSIS — I10 Essential (primary) hypertension: Secondary | ICD-10-CM

## 2017-09-13 DIAGNOSIS — Z803 Family history of malignant neoplasm of breast: Secondary | ICD-10-CM

## 2017-09-13 DIAGNOSIS — Z170 Estrogen receptor positive status [ER+]: Secondary | ICD-10-CM | POA: Insufficient documentation

## 2017-09-13 DIAGNOSIS — D649 Anemia, unspecified: Secondary | ICD-10-CM

## 2017-09-13 DIAGNOSIS — Z853 Personal history of malignant neoplasm of breast: Secondary | ICD-10-CM

## 2017-09-13 DIAGNOSIS — Z720 Tobacco use: Secondary | ICD-10-CM

## 2017-09-13 DIAGNOSIS — D0512 Intraductal carcinoma in situ of left breast: Secondary | ICD-10-CM

## 2017-09-13 LAB — COMPREHENSIVE METABOLIC PANEL
ALT: 11 U/L — ABNORMAL LOW (ref 14–54)
AST: 19 U/L (ref 15–41)
Albumin: 4 g/dL (ref 3.5–5.0)
Alkaline Phosphatase: 79 U/L (ref 38–126)
Anion gap: 9 (ref 5–15)
BUN: 13 mg/dL (ref 6–20)
CO2: 24 mmol/L (ref 22–32)
Calcium: 9.2 mg/dL (ref 8.9–10.3)
Chloride: 104 mmol/L (ref 101–111)
Creatinine, Ser: 0.7 mg/dL (ref 0.44–1.00)
GFR calc Af Amer: >60 mL/min (ref >60)
GFR calc non Af Amer: >60 mL/min (ref >60)
Glucose, Bld: 91 mg/dL (ref 65–99)
Potassium: 3.8 mmol/L (ref 3.5–5.1)
Sodium: 137 mmol/L (ref 135–145)
Total Bilirubin: 0.4 mg/dL (ref 0.3–1.2)
Total Protein: 7.3 g/dL (ref 6.5–8.1)

## 2017-09-13 LAB — CBC WITH DIFFERENTIAL/PLATELET
Basophils Absolute: 0.1 10*3/uL (ref 0–0.1)
Basophils Relative: 2 %
Eosinophils Absolute: 0.3 10*3/uL (ref 0–0.7)
Eosinophils Relative: 6 %
HCT: 38 % (ref 35.0–47.0)
Hemoglobin: 12.9 g/dL (ref 12.0–16.0)
Lymphocytes Relative: 31 %
Lymphs Abs: 1.7 10*3/uL (ref 1.0–3.6)
MCH: 28.5 pg (ref 26.0–34.0)
MCHC: 34.1 g/dL (ref 32.0–36.0)
MCV: 83.8 fL (ref 80.0–100.0)
Monocytes Absolute: 0.6 10*3/uL (ref 0.2–0.9)
Monocytes Relative: 11 %
Neutro Abs: 2.7 10*3/uL (ref 1.4–6.5)
Neutrophils Relative %: 50 %
Platelets: 273 10*3/uL (ref 150–440)
RBC: 4.54 MIL/uL (ref 3.80–5.20)
RDW: 13.8 % (ref 11.5–14.5)
WBC: 5.5 10*3/uL (ref 3.6–11.0)

## 2017-09-13 LAB — FERRITIN: Ferritin: 38 ng/mL (ref 11–307)

## 2017-09-13 NOTE — Progress Notes (Signed)
Patient's BP elevated today.  Rechecked after MD visit.  Continued to be elevated.  207/74 HR 50.  Advised patient to retake when she gets home.  If continues to be elevated advised her to call PCP.  Patient states she takes her BP medications at night.  She did take it last night.

## 2017-09-13 NOTE — Progress Notes (Signed)
[facility]-  Cancer Center  Clinic day:  09/13/2017  Chief Complaint: Lynn Wood is a 71 y.o. 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  HPI:  The patient was last seen in the medical oncology clinic on 06/15/2016.  At that time, she was doing well. She had knee pain. Exam revealed fibrocystic changes in RIGHT breast between the 11 and 2 o'clock positions. Mastectomy site on LEFT was unremarkable.  Blood pressure was elevated.  Labs were unremarkable.  Ferritin was 23.  Digital screening right mammogram on 06/23/2017 revealed no evidence of malignancy. Left axillary ultrasound on 07/06/2017 revealed no sonographic correlation to the area of palpable concern inferior to the left mastectomy scar, described by the patient.  During the interim, patient has been doing well. She has no complaints. Blood pressure elevated in the clinic today at 203/67. Patient monitors BP at home and notes that it has never been elevated to this degree. Patient denies headaches, visual changes, chest pain, and shortness of breath. She normally takes her antihypertensives at night.   Patient verbalizes no concerns related to her breast today. She has not had any B symptoms or interval infections. Patient has transient numbness and weakness in her LEFT lower extremity.   Patient is eating well. Her weight is down 2 pounds. Patient denies pain in the clinic today.    Past Medical History:  Diagnosis Date  . Benign neoplasm of colon   . Cancer (HCC) 06/2016   left breast  . Carpal tunnel syndrome   . DCIS (ductal carcinoma in situ) of breast   . Diabetes mellitus type 2, uncomplicated (HCC)   . Hypertension   . Nontoxic uninodular goiter   . Pre-diabetes   . Pure hypercholesterolemia   . Vaginal vault prolapse   . Vitamin D deficiency     Past Surgical History:  Procedure Laterality Date  . COLONOSCOPY WITH PROPOFOL N/A 01/22/2015   Procedure: COLONOSCOPY WITH  PROPOFOL;  Surgeon: Martin U Skulskie, MD;  Location: [facility] ENDOSCOPY;  Service: Endoscopy;  Laterality: N/A;  . GYNECOLOGIC CRYOSURGERY    . INCISION AND DRAINAGE ABSCESS Left 07/29/2016   Procedure: Reexploration of left breast mastectomy site;  Surgeon: Diego F Pabon, MD;  Location: [facility] ORS;  Service: General;  Laterality: Left;  . MASTECTOMY Left    March 2018  . MASTECTOMY W/ SENTINEL NODE BIOPSY Left 07/28/2016   Procedure: MASTECTOMY WITH SENTINEL LYMPH NODE BIOPSY;  Surgeon: Diego F Pabon, MD;  Location: [facility] ORS;  Service: General;  Laterality: Left;  . MULTIPLE TOOTH EXTRACTIONS  1997    Family History  Problem Relation Age of Onset  . Breast cancer Cousin   . Colon cancer Mother   . Prostate cancer Father   . Prostate cancer Brother   . Heart disease Daughter     Social History:  reports that she has been smoking cigarettes.  She has been smoking about 0.00 packs per day. She has never used smokeless tobacco. She reports that she does not drink alcohol or use drugs.  She lives in [place].  She has worked for the past 28 years at [employer].  She has been exposed to ethylene oxide.  The patient is accompanied by her husband, John, today.  Allergies:  Allergies  Allergen Reactions  . Lipitor [Atorvastatin] Other (See Comments)    Arthalgia  . Lipofen [Fenofibrate] Other (See Comments)    Arthalgia  . Mevacor [Lovastatin] Other (See Comments)      Arthalgia  . Zetia [Ezetimibe] Other (See Comments)    Arthalgia  . Zocor [Simvastatin] Other (See Comments)    Arthalgia    Current Medications: Current Outpatient Medications  Medication Sig Dispense Refill  . amLODipine (NORVASC) 5 MG tablet Take 1 tablet by mouth daily.    . aspirin EC 81 MG tablet Take 1 tablet by mouth 1 day or 1 dose.    . atenolol (TENORMIN) 50 MG tablet Take 50 mg by mouth daily.    . Cholecalciferol (VITAMIN D) 2000 units CAPS Take 1 capsule by mouth daily.    . clobetasol cream  (TEMOVATE) 0.05 % Apply 1 application topically 2 (two) times daily as needed.     . docusate sodium (COLACE) 100 MG capsule Take 1 capsule (100 mg total) by mouth 2 (two) times daily. 10 capsule 0  . lisinopril-hydrochlorothiazide (PRINZIDE,ZESTORETIC) 20-12.5 MG tablet Take 1 tablet by mouth daily.    . meloxicam (MOBIC) 15 MG tablet Take 1 tablet by mouth 1 day or 1 dose.    . fluticasone (FLONASE) 50 MCG/ACT nasal spray Place 1 spray into both nostrils daily as needed for allergies.     . Omega-3 Fatty Acids (FISH OIL PO) Take 1 capsule by mouth 1 day or 1 dose.     No current facility-administered medications for this visit.     Review of Systems:  GENERAL:  Feels good.  No fevers or sweats.  Weight down 2 pounds. PERFORMANCE STATUS (ECOG):  0 HEENT:  No visual changes, runny nose, sore throat, mouth sores or tenderness. Lungs: No shortness of breath or cough.  No hemoptysis. Cardiac:  No chest pain, palpitations, orthopnea, or PND.  Did not take BP medication today. GI:  No nausea, vomiting, diarrhea, constipation, melena or hematochezia. GU:  No urgency, frequency, dysuria, or hematuria. Musculoskeletal:  No back pain.  Arthritic pain in knees.  No muscle tenderness. Extremities:  No pain or swelling. Skin:  No rashes or skin changes. Neuro: Transient numbness and weakness in LEFT leg. No headache,  balance or coordination issues. Endocrine:  No diabetes, thyroid issues, hot flashes or night sweats. Psych:  No mood changes, depression or anxiety. Pain:  No focal pain. Review of systems:  All other systems reviewed and found to be negative.  Physical Exam: Blood pressure (!) 203/67, pulse (!) 56, temperature (!) 97.5 F (36.4 C), temperature source Tympanic, resp. rate 18, weight 169 lb 5 oz (76.8 kg). GENERAL:  Well developed, well nourished, woman sitting comfortably in the exam room in no acute distress. MENTAL STATUS:  Alert and oriented to person, place and time. HEAD:   Short gray hair.  Normocephalic, atraumatic, face symmetric, no Cushingoid features. EYES:  Brown eyes.  Pupils equal round and reactive to light and accomodation.  No conjunctivitis or scleral icterus. ENT:  Oropharynx clear without lesion.  Tongue normal.  Dentures.  Mucous membranes moist.  RESPIRATORY:  Clear to auscultation without rales, wheezes or rhonchi. CARDIOVASCULAR:  Regular rate and rhythm without murmur, rub or gallop. BREAST:  Right breast without masses, skin changes or nipple discharge.  Fibrocystic changes 11 - 2 o'clock.  Left mastectomy scar without erythema, induration or nodularity. ABDOMEN:  Soft, non-tender, with active bowel sounds, and no hepatosplenomegaly.  No masses. SKIN:  No rashes, ulcers or lesions. EXTREMITIES: No edema, no skin discoloration or tenderness.  No palpable cords. LYMPH NODES: No palpable cervical, supraclavicular, axillary or inguinal adenopathy  NEUROLOGICAL: Unremarkable. PSYCH:  Appropriate.   Appointment   on 09/13/2017  Component Date Value Ref Range Status  . Sodium 09/13/2017 137  135 - 145 mmol/L Final  . Potassium 09/13/2017 3.8  3.5 - 5.1 mmol/L Final  . Chloride 09/13/2017 104  101 - 111 mmol/L Final  . CO2 09/13/2017 24  22 - 32 mmol/L Final  . Glucose, Bld 09/13/2017 91  65 - 99 mg/dL Final  . BUN 09/13/2017 13  6 - 20 mg/dL Final  . Creatinine, Ser 09/13/2017 0.70  0.44 - 1.00 mg/dL Final  . Calcium 09/13/2017 9.2  8.9 - 10.3 mg/dL Final  . Total Protein 09/13/2017 7.3  6.5 - 8.1 g/dL Final  . Albumin 09/13/2017 4.0  3.5 - 5.0 g/dL Final  . AST 09/13/2017 19  15 - 41 U/L Final  . ALT 09/13/2017 11* 14 - 54 U/L Final  . Alkaline Phosphatase 09/13/2017 79  38 - 126 U/L Final  . Total Bilirubin 09/13/2017 0.4  0.3 - 1.2 mg/dL Final  . GFR calc non Af Amer 09/13/2017 >60  >60 mL/min Final  . GFR calc Af Amer 09/13/2017 >60  >60 mL/min Final   Comment: (NOTE) The eGFR has been calculated using the CKD EPI equation. This  calculation has not been validated in all clinical situations. eGFR's persistently <60 mL/min signify possible Chronic Kidney Disease.   . Anion gap 09/13/2017 9  5 - 15 Final   Performed at [facility], [address] [place], [place] [address]  . WBC 09/13/2017 5.5  3.6 - 11.0 K/uL Final  . RBC 09/13/2017 4.54  3.80 - 5.20 MIL/uL Final  . Hemoglobin 09/13/2017 12.9  12.0 - 16.0 g/dL Final  . HCT 09/13/2017 38.0  35.0 - 47.0 % Final  . MCV 09/13/2017 83.8  80.0 - 100.0 fL Final  . MCH 09/13/2017 28.5  26.0 - 34.0 pg Final  . MCHC 09/13/2017 34.1  32.0 - 36.0 g/dL Final  . RDW 09/13/2017 13.8  11.5 - 14.5 % Final  . Platelets 09/13/2017 273  150 - 440 K/uL Final  . Neutrophils Relative % 09/13/2017 50  % Final  . Neutro Abs 09/13/2017 2.7  1.4 - 6.5 K/uL Final  . Lymphocytes Relative 09/13/2017 31  % Final  . Lymphs Abs 09/13/2017 1.7  1.0 - 3.6 K/uL Final  . Monocytes Relative 09/13/2017 11  % Final  . Monocytes Absolute 09/13/2017 0.6  0.2 - 0.9 K/uL Final  . Eosinophils Relative 09/13/2017 6  % Final  . Eosinophils Absolute 09/13/2017 0.3  0 - 0.7 K/uL Final  . Basophils Relative 09/13/2017 2  % Final  . Basophils Absolute 09/13/2017 0.1  0 - 0.1 K/uL Final   Performed at [facility], [address] [place], [place] [address]    Assessment:  Lynn Wood is a 71 y.o. female with DCIS of the left breast s/p mastectomy on 07/28/2016.  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  Size was 4.4 cm.  Margins were negative.  Two sentinel lymph nodes were negative.  Tumor was ER + (20%) and PR -.  Pathologic stage was pTisN0(sn).  She declined hormonal therapy.  She declined genetic testing.  She presented with nipple discharge.  Mammogram and ultrasound on 06/22/2016 revealed no abnormality.  MRI guided biopsy on 07/07/2016 revealed high grade ductal carcinoma in situ.    Breast MRI on 06/30/2016 revealed diffuse regional non mass enhancement  of the left subareolar and inferior breast, involving approximately 6 cm in anterior   to posterior dimension.  There was left nipple inversion with apparent skin thickening in the areolar/subareolar left breast.  There was an indeterminate lymph node within the axillary tail of the right breast.    Digital screening right mammogram on 06/23/2017 revealed no evidence of malignancy. Left axillary ultrasound on 07/06/2017 revealed no sonographic correlation to the area of palpable concern inferior to the left mastectomy scar, described by the patient.  Ultrasound of the right axillae on 07/10/2016 revealed a stable to slightly smaller intramammary lymph node (1 cm) compared to prior imaging.  Family history is notable breast cancer (maternal 1st cousin), prostate cancer (father, brother x 2), and colon cancer (mother).  She is not interested in genetic testing.  Symptomatically, patient doing well.  She has no complaints. BP elevated in clinic today at 203/67. She takes her antihypertensives at night. Exam revealed stable fibrocystic changes in RIGHT breast. Mastectomy site on LEFT unremarkable.  Labs unremarkable.  Ferritin is 38.  Plan: 1.  Labs today:  CBC with diff, CMP, ferritin. 2.  Review interval mammogram and ultrasound- no evidence of disease. 3.  Discuss consideration of genetic testing given family history.  Patient concerned about cost. No longer interested in pursuing. 4.  Discuss hormonal therapy. Patient elected not to pursue therapy.  5.  Discuss elevated blood pressure. Her baseline SBP runs in the 160s. She did not take her medications today. Discussed evaluation in ER (declined) or f/u with PCP. Patient recently saw PCP and had another medication added. Subsequent checks at PCP office has been "ok".  6.  Discuss iron stores. Patient notes that she is no longer taking her oral iron.  7.  RTC in 6 months for MD assessment and labs (CBC with diff, CMP).   Bryan Gray, NP  09/13/2017,  12:30 PM   I saw and evaluated the patient, participating in the key portions of the service and reviewing pertinent diagnostic studies and records.  I reviewed the nurse practitioner's note and agree with the findings and the plan.  The assessment and plan were discussed with the patient.  A few questions were asked by the patient and answered.   Melissa Corcoran, MD 09/13/2017, 12:30 PM  

## 2017-09-13 NOTE — Progress Notes (Signed)
Pt in for 6 month follow up.  Denies any concerns or difficulties.   

## 2017-11-22 ENCOUNTER — Encounter: Payer: Self-pay | Admitting: Neurology

## 2017-11-22 ENCOUNTER — Ambulatory Visit (INDEPENDENT_AMBULATORY_CARE_PROVIDER_SITE_OTHER): Payer: Medicare Other | Admitting: Neurology

## 2017-11-22 VITALS — BP 130/70 | HR 72 | Ht 65.0 in | Wt 168.5 lb

## 2017-11-22 DIAGNOSIS — R200 Anesthesia of skin: Secondary | ICD-10-CM

## 2017-11-22 DIAGNOSIS — H5340 Unspecified visual field defects: Secondary | ICD-10-CM

## 2017-11-22 DIAGNOSIS — M47816 Spondylosis without myelopathy or radiculopathy, lumbar region: Secondary | ICD-10-CM | POA: Diagnosis not present

## 2017-11-22 NOTE — Progress Notes (Signed)
[facility] Clinic Note - Initial Visit   Date: 11/22/17  RHANDI DESPAIN MRN: [id number] DOB: 16-Jun-1945   Dear Dr. Dahlia Byes:  Thank you for your kind referral of Lynn Wood for consultation of left leg numbness. Although her history is well known to you, please allow Lynn Wood to reiterate it for the purpose of our medical record. The patient was accompanied to the clinic by husband who also provides collateral information.     History of Present Illness: Lynn Wood is a 72 y.o. right-handed African American female with left breast cancer s/p mastectomy, pre-diabetes, hypertension, and hyperlipidemia presenting for evaluation of left leg numbness.    Starting ~May 2018, she began noticing spells of numbness in the left lower leg, foot, and toes. Symptoms are intermittent and aggravated by activity, improved by rest.  She denies any low back pain or painful paresthesia.  She has some weakness in the left leg, as if it can buckle.  She walks unassisted and does not use a cane.  She denies any numbness of the right leg.  These symptoms have been getting worse over the past several months.    She also complains of left eye visual field loss which started in early May 2019 and is followed by ophthalmology.  She is not sure what caused it, but says that it may be due to blood pressure.    Out-side paper records, electronic medical record, and images have been reviewed where available and summarized as:  Labs 10/15/2017:  HbA1c 6.0  Past Medical History:  Diagnosis Date  . Benign neoplasm of colon   . Cancer ([place]) 06/2016   left breast  . Carpal tunnel syndrome   . DCIS (ductal carcinoma in situ) of breast   . Diabetes mellitus type 2, uncomplicated ([place])   . Hypertension   . Nontoxic uninodular goiter   . Pre-diabetes   . Pure hypercholesterolemia   . Vaginal vault prolapse   . Vitamin D deficiency     Past Surgical History:  Procedure Laterality Date    . COLONOSCOPY WITH PROPOFOL N/A 01/22/2015   Procedure: COLONOSCOPY WITH PROPOFOL;  Surgeon: Lollie Sails, MD;  Location: [facility] ENDOSCOPY;  Service: Endoscopy;  Laterality: N/A;  . GYNECOLOGIC CRYOSURGERY    . INCISION AND DRAINAGE ABSCESS Left 07/29/2016   Procedure: Reexploration of left breast mastectomy site;  Surgeon: Jules Husbands, MD;  Location: [facility] ORS;  Service: General;  Laterality: Left;  Marland Kitchen MASTECTOMY Left    March 2018  . MASTECTOMY W/ SENTINEL NODE BIOPSY Left 07/28/2016   Procedure: MASTECTOMY WITH SENTINEL LYMPH NODE BIOPSY;  Surgeon: Jules Husbands, MD;  Location: [facility] ORS;  Service: General;  Laterality: Left;  Marland Kitchen MULTIPLE TOOTH EXTRACTIONS  1997     Medications:  Outpatient Encounter Medications as of 11/22/2017  Medication Sig Note  . amLODipine (NORVASC) 5 MG tablet Take 1 tablet by mouth daily.   Marland Kitchen aspirin EC 81 MG tablet Take 1 tablet by mouth 1 day or 1 dose.   . Cholecalciferol (VITAMIN D) 2000 units CAPS Take 1 capsule by mouth daily.   . clobetasol cream (TEMOVATE) 6.71 % Apply 1 application topically 2 (two) times daily as needed.    . docusate sodium (COLACE) 100 MG capsule Take 1 capsule (100 mg total) by mouth 2 (two) times daily.   . fluticasone (FLONASE) 50 MCG/ACT nasal spray Place 1 spray into both nostrils daily as needed for allergies.  07/14/2016: Takes PRN  .  meloxicam (MOBIC) 15 MG tablet Take 1 tablet by mouth 1 day or 1 dose.   . Omega-3 Fatty Acids (FISH OIL PO) Take 1 capsule by mouth 1 day or 1 dose.   Marland Kitchen lisinopril-hydrochlorothiazide (PRINZIDE,ZESTORETIC) 20-12.5 MG tablet Take 1 tablet by mouth daily.   . [DISCONTINUED] atenolol (TENORMIN) 50 MG tablet Take 50 mg by mouth daily.    No facility-administered encounter medications on file as of 11/22/2017.      Allergies:  Allergies  Allergen Reactions  . Lipitor [Atorvastatin] Other (See Comments)    Arthalgia  . Lipofen [Fenofibrate] Other (See Comments)    Arthalgia  . Mevacor  [Lovastatin] Other (See Comments)    Arthalgia  . Zetia [Ezetimibe] Other (See Comments)    Arthalgia  . Zocor [Simvastatin] Other (See Comments)    Arthalgia    Family History: Family History  Problem Relation Age of Onset  . Breast cancer Cousin   . Colon cancer Mother   . Prostate cancer Father   . Prostate cancer Brother   . Heart disease Daughter     Social History: Social History   Tobacco Use  . Smoking status: Light Tobacco Smoker    Packs/day: 0.00    Types: Cigarettes  . Smokeless tobacco: Never Used  . Tobacco comment: 1 cigarette every 1-2 weeks  Substance Use Topics  . Alcohol use: No  . Drug use: No   Social History   Social History Narrative   Lives with husband in a one story home.  Has 2 children.  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  Education: college.     Review of Systems:  CONSTITUTIONAL: No fevers, chills, night sweats, or weight loss.   EYES: + visual changes or eye pain ENT: No hearing changes.  No history of nose bleeds.   RESPIRATORY: No cough, wheezing and shortness of breath.   CARDIOVASCULAR: Negative for chest pain, and palpitations.   GI: Negative for abdominal discomfort, blood in stools or black stools.  No recent change in bowel habits.   GU:  No history of incontinence.   MUSCLOSKELETAL: No history of joint pain or swelling.  No myalgias.   SKIN: Negative for lesions, rash, and itching.   HEMATOLOGY/ONCOLOGY: Negative for prolonged bleeding, bruising easily, and swollen nodes.  +history of cancer.   ENDOCRINE: Negative for cold or heat intolerance, polydipsia or goiter.   PSYCH:  No depression or anxiety symptoms.   NEURO: As Above.   Vital Signs:  BP 130/70   Pulse 72   Ht 5\' 5"  (1.651 m)   Wt 168 lb 8 oz (76.4 kg)   SpO2 98%   BMI 28.04 kg/m    General Medical Exam:   General:  Well appearing, comfortable.   Eyes/ENT: see cranial nerve examination.   Neck: No  masses appreciated.  Full range of motion without tenderness.  No carotid bruits. Respiratory:  Clear to auscultation, good air entry bilaterally.   Cardiac:  Regular rate and rhythm, no murmur.   Extremities:  No deformities, edema, or skin discoloration.  Skin:  No rashes or lesions.  Neurological Exam: MENTAL STATUS including orientation to time, place, person, recent and remote memory, attention span and concentration, language, and fund of knowledge is normal.  Speech is not dysarthric.  CRANIAL NERVES: II:  Left monocular nasal and central field defect, right visual field intact.  Unremarkable fundi.   III-IV-VI: Pupils equal round and reactive  to light.  Normal conjugate, extra-ocular eye movements in all directions of gaze.  No nystagmus.  No ptosis.   V:  Normal facial sensation.   VII:  Normal facial symmetry and movements.   VIII:  Normal hearing and vestibular function.   IX-X:  Normal palatal movement.   XI:  Normal shoulder shrug and head rotation.   XII:  Normal tongue strength and range of motion, no deviation or fasciculation.  MOTOR:  No atrophy, fasciculations or abnormal movements.  No pronator drift.  Tone is normal.    Right Upper Extremity:    Left Upper Extremity:    Deltoid  5/5   Deltoid  5/5   Biceps  5/5   Biceps  5/5   Triceps  5/5   Triceps  5/5   Wrist extensors  5/5   Wrist extensors  5/5   Wrist flexors  5/5   Wrist flexors  5/5   Finger extensors  5/5   Finger extensors  5/5   Finger flexors  5/5   Finger flexors  5/5   Dorsal interossei  5/5   Dorsal interossei  5/5   Abductor pollicis  5/5   Abductor pollicis  5/5   Tone (Ashworth scale)  0  Tone (Ashworth scale)  0   Right Lower Extremity:    Left Lower Extremity:    Hip flexors  5/5   Hip flexors  5/5   Hip extensors  5/5   Hip extensors  5/5   Knee flexors  5/5   Knee flexors  5/5   Knee extensors  5/5   Knee extensors  5/5   Dorsiflexors  5/5   Dorsiflexors  5/5   Plantarflexors  5/5    Plantarflexors  5/5   Toe extensors  5/5   Toe extensors  5/5   Toe flexors  5/5   Toe flexors  5/5   Tone (Ashworth scale)  0  Tone (Ashworth scale)  0   MSRs:  Right                                                                 Left brachioradialis 2+  brachioradialis 2+  biceps 2+  biceps 2+  triceps 2+  triceps 2+  patellar 2+  patellar 2+  ankle jerk 2+  ankle jerk 2+  Hoffman no  Hoffman no  plantar response down  plantar response down   SENSORY:  Normal and symmetric perception of light touch, pinprick, vibration, and proprioception.    COORDINATION/GAIT: Normal finger-to- nose-finger.  Intact rapid alternating movements bilaterally. Gait narrow based and stable. Tandem and stressed gait intact.    IMPRESSION: Left lower leg numbness, possibly due to lumbar canal stenosis as symptoms are triggered by activity and relieved by rest.  Her exam is largely unremarkably without change in reflexes, strength, or sensation.  We discussed diagnostic testing to help loxalize symptoms including MRI lumbar spine and NCS/EMG of the leg.  Alternatively, she can start physical therapy for low back stretching/stregthening and see if this helps.  She would like to start PT and consider additional testing, if no improvement.  Left monocular visual field loss is followed by opthalmology.  Unlikely to be central nervous system pathology given monocular symptoms.  I  do not see that there is evidence of central artery occlusion from her ER notes, so Lynn Wood carotids is not indicated.  I am happy to coordinate additional tested, if her ophthalmology evaluation is nondiagnostic.  Return to clinic in 3 months   Thank you for allowing me to participate in patient's care.  If I can answer any additional questions, I would be pleased to do so.    Sincerely,    Donika K. Posey Pronto, DO

## 2017-11-22 NOTE — Patient Instructions (Addendum)
Start physical therapy for low back strengthening and stretching  If your symptoms get worse, please call my office and we can schedule MRI of your low back  Return to clinic in 3 months

## 2017-12-10 DIAGNOSIS — E7849 Other hyperlipidemia: Secondary | ICD-10-CM | POA: Insufficient documentation

## 2018-02-23 ENCOUNTER — Ambulatory Visit: Payer: Medicare Other | Admitting: Neurology

## 2018-03-15 ENCOUNTER — Inpatient Hospital Stay: Payer: Medicare Other | Attending: Hematology and Oncology

## 2018-03-15 ENCOUNTER — Inpatient Hospital Stay: Payer: Medicare Other | Admitting: Hematology and Oncology

## 2018-03-15 NOTE — Progress Notes (Deleted)
[facility] day:  03/15/2018  Chief Complaint: Lynn Wood is a 72 y.o. 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  HPI:  The patient was last seen in the medical oncology clinic on 09/13/2017.  At that time, she was doing well.  She had no complaints. BP was elevated in clinic today at 203/67. She took her antihypertensives at night. Exam revealed stable fibrocystic changes in RIGHT breast. Mastectomy site on LEFT was unremarkable.  Labs were unremarkable.  Ferritin was 38.    she was doing well. She had knee pain. Exam revealed fibrocystic changes in RIGHT breast between the 11 and 2 o'clock positions. Mastectomy site on LEFT was unremarkable.  Blood pressure was elevated.  Labs were unremarkable.  Ferritin was 23.  Digital screening right mammogram on 06/23/2017 revealed no evidence of malignancy. Left axillary ultrasound on 07/06/2017 revealed no sonographic correlation to the area of palpable concern inferior to the left mastectomy scar, described by the patient.  During the interim, patient has been doing well. She has no complaints. Blood pressure elevated in the clinic today at 203/67. Patient monitors BP at home and notes that it has never been elevated to this degree. Patient denies headaches, visual changes, chest pain, and shortness of breath. She normally takes her antihypertensives at night.   Patient verbalizes no concerns related to her breast today. She has not had any B symptoms or interval infections. Patient has transient numbness and weakness in her LEFT lower extremity.   Patient is eating well. Her weight is down 2 pounds. Patient denies pain in the clinic today.    Past Medical History:  Diagnosis Date  . Benign neoplasm of colon   . Cancer ([place]) 06/2016   left breast  . Carpal tunnel syndrome   . DCIS (ductal carcinoma in situ) of breast   . Diabetes mellitus type 2, uncomplicated ([place])   .  Hypertension   . Nontoxic uninodular goiter   . Pre-diabetes   . Pure hypercholesterolemia   . Vaginal vault prolapse   . Vitamin D deficiency     Past Surgical History:  Procedure Laterality Date  . COLONOSCOPY WITH PROPOFOL N/A 01/22/2015   Procedure: COLONOSCOPY WITH PROPOFOL;  Surgeon: Lollie Sails, MD;  Location: [facility] ENDOSCOPY;  Service: Endoscopy;  Laterality: N/A;  . GYNECOLOGIC CRYOSURGERY    . INCISION AND DRAINAGE ABSCESS Left 07/29/2016   Procedure: Reexploration of left breast mastectomy site;  Surgeon: Jules Husbands, MD;  Location: [facility] ORS;  Service: General;  Laterality: Left;  Marland Kitchen MASTECTOMY Left    March 2018  . MASTECTOMY W/ SENTINEL NODE BIOPSY Left 07/28/2016   Procedure: MASTECTOMY WITH SENTINEL LYMPH NODE BIOPSY;  Surgeon: Jules Husbands, MD;  Location: [facility] ORS;  Service: General;  Laterality: Left;  Marland Kitchen MULTIPLE TOOTH EXTRACTIONS  1997    Family History  Problem Relation Age of Onset  . Breast cancer Cousin   . Colon cancer Mother   . Prostate cancer Father   . Prostate cancer Brother   . Heart disease Daughter     Social History:  reports that she has been smoking cigarettes. She has been smoking about 0.00 packs per day. She has never used smokeless tobacco. She reports that she does not drink alcohol or use drugs.  She lives in [place].  She has worked for the past 28 years at [employer].  She has been exposed to ethylene  oxide.  The patient is accompanied by her husband, Lynn Wood, today.  Allergies:  Allergies  Allergen Reactions  . Lipitor [Atorvastatin] Other (See Comments)    Arthalgia  . Lipofen [Fenofibrate] Other (See Comments)    Arthalgia  . Mevacor [Lovastatin] Other (See Comments)    Arthalgia  . Zetia [Ezetimibe] Other (See Comments)    Arthalgia  . Zocor [Simvastatin] Other (See Comments)    Arthalgia    Current Medications: Current Outpatient Medications  Medication Sig Dispense Refill  . amLODipine (NORVASC) 5 MG tablet  Take 1 tablet by mouth daily.    Marland Kitchen aspirin EC 81 MG tablet Take 1 tablet by mouth 1 day or 1 dose.    . Cholecalciferol (VITAMIN D) 2000 units CAPS Take 1 capsule by mouth daily.    . clobetasol cream (TEMOVATE) 4.82 % Apply 1 application topically 2 (two) times daily as needed.     . docusate sodium (COLACE) 100 MG capsule Take 1 capsule (100 mg total) by mouth 2 (two) times daily. 10 capsule 0  . fluticasone (FLONASE) 50 MCG/ACT nasal spray Place 1 spray into both nostrils daily as needed for allergies.     Marland Kitchen lisinopril-hydrochlorothiazide (PRINZIDE,ZESTORETIC) 20-12.5 MG tablet Take 1 tablet by mouth daily.    . meloxicam (MOBIC) 15 MG tablet Take 1 tablet by mouth 1 day or 1 dose.    . Omega-3 Fatty Acids (FISH OIL PO) Take 1 capsule by mouth 1 day or 1 dose.     No current facility-administered medications for this visit.     Review of Systems:  GENERAL:  Feels good.  No fevers or sweats.  Weight down 2 pounds. PERFORMANCE STATUS (ECOG):  0 HEENT:  No visual changes, runny nose, sore throat, mouth sores or tenderness. Lungs: No shortness of breath or cough.  No hemoptysis. Cardiac:  No chest pain, palpitations, orthopnea, or PND.  Did not take BP medication today. GI:  No nausea, vomiting, diarrhea, constipation, melena or hematochezia. GU:  No urgency, frequency, dysuria, or hematuria. Musculoskeletal:  No back pain.  Arthritic pain in knees.  No muscle tenderness. Extremities:  No pain or swelling. Skin:  No rashes or skin changes. Neuro: Transient numbness and weakness in LEFT leg. No headache,  balance or coordination issues. Endocrine:  No diabetes, thyroid issues, hot flashes or night sweats. Psych:  No mood changes, depression or anxiety. Pain:  No focal pain. Review of systems:  All other systems reviewed and found to be negative.  Physical Exam: There were no vitals taken for this visit. GENERAL:  Well developed, well nourished, woman sitting comfortably in the exam room  in no acute distress. MENTAL STATUS:  Alert and oriented to person, place and time. HEAD:  Short gray hair.  Normocephalic, atraumatic, face symmetric, no Cushingoid features. EYES:  Brown eyes.  Pupils equal round and reactive to light and accomodation.  No conjunctivitis or scleral icterus. ENT:  Oropharynx clear without lesion.  Tongue normal.  Dentures.  Mucous membranes moist.  RESPIRATORY:  Clear to auscultation without rales, wheezes or rhonchi. CARDIOVASCULAR:  Regular rate and rhythm without murmur, rub or gallop. BREAST:  Right breast without masses, skin changes or nipple discharge.  Fibrocystic changes 11 - 2 o'clock.  Left mastectomy scar without erythema, induration or nodularity. ABDOMEN:  Soft, non-tender, with active bowel sounds, and no hepatosplenomegaly.  No masses. SKIN:  No rashes, ulcers or lesions. EXTREMITIES: No edema, no skin discoloration or tenderness.  No palpable cords. LYMPH NODES:  No palpable cervical, supraclavicular, axillary or inguinal adenopathy  NEUROLOGICAL: Unremarkable. PSYCH:  Appropriate.   No visits with results within 3 Day(s) from this visit.  Latest known visit with results is:  Appointment on 09/13/2017  Component Date Value Ref Range Status  . Sodium 09/13/2017 137  135 - 145 mmol/L Final  . Potassium 09/13/2017 3.8  3.5 - 5.1 mmol/L Final  . Chloride 09/13/2017 104  101 - 111 mmol/L Final  . CO2 09/13/2017 24  22 - 32 mmol/L Final  . Glucose, Bld 09/13/2017 91  65 - 99 mg/dL Final  . BUN 09/13/2017 13  6 - 20 mg/dL Final  . Creatinine, Ser 09/13/2017 0.70  0.44 - 1.00 mg/dL Final  . Calcium 09/13/2017 9.2  8.9 - 10.3 mg/dL Final  . Total Protein 09/13/2017 7.3  6.5 - 8.1 g/dL Final  . Albumin 09/13/2017 4.0  3.5 - 5.0 g/dL Final  . AST 09/13/2017 19  15 - 41 U/L Final  . ALT 09/13/2017 11* 14 - 54 U/L Final  . Alkaline Phosphatase 09/13/2017 79  38 - 126 U/L Final  . Total Bilirubin 09/13/2017 0.4  0.3 - 1.2 mg/dL Final  . GFR calc  non Af Amer 09/13/2017 >60  >60 mL/min Final  . GFR calc Af Amer 09/13/2017 >60  >60 mL/min Final   Comment: (NOTE) The eGFR has been calculated using the CKD EPI equation. This calculation has not been validated in all clinical situations. eGFR's persistently <60 mL/min signify possible Chronic Kidney Disease.   Georgiann Hahn gap 09/13/2017 9  5 - 15 Final   Performed at [facility], [address] [place], [place] [address]  . WBC 09/13/2017 5.5  3.6 - 11.0 K/uL Final  . RBC 09/13/2017 4.54  3.80 - 5.20 MIL/uL Final  . Hemoglobin 09/13/2017 12.9  12.0 - 16.0 g/dL Final  . HCT 09/13/2017 38.0  35.0 - 47.0 % Final  . MCV 09/13/2017 83.8  80.0 - 100.0 fL Final  . MCH 09/13/2017 28.5  26.0 - 34.0 pg Final  . MCHC 09/13/2017 34.1  32.0 - 36.0 g/dL Final  . RDW 09/13/2017 13.8  11.5 - 14.5 % Final  . Platelets 09/13/2017 273  150 - 440 K/uL Final  . Neutrophils Relative % 09/13/2017 50  % Final  . Neutro Abs 09/13/2017 2.7  1.4 - 6.5 K/uL Final  . Lymphocytes Relative 09/13/2017 31  % Final  . Lymphs Abs 09/13/2017 1.7  1.0 - 3.6 K/uL Final  . Monocytes Relative 09/13/2017 11  % Final  . Monocytes Absolute 09/13/2017 0.6  0.2 - 0.9 K/uL Final  . Eosinophils Relative 09/13/2017 6  % Final  . Eosinophils Absolute 09/13/2017 0.3  0 - 0.7 K/uL Final  . Basophils Relative 09/13/2017 2  % Final  . Basophils Absolute 09/13/2017 0.1  0 - 0.1 K/uL Final   Performed at [facility], [address] [place], [place] [address]  . Ferritin 09/13/2017 38  11 - 307 ng/mL Final   Performed at [facility], [address] [place], [place] [address]    Assessment:  AISLINN FELIZ is a 72 y.o. female with DCIS of the left breast s/p mastectomy on 07/28/2016.  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  Size was 4.4 cm.  Margins were negative.  Two sentinel lymph nodes were negative.  Tumor was ER + (20%) and PR -.  Pathologic stage was pTisN0(sn).  She  declined hormonal  therapy.  She declined genetic testing.  She presented with nipple discharge.  Mammogram and ultrasound on 06/22/2016 revealed no abnormality.  MRI guided biopsy on 07/07/2016 revealed high grade ductal carcinoma in situ.    Breast MRI on 06/30/2016 revealed diffuse regional non mass enhancement of the left subareolar and inferior breast, involving approximately 6 cm in anterior to posterior dimension.  There was left nipple inversion with apparent skin thickening in the areolar/subareolar left breast.  There was an indeterminate lymph node within the axillary tail of the right breast.    Digital screening right mammogram on 06/23/2017 revealed no evidence of malignancy. Left axillary ultrasound on 07/06/2017 revealed no sonographic correlation to the area of palpable concern inferior to the left mastectomy scar, described by the patient.  Ultrasound of the right axillae on 07/10/2016 revealed a stable to slightly smaller intramammary lymph node (1 cm) compared to prior imaging.  Family history is notable breast cancer (maternal 1st cousin), prostate cancer (father, brother x 2), and colon cancer (mother).  She is not interested in genetic testing.  Symptomatically, patient doing well.  She has no complaints. BP elevated in clinic today at 203/67. She takes her antihypertensives at night. Exam revealed stable fibrocystic changes in RIGHT breast. Mastectomy site on LEFT unremarkable.  Labs unremarkable.  Ferritin is 38.  Plan: 1.  Labs today:  CBC with diff, CMP. 2.  Left breast DCIS:  Patient elected not to pursue endocrine therapy.  Anticipate right sided mammogram on 06/23/2018.   2.  Review interval mammogram and ultrasound- no evidence of disease. 3.  Discuss consideration of genetic testing given family history.  Patient concerned about cost. No longer interested in pursuing. 4.  Discuss hormonal therapy. Patient elected not to pursue therapy.  5.  Discuss elevated  blood pressure. Her baseline SBP runs in the 160s. She did not take her medications today. Discussed evaluation in ER (declined) or f/u with PCP. Patient recently saw PCP and had another medication added. Subsequent checks at PCP office has been "ok".  6.  Discuss iron stores. Patient notes that she is no longer taking her oral iron.  7.  RTC in 6 months for MD assessment and labs (CBC with diff, CMP).   Lequita Asal, MD  03/15/2018, 6:11 AM   I saw and evaluated the patient, participating in the key portions of the service and reviewing pertinent diagnostic studies and records.  I reviewed the nurse practitioner's note and agree with the findings and the plan.  The assessment and plan were discussed with the patient.  A few questions were asked by the patient and answered.   Nolon Stalls, MD 03/15/2018, 6:11 AM

## 2018-04-19 ENCOUNTER — Ambulatory Visit (INDEPENDENT_AMBULATORY_CARE_PROVIDER_SITE_OTHER): Payer: Medicare Other | Admitting: Vascular Surgery

## 2018-04-19 ENCOUNTER — Encounter (INDEPENDENT_AMBULATORY_CARE_PROVIDER_SITE_OTHER): Payer: Self-pay | Admitting: Vascular Surgery

## 2018-04-19 VITALS — BP 184/78 | HR 63 | Resp 16 | Ht 63.0 in | Wt 168.6 lb

## 2018-04-19 DIAGNOSIS — E119 Type 2 diabetes mellitus without complications: Secondary | ICD-10-CM | POA: Diagnosis not present

## 2018-04-19 DIAGNOSIS — I70229 Atherosclerosis of native arteries of extremities with rest pain, unspecified extremity: Secondary | ICD-10-CM | POA: Insufficient documentation

## 2018-04-19 DIAGNOSIS — M79605 Pain in left leg: Secondary | ICD-10-CM

## 2018-04-19 DIAGNOSIS — M79609 Pain in unspecified limb: Secondary | ICD-10-CM | POA: Insufficient documentation

## 2018-04-19 DIAGNOSIS — I70219 Atherosclerosis of native arteries of extremities with intermittent claudication, unspecified extremity: Secondary | ICD-10-CM | POA: Insufficient documentation

## 2018-04-19 DIAGNOSIS — I739 Peripheral vascular disease, unspecified: Secondary | ICD-10-CM | POA: Diagnosis not present

## 2018-04-19 DIAGNOSIS — I10 Essential (primary) hypertension: Secondary | ICD-10-CM

## 2018-04-19 NOTE — Assessment & Plan Note (Signed)
Pain is not typical for PAD, but the numbers from her primary care physician's studies are significantly reduced.  She complains more of knee pain so this may be a musculoskeletal issue and orthopedic evaluation may be of benefit as well.  We are going to do some more detailed studies to assess her PAD

## 2018-04-19 NOTE — Assessment & Plan Note (Signed)
She had some sort of basic noninvasive studies performed by her primary care physician where the pressure in the right foot was measured at 0.6 and in the left foot 0.21.  The waveforms that are listed are actually not all that bad so I am not sure what to make of those markedly reduced pressures. Her symptoms are not entirely classic for PAD, but certainly they may be exacerbated by significantly poor perfusion.  We will get some more detailed studies in the near future at her convenience including arterial duplex.  I will see the patient back following the studies to discuss the results and determine further treatment options.  We did discuss the pathophysiology and natural history of peripheral arterial disease today.

## 2018-04-19 NOTE — Assessment & Plan Note (Signed)
blood pressure control important in reducing the progression of atherosclerotic disease. On appropriate oral medications.  

## 2018-04-19 NOTE — Patient Instructions (Signed)

## 2018-04-19 NOTE — Assessment & Plan Note (Signed)
blood glucose control important in reducing the progression of atherosclerotic disease. Also, involved in wound healing. On appropriate medications.  

## 2018-04-19 NOTE — Progress Notes (Signed)
Patient ID: Lynn Wood, female   DOB: Jan 27, 1946, 72 y.o.   MRN: [id number]  Chief Complaint  Patient presents with  . New Patient (Initial Visit)    ref Inis Sizer for PVD    HPI Lynn Wood is a 72 y.o. female.  I am asked to see the patient by Dr. Ellison Hughs for evaluation of PAD.  The patient reports having pain mostly in her left knee.  This is painful all the time and not necessarily related to activity.  She does not really have any history of ulceration or infection.  Some swelling around the knee but not in the leg itself.  No real right leg symptoms.  Symptoms are not really typical for claudication and the pain is not really in her foot like ischemic rest pain.  She had some sort of basic noninvasive studies performed by her primary care physician where the pressure in the right foot was measured at 0.6 and in the left foot 0.21.  The waveforms that are listed are actually not all that bad so I am not sure what to make of those markedly reduced pressures.  Given those findings, she is referred for further evaluation and treatment   Past Medical History:  Diagnosis Date  . Benign neoplasm of colon   . Cancer ([place]) 06/2016   left breast  . Carpal tunnel syndrome   . DCIS (ductal carcinoma in situ) of breast   . Diabetes mellitus type 2, uncomplicated ([place])   . Hypertension   . Nontoxic uninodular goiter   . Pre-diabetes   . Pure hypercholesterolemia   . Vaginal vault prolapse   . Vitamin D deficiency     Past Surgical History:  Procedure Laterality Date  . COLONOSCOPY WITH PROPOFOL N/A 01/22/2015   Procedure: COLONOSCOPY WITH PROPOFOL;  Surgeon: Lollie Sails, MD;  Location: [facility] ENDOSCOPY;  Service: Endoscopy;  Laterality: N/A;  . GYNECOLOGIC CRYOSURGERY    . INCISION AND DRAINAGE ABSCESS Left 07/29/2016   Procedure: Reexploration of left breast mastectomy site;  Surgeon: Jules Husbands, MD;  Location: [facility] ORS;  Service: General;  Laterality: Left;  Marland Kitchen  MASTECTOMY Left    March 2018  . MASTECTOMY W/ SENTINEL NODE BIOPSY Left 07/28/2016   Procedure: MASTECTOMY WITH SENTINEL LYMPH NODE BIOPSY;  Surgeon: Jules Husbands, MD;  Location: [facility] ORS;  Service: General;  Laterality: Left;  Marland Kitchen MULTIPLE TOOTH EXTRACTIONS  1997    Family History  Problem Relation Age of Onset  . Breast cancer Cousin   . Colon cancer Mother   . Prostate cancer Father   . Prostate cancer Brother   . Heart disease Daughter     Social History Social History   Tobacco Use  . Smoking status: Light Tobacco Smoker    Packs/day: 0.00    Types: Cigarettes  . Smokeless tobacco: Never Used  . Tobacco comment: 1 cigarette every 1-2 weeks  Substance Use Topics  . Alcohol use: No  . Drug use: No     Allergies  Allergen Reactions  . Lipitor [Atorvastatin] Other (See Comments)    Arthalgia  . Lipofen [Fenofibrate] Other (See Comments)    Arthalgia  . Mevacor [Lovastatin] Other (See Comments)    Arthalgia  . Zetia [Ezetimibe] Other (See Comments)    Arthalgia  . Zocor [Simvastatin] Other (See Comments)    Arthalgia    Current Outpatient Medications  Medication Sig Dispense Refill  . amLODipine (NORVASC) 5 MG tablet Take 1 tablet  by mouth daily.    Marland Kitchen aspirin EC 81 MG tablet Take 1 tablet by mouth 1 day or 1 dose.    Marland Kitchen atenolol (TENORMIN) 50 MG tablet Take 1 tablet (50 mg total) by mouth once daily.    . Cholecalciferol (VITAMIN D) 2000 units CAPS Take 1 capsule by mouth daily.    . clobetasol cream (TEMOVATE) 4.40 % Apply 1 application topically 2 (two) times daily as needed.     . fluticasone (FLONASE) 50 MCG/ACT nasal spray Place 1 spray into both nostrils daily as needed for allergies.     Marland Kitchen lisinopril (PRINIVIL,ZESTRIL) 20 MG tablet Take 1 tablet (20 mg total) by mouth once daily.    . meloxicam (MOBIC) 15 MG tablet Take 1 tablet by mouth as needed.     . Omega-3 Fatty Acids (FISH OIL PO) Take 1 capsule by mouth 1 day or 1 dose.    . docusate sodium  (COLACE) 100 MG capsule Take 1 capsule (100 mg total) by mouth 2 (two) times daily. (Patient not taking: Reported on 04/19/2018) 10 capsule 0  . lisinopril-hydrochlorothiazide (PRINZIDE,ZESTORETIC) 20-12.5 MG tablet Take 1 tablet by mouth daily.     No current facility-administered medications for this visit.       REVIEW OF SYSTEMS (Negative unless checked)  Constitutional: [] Weight loss  [] Fever  [] Chills Cardiac: [] Chest pain   [] Chest pressure   [] Palpitations   [] Shortness of breath when laying flat   [] Shortness of breath at rest   [] Shortness of breath with exertion. Vascular:  [x] Pain in legs with walking   [] Pain in legs at rest   [] Pain in legs when laying flat   [x] Claudication   [] Pain in feet when walking  [] Pain in feet at rest  [] Pain in feet when laying flat   [] History of DVT   [] Phlebitis   [] Swelling in legs   [] Varicose veins   [] Non-healing ulcers Pulmonary:   [] Uses home oxygen   [] Productive cough   [] Hemoptysis   [] Wheeze  [] COPD   [] Asthma Neurologic:  [] Dizziness  [] Blackouts   [] Seizures   [] History of stroke   [] History of TIA  [] Aphasia   [] Temporary blindness   [] Dysphagia   [] Weakness or numbness in arms   [] Weakness or numbness in legs Musculoskeletal:  [x] Arthritis   [x] Joint swelling   [x] Joint pain   [] Low back pain Hematologic:  [] Easy bruising  [] Easy bleeding   [] Hypercoagulable state   [] Anemic  [] Hepatitis Gastrointestinal:  [] Blood in stool   [] Vomiting blood  [x] Gastroesophageal reflux/heartburn   [] Abdominal pain Genitourinary:  [] Chronic kidney disease   [] Difficult urination  [] Frequent urination  [] Burning with urination   [] Hematuria Skin:  [] Rashes   [] Ulcers   [] Wounds Psychological:  [] History of anxiety   []  History of major depression.    Physical Exam BP (!) 184/78 (BP Location: Right Arm)   Pulse 63   Resp 16   Ht 5\' 3"  (1.6 m)   Wt 168 lb 9.6 oz (76.5 kg)   BMI 29.87 kg/m  Gen:  WD/WN, NAD Head: [place]/AT, No temporalis wasting.  Prominent temp pulse not noted. Ear/Nose/Throat: Hearing grossly intact, nares w/o erythema or drainage, oropharynx w/o Erythema/Exudate Eyes: Conjunctiva clear, sclera non-icteric  Neck: trachea midline.  Pulmonary:  Good air movement, respirations not labored Cardiac: RRR, no JVD Vascular:  Vessel Right Left  Radial Palpable Palpable  PT 1+ Palpable Not Palpable  DP 1+ Palpable 1+ Palpable   Gastrointestinal: soft, non-tender/non-distended. Musculoskeletal: M/S 5/5 throughout.  No deformity or atrophy. No edema. Neurologic: Sensation grossly intact in extremities.  Symmetrical.  Speech is fluent. Motor exam as listed above. Psychiatric: Judgment intact, Mood & affect appropriate for pt's clinical situation. Dermatologic: No rashes or ulcers noted.  No cellulitis or open wounds.    Radiology No results found.  Labs No results found for this or any previous visit (from the past 2160 hour(s)).  Assessment/Plan:  Benign essential hypertension blood pressure control important in reducing the progression of atherosclerotic disease. On appropriate oral medications.   Diabetes mellitus type 2, uncomplicated (HCC) blood glucose control important in reducing the progression of atherosclerotic disease. Also, involved in wound healing. On appropriate medications.   Pain in limb Pain is not typical for PAD, but the numbers from her primary care physician's studies are significantly reduced.  She complains more of knee pain so this may be a musculoskeletal issue and orthopedic evaluation may be of benefit as well.  We are going to do some more detailed studies to assess her PAD  PAD (peripheral artery disease) ([place]) She had some sort of basic noninvasive studies performed by her primary care physician where the pressure in the right foot was measured at 0.6 and in the left foot 0.21.  The waveforms that are listed are actually not all that bad so I am not  sure what to make of those markedly reduced pressures. Her symptoms are not entirely classic for PAD, but certainly they may be exacerbated by significantly poor perfusion.  We will get some more detailed studies in the near future at her convenience including arterial duplex.  I will see the patient back following the studies to discuss the results and determine further treatment options.  We did discuss the pathophysiology and natural history of peripheral arterial disease today.      Leotis Pain 04/19/2018, 12:40 PM   This note was created with Dragon medical transcription system.  Any errors from dictation are unintentional.

## 2018-05-11 ENCOUNTER — Encounter (INDEPENDENT_AMBULATORY_CARE_PROVIDER_SITE_OTHER): Payer: Self-pay | Admitting: Nurse Practitioner

## 2018-05-11 ENCOUNTER — Ambulatory Visit (INDEPENDENT_AMBULATORY_CARE_PROVIDER_SITE_OTHER): Payer: Medicare Other

## 2018-05-11 ENCOUNTER — Ambulatory Visit (INDEPENDENT_AMBULATORY_CARE_PROVIDER_SITE_OTHER): Payer: Medicare Other | Admitting: Nurse Practitioner

## 2018-05-11 VITALS — BP 180/77 | HR 77 | Resp 16 | Wt 165.6 lb

## 2018-05-11 DIAGNOSIS — M1712 Unilateral primary osteoarthritis, left knee: Secondary | ICD-10-CM | POA: Diagnosis not present

## 2018-05-11 DIAGNOSIS — E119 Type 2 diabetes mellitus without complications: Secondary | ICD-10-CM

## 2018-05-11 DIAGNOSIS — I739 Peripheral vascular disease, unspecified: Secondary | ICD-10-CM

## 2018-05-11 DIAGNOSIS — F17210 Nicotine dependence, cigarettes, uncomplicated: Secondary | ICD-10-CM | POA: Diagnosis not present

## 2018-05-11 NOTE — Progress Notes (Signed)
Subjective:    Patient ID: Lynn Wood, female    DOB: 28-Feb-1946, 72 y.o.   MRN: [id number] Chief Complaint  Patient presents with  . Follow-up    ultrasound follow up    HPI  Lynn Wood is a 72 y.o. female presents today with complaints of left lower extremity leg pain.  She states that she has had this pain for a number of months.  The pain is different than the knee pain she experiences in her right lower extremity.  She states that the pain is worse with ambulation, which requires her to stop and rest at frequent intervals.  She endorses having a limp. No new ulcers or wounds have occurred since the last visit.  There have been no significant changes to the patient's overall health care.  The patient denies amaurosis fugax or recent TIA symptoms. There are no recent neurological changes noted. The patient denies history of DVT, PE or superficial thrombophlebitis. The patient denies recent episodes of angina or shortness of breath.   ABI's Rt=1.03 and Lt=0.59 (previous ABI's Rt=0.6 and Lt=0.21, reported from PCP office) Duplex US of the lower extremity arterial system shows biphasic waveforms throughout the right lower extremity.  The left lower extremity has biphasic waveforms down to the level of the mid SFA where it transitions to monophasic waveforms.  Past Medical History:  Diagnosis Date  . Benign neoplasm of colon   . Cancer ([place]) 06/2016   left breast  . Carpal tunnel syndrome   . DCIS (ductal carcinoma in situ) of breast   . Diabetes mellitus type 2, uncomplicated ([place])   . Hypertension   . Nontoxic uninodular goiter   . Pre-diabetes   . Pure hypercholesterolemia   . Vaginal vault prolapse   . Vitamin D deficiency     Past Surgical History:  Procedure Laterality Date  . COLONOSCOPY WITH PROPOFOL N/A 01/22/2015   Procedure: COLONOSCOPY WITH PROPOFOL;  Surgeon: Lollie Sails, MD;  Location: [facility] ENDOSCOPY;  Service: Endoscopy;  Laterality: N/A;  .  GYNECOLOGIC CRYOSURGERY    . INCISION AND DRAINAGE ABSCESS Left 07/29/2016   Procedure: Reexploration of left breast mastectomy site;  Surgeon: Jules Husbands, MD;  Location: [facility] ORS;  Service: General;  Laterality: Left;  Marland Kitchen MASTECTOMY Left    March 2018  . MASTECTOMY W/ SENTINEL NODE BIOPSY Left 07/28/2016   Procedure: MASTECTOMY WITH SENTINEL LYMPH NODE BIOPSY;  Surgeon: Jules Husbands, MD;  Location: [facility] ORS;  Service: General;  Laterality: Left;  Marland Kitchen MULTIPLE TOOTH EXTRACTIONS  1997    Social History   Socioeconomic History  . Marital status: Married    Spouse name: Not on file  . Number of children: 2  . Years of education: 47  . Highest education level: Not on file  Occupational History  . 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 Financial resource strain: Not on file  . Food insecurity:    Worry: Not on file    Inability: Not on file  . Transportation needs:    Medical: Not on file    Non-medical: Not on file  Tobacco Use  . Smoking status: Light Tobacco Smoker    Packs/day: 0.00    Types: Cigarettes  . Smokeless tobacco: Never Used  . Tobacco comment: 1 cigarette every 1-2 weeks  Substance and Sexual Activity  . Alcohol use: No  . Drug use: No  . Sexual activity: Not on file  Lifestyle  . Physical activity:  Days per week: Not on file    Minutes per session: Not on file  . Stress: Not on file  Relationships  . Social connections:    Talks on phone: Not on file    Gets together: Not on file    Attends religious service: Not on file    Active member of club or organization: Not on file    Attends meetings of clubs or organizations: Not on file    Relationship status: Not on file  . Intimate partner violence:    Fear of current or ex partner: Not on file    Emotionally abused: Not on file    Physically abused: Not on file    Forced sexual activity: Not on file  Other Topics Concern  . Not on file  Social History Narrative   Lives with husband in a one story  home.  Has 2 children.  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  Education: college.     Family History  Problem Relation Age of Onset  . Breast cancer Cousin   . Colon cancer Mother   . Prostate cancer Father   . Prostate cancer Brother   . Heart disease Daughter     Allergies  Allergen Reactions  . Lipitor [Atorvastatin] Other (See Comments)    Arthalgia  . Lipofen [Fenofibrate] Other (See Comments)    Arthalgia  . Mevacor [Lovastatin] Other (See Comments)    Arthalgia  . Zetia [Ezetimibe] Other (See Comments)    Arthalgia  . Zocor [Simvastatin] Other (See Comments)    Arthalgia     Review of Systems   Review of Systems: Negative Unless Checked Constitutional: [] Weight loss  [] Fever  [] Chills Cardiac: [] Chest pain   []  Atrial Fibrillation  [] Palpitations   [] Shortness of breath when laying flat   [] Shortness of breath with exertion. Vascular:  [x] Pain in legs with walking   [x] Pain in legs with standing  [] History of DVT   [] Phlebitis   [] Swelling in legs   [] Varicose veins   [] Non-healing ulcers Pulmonary:   [] Uses home oxygen   [] Productive cough   [] Hemoptysis   [] Wheeze  [] COPD   [] Asthma Neurologic:  [] Dizziness   [] Seizures   [] History of stroke   [] History of TIA  [] Aphasia   [] Vissual changes   [] Weakness or numbness in arm   [x] Weakness or numbness in leg Musculoskeletal:   [] Joint swelling   [x] Joint pain   [] Low back pain  []  History of Knee Replacement Hematologic:  [] Easy bruising  [] Easy bleeding   [] Hypercoagulable state   [] Anemic Gastrointestinal:  [] Diarrhea   [] Vomiting  [] Gastroesophageal reflux/heartburn   [] Difficulty swallowing. Genitourinary:  [] Chronic kidney disease   [] Difficult urination  [] Anuric   [] Blood in urine Skin:  [] Rashes   [] Ulcers  Psychological:  [] History of anxiety   []  History of major depression  []  Memory Difficulties     Objective:   Physical Exam  BP (!) 180/77 (BP  Location: Right Arm)   Pulse 77   Resp 16   Wt 165 lb 9.6 oz (75.1 kg)   BMI 29.33 kg/m   Gen: WD/WN, NAD Head: [place]/AT, No temporalis wasting.  Ear/Nose/Throat: Hearing grossly intact, nares w/o erythema or drainage Eyes: PER, EOMI, sclera nonicteric.  Neck: Supple, no masses.  No JVD.  Pulmonary:  Good air movement, no use of accessory muscles.  Cardiac: RRR Vascular:  Vessel Right Left  Radial Palpable Palpable  Dorsalis Pedis Palpable Not Palpable  Posterior Tibial Palpable Not Palpable   Gastrointestinal: soft, non-distended. No guarding/no peritoneal signs.  Musculoskeletal: M/S 5/5 throughout.  No deformity or atrophy.  Neurologic: Pain and light touch intact in extremities.  Symmetrical.  Speech is fluent. Motor exam as listed above. Psychiatric: Judgment intact, Mood & affect appropriate for pt's clinical situation. Dermatologic: No Venous rashes. No Ulcers Noted.  No changes consistent with cellulitis. Lymph : No Cervical lymphadenopathy, no lichenification or skin changes of chronic lymphedema.      Assessment & Plan:   1. PAD (peripheral artery disease) (HCC) Recommend:  The patient has experienced increased symptoms and is now describing lifestyle limiting claudication and mild rest pain.   Given the severity of the patient's left lower extremity symptoms the patient should undergo angiography and intervention.  Risk and benefits were reviewed the patient.  Indications for the procedure were reviewed.  All questions were answered, the patient agrees to proceed.   The patient should continue walking and begin a more formal exercise program.  The patient should continue antiplatelet therapy and aggressive treatment of the lipid abnormalities  The patient will follow up with me after the angiogram.   2. Type 2 diabetes mellitus without complication, without long-term current use of insulin (HCC) Continue hypoglycemic medications as already ordered, these  medications have been reviewed and there are no changes at this time.  Hgb A1C to be monitored as already arranged by primary service   3. Primary osteoarthritis of left knee Continue NSAID medications as already ordered, these medications have been reviewed and there are no changes at this time.  Continued activity and therapy was stressed.    Current Outpatient Medications on File Prior to Visit  Medication Sig Dispense Refill  . amLODipine (NORVASC) 5 MG tablet Take 1 tablet by mouth daily.    Marland Kitchen aspirin EC 81 MG tablet Take 1 tablet by mouth 1 day or 1 dose.    Marland Kitchen atenolol (TENORMIN) 50 MG tablet Take 1 tablet (50 mg total) by mouth once daily.    . Cholecalciferol (VITAMIN D) 2000 units CAPS Take 1 capsule by mouth daily.    . clobetasol cream (TEMOVATE) 3.84 % Apply 1 application topically 2 (two) times daily as needed.     . fluticasone (FLONASE) 50 MCG/ACT nasal spray Place 1 spray into both nostrils daily as needed for allergies.     Marland Kitchen lisinopril (PRINIVIL,ZESTRIL) 20 MG tablet Take 1 tablet (20 mg total) by mouth once daily.    . meloxicam (MOBIC) 15 MG tablet Take 1 tablet by mouth as needed.     . Omega-3 Fatty Acids (FISH OIL PO) Take 1 capsule by mouth 1 day or 1 dose.    . docusate sodium (COLACE) 100 MG capsule Take 1 capsule (100 mg total) by mouth 2 (two) times daily. (Patient not taking: Reported on 04/19/2018) 10 capsule 0  . lisinopril-hydrochlorothiazide (PRINZIDE,ZESTORETIC) 20-12.5 MG tablet Take 1 tablet by mouth daily.     No current facility-administered medications on file prior to visit.     There are no Patient Instructions on file for this visit. No follow-ups on file.   Kris Hartmann, NP  This note was completed with Sales executive.  Any errors are purely unintentional.

## 2018-05-16 ENCOUNTER — Other Ambulatory Visit: Payer: Self-pay

## 2018-05-16 DIAGNOSIS — Z1231 Encounter for screening mammogram for malignant neoplasm of breast: Secondary | ICD-10-CM

## 2018-05-19 ENCOUNTER — Other Ambulatory Visit: Payer: Self-pay

## 2018-05-19 ENCOUNTER — Encounter: Payer: Self-pay | Admitting: *Deleted

## 2018-05-19 ENCOUNTER — Ambulatory Visit
Admission: RE | Admit: 2018-05-19 | Discharge: 2018-05-19 | Disposition: A | Discharge status: Home / Self Care | Payer: Medicare Other | Source: Ambulatory Visit | Attending: Vascular Surgery | Admitting: Vascular Surgery

## 2018-05-19 ENCOUNTER — Encounter
Admission: RE | Disposition: A | Discharge status: Home / Self Care | Payer: Self-pay | Source: Ambulatory Visit | Attending: Vascular Surgery

## 2018-05-19 DIAGNOSIS — E1151 Type 2 diabetes mellitus with diabetic peripheral angiopathy without gangrene: Secondary | ICD-10-CM | POA: Diagnosis not present

## 2018-05-19 DIAGNOSIS — Z7951 Long term (current) use of inhaled steroids: Secondary | ICD-10-CM | POA: Insufficient documentation

## 2018-05-19 DIAGNOSIS — I70222 Atherosclerosis of native arteries of extremities with rest pain, left leg: Secondary | ICD-10-CM | POA: Diagnosis not present

## 2018-05-19 DIAGNOSIS — I10 Essential (primary) hypertension: Secondary | ICD-10-CM | POA: Insufficient documentation

## 2018-05-19 DIAGNOSIS — F17210 Nicotine dependence, cigarettes, uncomplicated: Secondary | ICD-10-CM | POA: Insufficient documentation

## 2018-05-19 DIAGNOSIS — Z7982 Long term (current) use of aspirin: Secondary | ICD-10-CM | POA: Insufficient documentation

## 2018-05-19 DIAGNOSIS — M1712 Unilateral primary osteoarthritis, left knee: Secondary | ICD-10-CM | POA: Diagnosis not present

## 2018-05-19 DIAGNOSIS — I70219 Atherosclerosis of native arteries of extremities with intermittent claudication, unspecified extremity: Secondary | ICD-10-CM

## 2018-05-19 DIAGNOSIS — E7800 Pure hypercholesterolemia, unspecified: Secondary | ICD-10-CM | POA: Diagnosis not present

## 2018-05-19 HISTORY — PX: LOWER EXTREMITY ANGIOGRAPHY: CATH118251

## 2018-05-19 LAB — CREATININE, SERUM: CREATININE: 0.67 mg/dL (ref 0.44–1.00)

## 2018-05-19 LAB — BUN: BUN: 19 mg/dL (ref 8–23)

## 2018-05-19 SURGERY — LOWER EXTREMITY ANGIOGRAPHY
Anesthesia: Moderate Sedation | Site: Leg Lower | Laterality: Left

## 2018-05-19 MED ORDER — MIDAZOLAM HCL 5 MG/5ML IJ SOLN
INTRAMUSCULAR | Status: AC
Start: 2018-05-19 — End: ?
  Filled 2018-05-19: qty 10

## 2018-05-19 MED ORDER — SODIUM CHLORIDE 0.9% FLUSH: 3.0000 mL | INTRAVENOUS | Status: DC | PRN

## 2018-05-19 MED ORDER — SODIUM CHLORIDE 0.9 % IV SOLN: 250.0000 mL | INTRAVENOUS | Status: DC | PRN

## 2018-05-19 MED ORDER — LABETALOL HCL 5 MG/ML IV SOLN: 10.0000 mg | INTRAVENOUS | Status: DC | PRN

## 2018-05-19 MED ORDER — CLOPIDOGREL BISULFATE 75 MG PO TABS: 75.0000 mg | ORAL_TABLET | Freq: Every day | ORAL | 11 refills | Status: DC

## 2018-05-19 MED ORDER — ACETAMINOPHEN 325 MG PO TABS: 650.0000 mg | ORAL_TABLET | ORAL | Status: DC | PRN

## 2018-05-19 MED ORDER — ONDANSETRON HCL 4 MG/2ML IJ SOLN: 4.0000 mg | Freq: Four times a day (QID) | INTRAMUSCULAR | Status: DC | PRN

## 2018-05-19 MED ORDER — CEFAZOLIN SODIUM-DEXTROSE 2-4 GM/100ML-% IV SOLN
2.0000 g | INTRAVENOUS | Status: AC
  Administered 2018-05-19: 2 g via INTRAVENOUS

## 2018-05-19 MED ORDER — SODIUM CHLORIDE 0.9 % IV SOLN: INTRAVENOUS | Status: DC

## 2018-05-19 MED ORDER — FENTANYL CITRATE (PF) 100 MCG/2ML IJ SOLN
INTRAMUSCULAR | Status: DC | PRN
  Administered 2018-05-19 (×2): 25 ug via INTRAVENOUS
  Administered 2018-05-19: 50 ug via INTRAVENOUS
  Administered 2018-05-19: 12.5 ug via INTRAVENOUS

## 2018-05-19 MED ORDER — HYDRALAZINE HCL 20 MG/ML IJ SOLN: 5.0000 mg | INTRAMUSCULAR | Status: DC | PRN

## 2018-05-19 MED ORDER — HEPARIN SODIUM (PORCINE) 1000 UNIT/ML IJ SOLN
INTRAMUSCULAR | Status: DC | PRN
  Administered 2018-05-19: 5000 [IU] via INTRAVENOUS

## 2018-05-19 MED ORDER — CLOPIDOGREL BISULFATE 75 MG PO TABS
75.0000 mg | ORAL_TABLET | Freq: Every day | ORAL | Status: DC
  Administered 2018-05-19: 75 mg via ORAL

## 2018-05-19 MED ORDER — FENTANYL CITRATE (PF) 100 MCG/2ML IJ SOLN
INTRAMUSCULAR | Status: AC
  Filled 2018-05-19: qty 4

## 2018-05-19 MED ORDER — SODIUM CHLORIDE 0.9 % IV SOLN
INTRAVENOUS | Status: DC
  Administered 2018-05-19: 11:00:00 via INTRAVENOUS

## 2018-05-19 MED ORDER — CLOPIDOGREL BISULFATE 75 MG PO TABS
ORAL_TABLET | ORAL | Status: AC
  Filled 2018-05-19: qty 1

## 2018-05-19 MED ORDER — LIDOCAINE-EPINEPHRINE (PF) 1 %-1:200000 IJ SOLN
INTRAMUSCULAR | Status: AC
  Filled 2018-05-19: qty 10

## 2018-05-19 MED ORDER — HEPARIN SODIUM (PORCINE) 1000 UNIT/ML IJ SOLN
INTRAMUSCULAR | Status: AC
  Filled 2018-05-19: qty 1

## 2018-05-19 MED ORDER — HEPARIN (PORCINE) IN NACL 1000-0.9 UT/500ML-% IV SOLN
INTRAVENOUS | Status: AC
  Filled 2018-05-19: qty 1000

## 2018-05-19 MED ORDER — MIDAZOLAM HCL 2 MG/2ML IJ SOLN
INTRAMUSCULAR | Status: DC | PRN
  Administered 2018-05-19 (×2): 0.5 mg via INTRAVENOUS
  Administered 2018-05-19: 1 mg via INTRAVENOUS
  Administered 2018-05-19: 2 mg via INTRAVENOUS

## 2018-05-19 MED ORDER — SODIUM CHLORIDE 0.9% FLUSH: 3.0000 mL | Freq: Two times a day (BID) | INTRAVENOUS | Status: DC

## 2018-05-19 SURGICAL SUPPLY — 15 items
BALLN LUTONIX 5X220X130 (BALLOONS) ×3
BALLN ULTRVRSE 3X220X150 (BALLOONS) ×3
BALLOON LUTONIX 5X220X130 (BALLOONS) ×1 IMPLANT
BALLOON ULTRVRSE 3X220X150 (BALLOONS) ×1 IMPLANT
CATH BEACON 5 .038 100 VERT TP (CATHETERS) ×3 IMPLANT
CATH PIG 70CM (CATHETERS) ×3 IMPLANT
DEVICE PRESTO INFLATION (MISCELLANEOUS) ×3 IMPLANT
DEVICE STARCLOSE SE CLOSURE (Vascular Products) ×3 IMPLANT
GLIDEWIRE ADV .035X260CM (WIRE) ×3 IMPLANT
PACK ANGIOGRAPHY (CUSTOM PROCEDURE TRAY) ×3 IMPLANT
SHEATH ANL2 6FRX45 HC (SHEATH) ×3 IMPLANT
SHEATH BRITE TIP 5FRX11 (SHEATH) ×3 IMPLANT
TUBING CONTRAST HIGH PRESS 72 (TUBING) ×3 IMPLANT
WIRE G V18X300CM (WIRE) ×3 IMPLANT
WIRE J 3MM .035X145CM (WIRE) ×3 IMPLANT

## 2018-05-19 NOTE — H&P (Signed)
[facility] History & Physical Update  The patient was interviewed and re-examined.  The patient's previous History and Physical has been reviewed and is unchanged.  There is no change in the plan of care. We plan to proceed with the scheduled procedure.  Leotis Pain, MD  05/19/2018, 10:32 AM

## 2018-05-19 NOTE — Discharge Instructions (Signed)

## 2018-05-19 NOTE — Op Note (Signed)
[facility]  Percutaneous Study/Intervention Procedural Note   Date of Surgery: 05/19/2018  Surgeon(s):Ryna Beckstrom    Assistants:none  Pre-operative Diagnosis: PAD with rest Wood left lower extremity  Post-operative diagnosis:  Same  Procedure(s) Performed:             1.  Ultrasound guidance for vascular access right femoral artery             2.  Catheter placement into left SFA from right femoral approach             3.  Aortogram and selective left lower extremity angiogram             4.  Percutaneous transluminal angioplasty of left anterior tibial artery and distal popliteal artery with 3 mm diameter by 22 cm length angioplasty balloon             5.   Percutaneous transluminal angioplasty of the left distal SFA and popliteal artery with 5 mm diameter by 22 cm length Lutonix drug-coated angioplasty balloon  6.   StarClose closure device right femoral artery  EBL: 5 cc  Contrast: 40 cc  Fluoro Time: 2.8 minutes  Moderate Conscious Sedation Time: approximately 25 minutes using 4 mg of Versed and 112.5 mcg of Fentanyl              Indications:  Patient is a 72 y.o.female with rest Wood of the left leg. The patient has noninvasive study showing markedly reduced left ABI. The patient is brought in for angiography for further evaluation and potential treatment.  Due to the limb threatening nature of the situation, angiogram was performed for attempted limb salvage. The patient is aware that if the procedure fails, amputation would be expected.  The patient also understands that even with successful revascularization, amputation may still be required due to the severity of the situation. Risks and benefits are discussed and informed consent is obtained.   Procedure:  The patient was identified and appropriate procedural time out was performed.  The patient was then placed supine on the table and prepped and draped in the usual sterile fashion. Moderate conscious  sedation was administered during a face to face encounter with the patient throughout the procedure with my supervision of the RN administering medicines and monitoring the patient's vital signs, pulse oximetry, telemetry and mental status throughout from the start of the procedure until the patient was taken to the recovery room. Ultrasound was used to evaluate the right common femoral artery.  It was patent .  A digital ultrasound image was acquired.  A Seldinger needle was used to access the right common femoral artery under direct ultrasound guidance and a permanent image was performed.  A 0.035 J wire was advanced without resistance and a 5Fr sheath was placed.  Pigtail catheter was placed into the aorta and an AP aortogram was performed. This demonstrated normal renal arteries and normal aorta and iliac segments without significant stenosis. I then crossed the aortic bifurcation and advanced to the left femoral head.  The pigtail catheter was then advanced into the proximal left superficial femoral artery to help opacify distally for the diagnostic portion of this procedure.  Selective left lower extremity angiogram was then performed. This demonstrated normal common femoral artery, profunda femoris artery, and proximal superficial femoral artery.  In the distal SFA, the artery became quite irregular and had about a 70 to 80% stenosis.  The above-knee popliteal artery had a short segment occlusion with reconstitution  just above the knee.  There was then some irregularity down to a true tibial trifurcation.  The anterior tibial artery had about a 60% stenosis proximally and then about an 85 to 90% stenosis in the midsegment.  The peroneal artery was small but without focal stenosis identified.  The posterior tibial artery was continuous to the foot without focal stenosis of greater than 50%. It was felt that it was in the patient's best interest to proceed with intervention after these images to avoid a second  procedure and a larger amount of contrast and fluoroscopy based off of the findings from the initial angiogram. The patient was systemically heparinized and a 6 [place] Ansell sheath was then placed over the [employer] wire. I then used a Kumpe catheter and the advantage wire to navigate through the SFA and popliteal lesions and confirm intraluminal flow in the distal popliteal artery.  I then exchanged for a 0.018 wire and with the help of the Kumpe catheter was able to cross the anterior tibial artery stenosis both proximally and in the midsegment.  I then proceeded with treatment.  A 3 mm diameter by 22 cm length angioplasty balloon was inflated to 12 atm for 1 minute in the proximal and mid anterior tibial artery and back into the distal popliteal artery to encompass both anterior tibial lesions.  Completion imaging following this showed excellent flow with less than 10% stenosis in the anterior tibial artery and both lesions.  I then turned my attention to the distal SFA and popliteal arteries.  A 5 mm diameter by 22 cm length Lutonix drug-coated angioplasty balloon was inflated to 10 atm for 1 minute to encompass the lesions.  Completion imaging following angioplasty demonstrated about a 15 to 20% residual stenosis at worst with no dissection or flow limitation. I elected to terminate the procedure. The sheath was removed and StarClose closure device was deployed in the right femoral artery with excellent hemostatic result. The patient was taken to the recovery room in stable condition having tolerated the procedure well.  Findings:               Aortogram:  normal aorta and iliac arteries.  Normal renal arteries             Left Lower Extremity:  Normal common femoral artery, profunda femoris artery, and proximal superficial femoral artery.  In the distal SFA, the artery became quite irregular and had about a 70 to 80% stenosis.  The above-knee popliteal artery had a short segment occlusion with  reconstitution just above the knee.  There was then some irregularity down to a true tibial trifurcation.  The anterior tibial artery had about a 60% stenosis proximally and then about an 85 to 90% stenosis in the midsegment.  The peroneal artery was small but without focal stenosis identified.  The posterior tibial artery was continuous to the foot without focal stenosis of greater than 50%.   Disposition: Patient was taken to the recovery room in stable condition having tolerated the procedure well.  Complications: None  Lynn Wood 05/19/2018 12:19 PM   This note was created with Dragon Medical transcription system. Any errors in dictation are purely unintentional.

## 2018-05-23 ENCOUNTER — Telehealth (INDEPENDENT_AMBULATORY_CARE_PROVIDER_SITE_OTHER): Payer: Self-pay

## 2018-05-23 NOTE — Telephone Encounter (Signed)
Patient had a LLE angio on 05/19/18 with Dew. She is complaining of leg pain and states it feels as if fever is in her leg.

## 2018-05-23 NOTE — Telephone Encounter (Signed)
As along as the patients leg is not turning blue or pale it is most likely re-profusion syndrome. The patient should elevate her leg and take some tylenol for the discomfort. This is a normal reaction after an angiogram. If the patient notices her pain / erythema is worsening in the next few days she will need to come in with an ABI.

## 2018-05-23 NOTE — Telephone Encounter (Signed)
Spoke with the patient and gave the recommendations of [employer] PA. I did advise that is the symptoms worsened to call us back.

## 2018-06-20 ENCOUNTER — Ambulatory Visit: Payer: Medicare Other | Admitting: Neurology

## 2018-06-21 ENCOUNTER — Other Ambulatory Visit (INDEPENDENT_AMBULATORY_CARE_PROVIDER_SITE_OTHER): Payer: Self-pay | Admitting: Vascular Surgery

## 2018-06-21 DIAGNOSIS — I7090 Unspecified atherosclerosis: Secondary | ICD-10-CM

## 2018-06-24 ENCOUNTER — Ambulatory Visit (INDEPENDENT_AMBULATORY_CARE_PROVIDER_SITE_OTHER): Payer: Medicare Other

## 2018-06-24 ENCOUNTER — Ambulatory Visit: Payer: Medicare Other

## 2018-06-24 ENCOUNTER — Ambulatory Visit (INDEPENDENT_AMBULATORY_CARE_PROVIDER_SITE_OTHER): Payer: Medicare Other | Admitting: Nurse Practitioner

## 2018-06-24 ENCOUNTER — Encounter (INDEPENDENT_AMBULATORY_CARE_PROVIDER_SITE_OTHER): Payer: Self-pay | Admitting: Nurse Practitioner

## 2018-06-24 VITALS — BP 177/79 | HR 66 | Resp 14 | Ht 65.0 in | Wt 163.8 lb

## 2018-06-24 DIAGNOSIS — I739 Peripheral vascular disease, unspecified: Secondary | ICD-10-CM

## 2018-06-24 DIAGNOSIS — I7090 Unspecified atherosclerosis: Secondary | ICD-10-CM

## 2018-06-24 DIAGNOSIS — E119 Type 2 diabetes mellitus without complications: Secondary | ICD-10-CM | POA: Diagnosis not present

## 2018-06-24 DIAGNOSIS — M1712 Unilateral primary osteoarthritis, left knee: Secondary | ICD-10-CM

## 2018-06-28 ENCOUNTER — Encounter (INDEPENDENT_AMBULATORY_CARE_PROVIDER_SITE_OTHER): Payer: Self-pay | Admitting: Nurse Practitioner

## 2018-06-28 NOTE — Progress Notes (Signed)
Subjective:    Patient ID: Lynn Wood, female    DOB: 11/11/1945, 73 y.o.   MRN: [id number] Chief Complaint  Patient presents with  . Follow-up    HPI  Lynn Wood is a 73 y.o. female that presents today after angiogram done on 05/19/2018.  Patient reports that her pain is greatly improved although she still has some persistent pain in her knee.  She denies any fever, chills, nausea, vomiting or diarrhea.  She denies any issues with the vascular access site.  The patient continues to smoke.  The patient underwent bilateral ABIs with an ABI of 0.9 for her right and 1.00 on her left.  She has triphasic waveforms in her bilateral tibial arteries.  She has strong left toe digit pressures with dampened right digit pressures.  Previous studies done on 05/11/2018 have an ABI of 1.03 on her right lower extremity with an ABI of 0.59 on her left.  Past Medical History:  Diagnosis Date  . Benign neoplasm of colon   . Cancer ([place]) 06/2016   left breast  . Carpal tunnel syndrome   . DCIS (ductal carcinoma in situ) of breast   . Diabetes mellitus type 2, uncomplicated ([place])   . Hypertension   . Nontoxic uninodular goiter   . Pre-diabetes   . Pure hypercholesterolemia   . Vaginal vault prolapse   . Vitamin D deficiency     Past Surgical History:  Procedure Laterality Date  . COLONOSCOPY WITH PROPOFOL N/A 01/22/2015   Procedure: COLONOSCOPY WITH PROPOFOL;  Surgeon: Lollie Sails, MD;  Location: [facility] ENDOSCOPY;  Service: Endoscopy;  Laterality: N/A;  . GYNECOLOGIC CRYOSURGERY    . INCISION AND DRAINAGE ABSCESS Left 07/29/2016   Procedure: Reexploration of left breast mastectomy site;  Surgeon: Jules Husbands, MD;  Location: [facility] ORS;  Service: General;  Laterality: Left;  . LOWER EXTREMITY ANGIOGRAPHY Left 05/19/2018   Procedure: LOWER EXTREMITY ANGIOGRAPHY;  Surgeon: Algernon Huxley, MD;  Location: [place] CV LAB;  Service: Cardiovascular;  Laterality: Left;  Marland Kitchen  MASTECTOMY Left    March 2018  . MASTECTOMY W/ SENTINEL NODE BIOPSY Left 07/28/2016   Procedure: MASTECTOMY WITH SENTINEL LYMPH NODE BIOPSY;  Surgeon: Jules Husbands, MD;  Location: [facility] ORS;  Service: General;  Laterality: Left;  Marland Kitchen MULTIPLE TOOTH EXTRACTIONS  1997    Social History   Socioeconomic History  . Marital status: Married    Spouse name: Not on file  . Number of children: 2  . Years of education: 20  . Highest education level: Not on file  Occupational History  . 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 Financial resource strain: Not on file  . Food insecurity:    Worry: Not on file    Inability: Not on file  . Transportation needs:    Medical: Not on file    Non-medical: Not on file  Tobacco Use  . Smoking status: Light Tobacco Smoker    Packs/day: 0.00    Types: Cigarettes  . Smokeless tobacco: Never Used  . Tobacco comment: 1 cigarette every 1-2 weeks  Substance and Sexual Activity  . Alcohol use: No  . Drug use: No  . Sexual activity: Not on file  Lifestyle  . Physical activity:    Days per week: Not on file    Minutes per session: Not on file  . Stress: Not on file  Relationships  . Social connections:    Talks on phone: Not on file  Gets together: Not on file    Attends religious service: Not on file    Active member of club or organization: Not on file    Attends meetings of clubs or organizations: Not on file    Relationship status: Not on file  . Intimate partner violence:    Fear of current or ex partner: Not on file    Emotionally abused: Not on file    Physically abused: Not on file    Forced sexual activity: Not on file  Other Topics Concern  . Not on file  Social History Narrative   Lives with husband in a one story home.  Has 2 children.  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  Education: college.     Family History  Problem Relation Age of Onset  . Breast cancer Cousin   . Colon  cancer Mother   . Prostate cancer Father   . Prostate cancer Brother   . Heart disease Daughter     Allergies  Allergen Reactions  . Lipitor [Atorvastatin] Other (See Comments)    Arthalgia  . Lipofen [Fenofibrate] Other (See Comments)    Arthalgia  . Mevacor [Lovastatin] Other (See Comments)    Arthalgia  . Zetia [Ezetimibe] Other (See Comments)    Arthalgia  . Zocor [Simvastatin] Other (See Comments)    Arthalgia     Review of Systems   Review of Systems: Negative Unless Checked Constitutional: [] Weight loss  [] Fever  [] Chills Cardiac: [] Chest pain   []  Atrial Fibrillation  [] Palpitations   [] Shortness of breath when laying flat   [] Shortness of breath with exertion. [] Shortness of breath at rest Vascular:  [x] Pain in legs with walking   [] Pain in legs with standing [] Pain in legs when laying flat   [] Claudication    [] Pain in feet when laying flat    [] History of DVT   [] Phlebitis   [] Swelling in legs   [] Varicose veins   [] Non-healing ulcers Pulmonary:   [] Uses home oxygen   [] Productive cough   [] Hemoptysis   [] Wheeze  [] COPD   [] Asthma Neurologic:  [] Dizziness   [] Seizures  [] Blackouts [] History of stroke   [] History of TIA  [] Aphasia   [] Temporary Blindness   [] Weakness or numbness in arm   [] Weakness or numbness in leg Musculoskeletal:   [] Joint swelling   [x] Joint pain   [] Low back pain  []  History of Knee Replacement [x] Arthritis [] back Surgeries  []  Spinal Stenosis    Hematologic:  [] Easy bruising  [] Easy bleeding   [] Hypercoagulable state   [] Anemic Gastrointestinal:  [] Diarrhea   [] Vomiting  [] Gastroesophageal reflux/heartburn   [] Difficulty swallowing. [] Abdominal pain Genitourinary:  [] Chronic kidney disease   [] Difficult urination  [] Anuric   [] Blood in urine [] Frequent urination  [] Burning with urination   [] Hematuria Skin:  [] Rashes   [] Ulcers [] Wounds Psychological:  [] History of anxiety   []  History of major depression  []  Memory Difficulties      Objective:    Physical Exam  BP (!) 177/79 (BP Location: Right Arm, Patient Position: Sitting)   Pulse 66   Resp 14   Ht 5\' 5"  (1.651 m)   Wt 163 lb 12.8 oz (74.3 kg)   BMI 27.26 kg/m   Gen: WD/WN, NAD Head: [place]/AT, No temporalis wasting.  Ear/Nose/Throat: Hearing grossly intact, nares w/o erythema or drainage Eyes: PER, EOMI, sclera nonicteric.  Neck: Supple, no masses.  No JVD.  Pulmonary:  Good air movement,  no use of accessory muscles.  Cardiac: RRR Vascular:  Vessel Right Left  Radial Palpable Palpable  Dorsalis Pedis Palpable Palpable  Posterior Tibial Palpable Palpable   Gastrointestinal: soft, non-distended. No guarding/no peritoneal signs.  Musculoskeletal: M/S 5/5 throughout.  No deformity or atrophy.  Neurologic: Pain and light touch intact in extremities.  Symmetrical.  Speech is fluent. Motor exam as listed above. Psychiatric: Judgment intact, Mood & affect appropriate for pt's clinical situation. Dermatologic: No Venous rashes. No Ulcers Noted.  No changes consistent with cellulitis. Lymph : No Cervical lymphadenopathy, no lichenification or skin changes of chronic lymphedema.      Assessment & Plan:    1. PAD (peripheral artery disease) (HCC) Recommend:  The patient is status post successful angiogram with intervention.  The patient reports that the claudication symptoms and leg pain is essentially gone.   The patient denies lifestyle limiting changes at this point in time.  No further invasive studies, angiography or surgery at this time The patient should continue walking and begin a more formal exercise program.  The patient should continue antiplatelet therapy and aggressive treatment of the lipid abnormalities  Smoking cessation was again discussed  The patient should continue wearing graduated compression socks 10-15 mmHg strength to control the mild edema.  Patient should undergo noninvasive studies in 3 months.  The patient will follow up with me after the  studies.   - VAS [place] ABI WITH/WO TBI; Future  2. Type 2 diabetes mellitus without complication, without long-term current use of insulin (HCC) Continue hypoglycemic medications as already ordered, these medications have been reviewed and there are no changes at this time.  Hgb A1C to be monitored as already arranged by primary service   3. Primary osteoarthritis of left knee Continue NSAID medications as already ordered, these medications have been reviewed and there are no changes at this time.  Continued activity and therapy was stressed.     Current Outpatient Medications on File Prior to Visit  Medication Sig Dispense Refill  . amLODipine (NORVASC) 5 MG tablet Take 1 tablet by mouth daily.    Marland Kitchen aspirin EC 81 MG tablet Take 1 tablet by mouth 1 day or 1 dose.    Marland Kitchen atenolol (TENORMIN) 50 MG tablet Take 1 tablet (50 mg total) by mouth once daily.    . Cholecalciferol (VITAMIN D) 2000 units CAPS Take 1 capsule by mouth daily.    . clobetasol cream (TEMOVATE) 7.93 % Apply 1 application topically 2 (two) times daily as needed.     . docusate sodium (COLACE) 100 MG capsule Take 1 capsule (100 mg total) by mouth 2 (two) times daily. 10 capsule 0  . fluticasone (FLONASE) 50 MCG/ACT nasal spray Place 1 spray into both nostrils daily as needed for allergies.     Marland Kitchen lisinopril (PRINIVIL,ZESTRIL) 20 MG tablet Take 1 tablet (20 mg total) by mouth once daily.    . meloxicam (MOBIC) 15 MG tablet Take 1 tablet by mouth as needed.     . Omega-3 Fatty Acids (FISH OIL PO) Take 1 capsule by mouth 1 day or 1 dose.     No current facility-administered medications on file prior to visit.     There are no Patient Instructions on file for this visit. No follow-ups on file.   Kris Hartmann, NP  This note was completed with Sales executive.  Any errors are purely unintentional.

## 2018-06-29 ENCOUNTER — Ambulatory Visit: Payer: Medicare Other | Admitting: Surgery

## 2018-07-14 ENCOUNTER — Ambulatory Visit
Admission: RE | Admit: 2018-07-14 | Discharge: 2018-07-14 | Disposition: A | Discharge status: Home / Self Care | Payer: Medicare Other | Source: Ambulatory Visit | Attending: Surgery | Admitting: Surgery

## 2018-07-14 ENCOUNTER — Other Ambulatory Visit: Payer: Self-pay | Admitting: Surgery

## 2018-07-14 DIAGNOSIS — Z1231 Encounter for screening mammogram for malignant neoplasm of breast: Secondary | ICD-10-CM

## 2018-07-28 IMAGING — MR MR BREAST BIOPSY
5 of 7 series · 32 of 48 positions shown · IV contrast (15ml Multihance)
Comparison: Previous exams.

ADDENDUM:
Pathology revealed high grade ductal carcinoma in situ in the
anterior left breast and high grade ductal carcinoma in situ in the
posterior left breast. This was found to be concordant by Dr. Lienad
Giorgi. Pathology results were discussed with the patient by
telephone. The patient reported doing well after the biopsies. Post
biopsy instructions and care were reviewed and questions were
answered. The patient was encouraged to call The [REDACTED] of
surgical consultation in [HOSPITAL]. Her medical provider, Tambasco
Foter NP, has been notified and will arrange for this surgical
consultation.

Pathology results reported by Sailaja Osterhout RN, BSN on 07/09/2016.
CLINICAL DATA: Patient presents for MRI guided biopsy of an area of
non mass enhancement, which extends from anterior to posterior along
the lower outer aspect of the left breast. Patient has a history of
chronic left nipple retraction and discharge. Two areas were
sampled, 1 anterior, and the other posterior.
EXAM:
MRI GUIDED CORE NEEDLE BIOPSY OF THE LEFT BREAST
TECHNIQUE: Multiplanar, multisequence MR imaging of the left breast was
performed both before and after administration of intravenous
contrast.
CONTRAST:  15mL MULTIHANCE GADOBENATE DIMEGLUMINE 529 MG/ML IV SOLN

[Series 3: axial pre-cm · axial · non-contrast · 1.3mm · 0.73mm/px · z∈[-122,+64]mm · 6 of 144 slices shown]
[im 1/144]
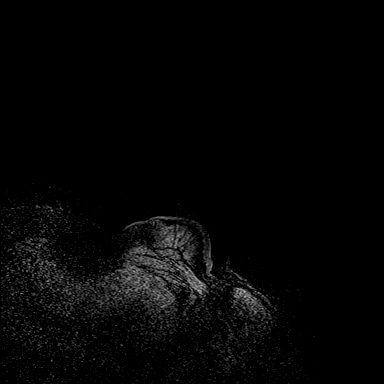
[im 29/144]
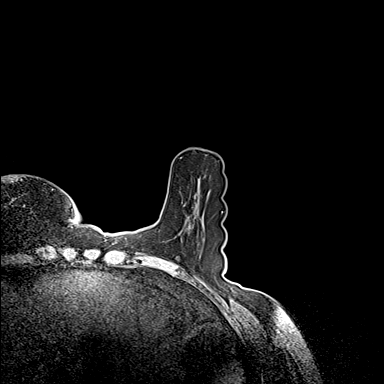
[im 58/144]
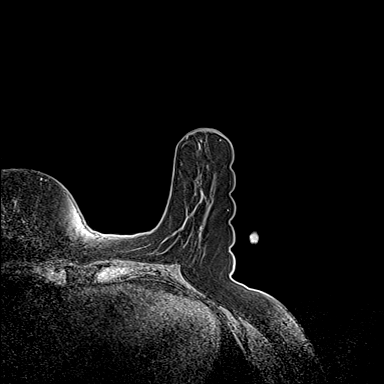
[im 86/144]
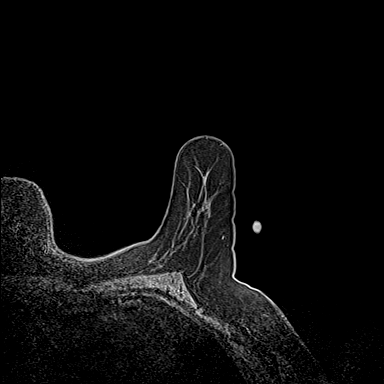
[im 115/144]
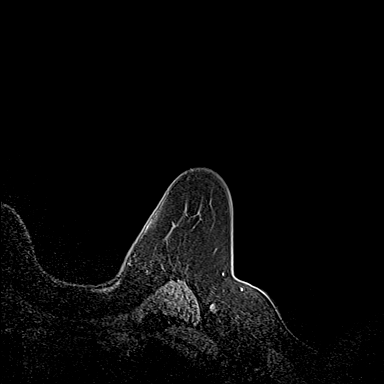
[im 144/144]
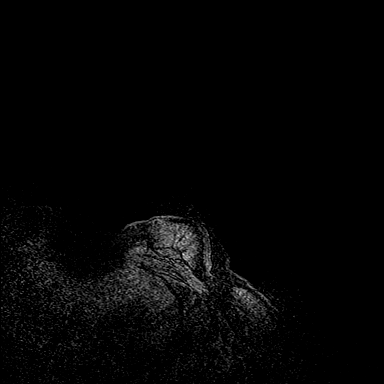

[Series 4: axial pre-cm_s3_(id) · axial · non-contrast · 1.3mm · 0.73mm/px · z∈[-122,+64]mm · 7 of 144 slices shown]
[im 1/144]
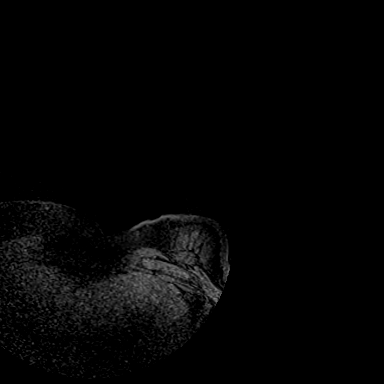
[im 24/144]
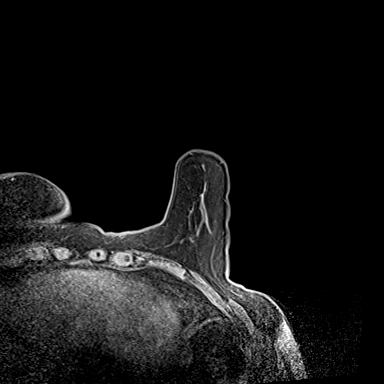
[im 48/144]
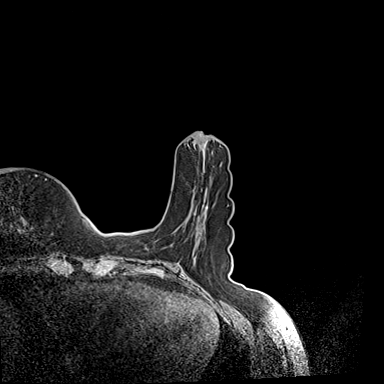
[im 72/144]
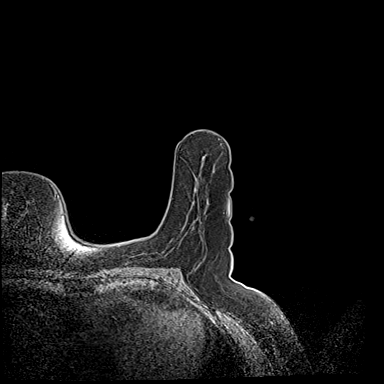
[im 96/144]
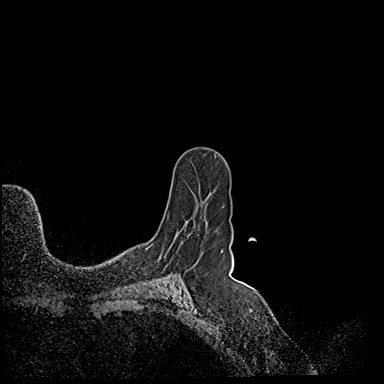
[im 120/144]
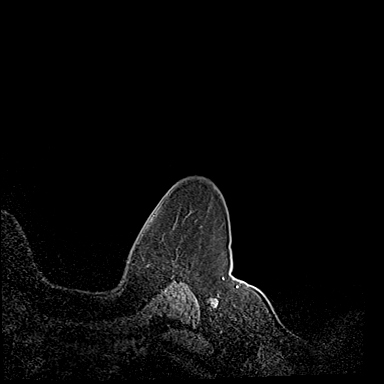
[im 144/144]
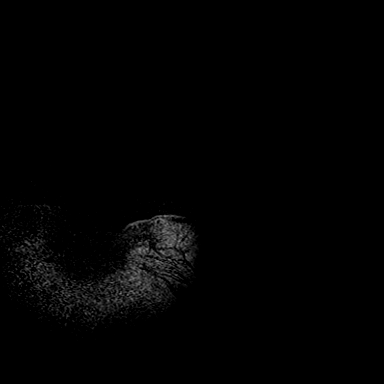

[Series 5: axial post 20 · axial · 1.3mm · 0.73mm/px · z∈[-122,+64]mm · 7 of 144 slices shown (1 of 3)]
[im 1/144]
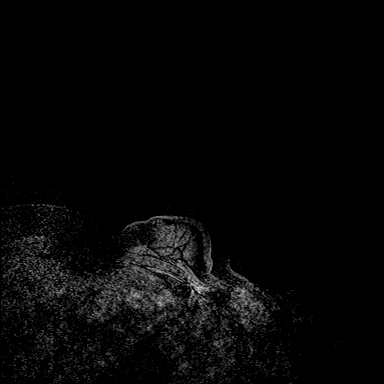
[im 24/144]
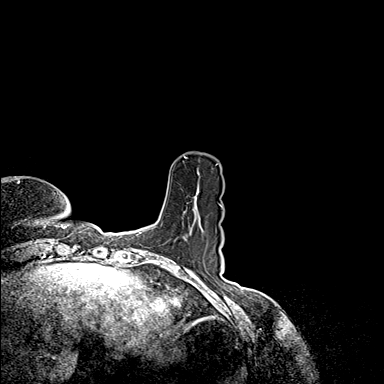
[im 48/144]
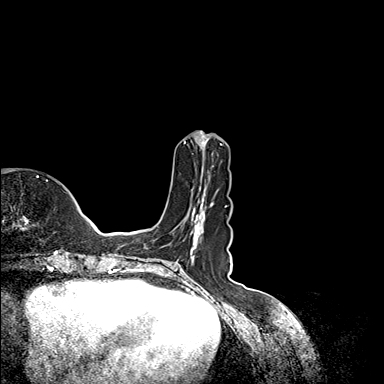
[im 72/144]
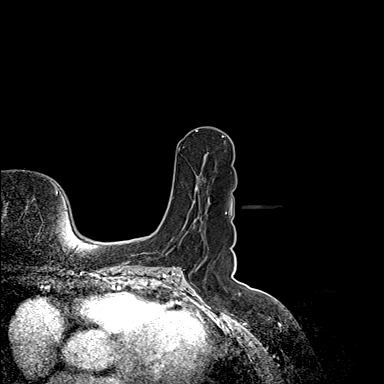
[im 96/144]
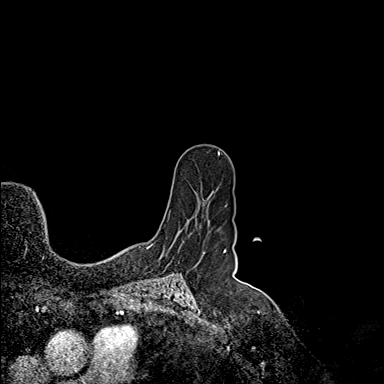
[im 120/144]
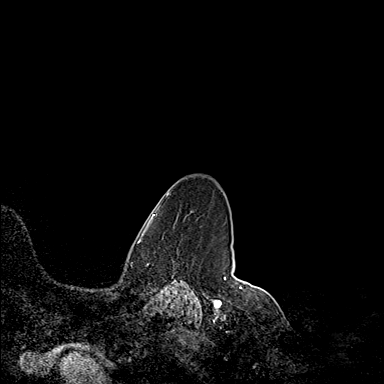
[im 144/144]
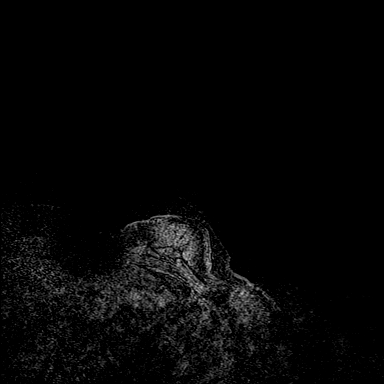

[Series 6: axial post 20 · axial · 1.3mm · 0.73mm/px · z∈[-122,+64]mm · 7 of 144 slices shown (2 of 3)]
[im 1/144]
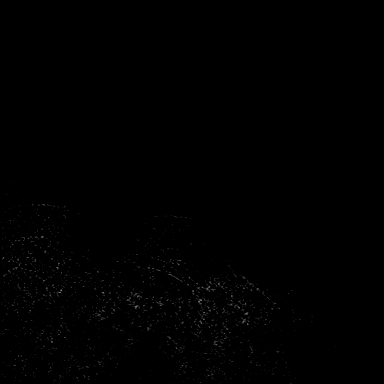
[im 24/144]
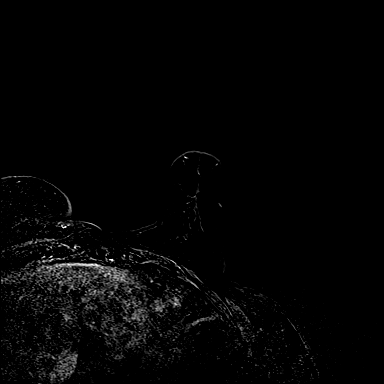
[im 48/144]
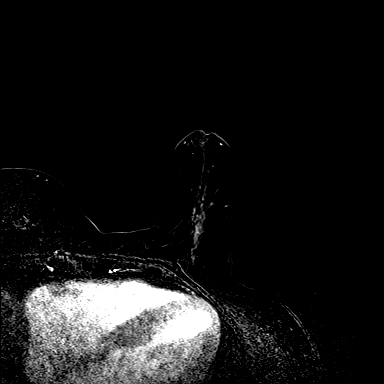
[im 72/144]
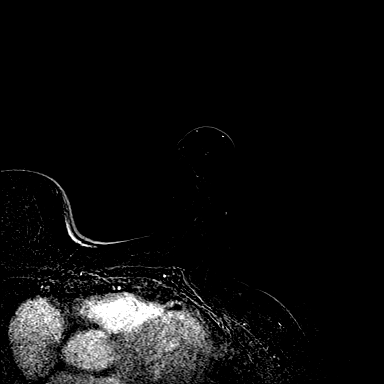
[im 96/144]
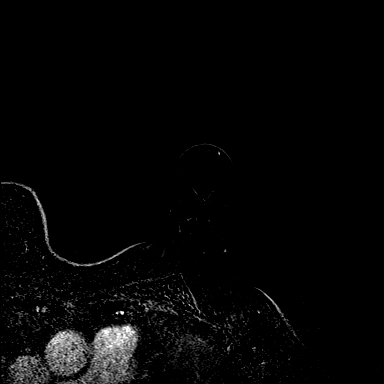
[im 120/144]
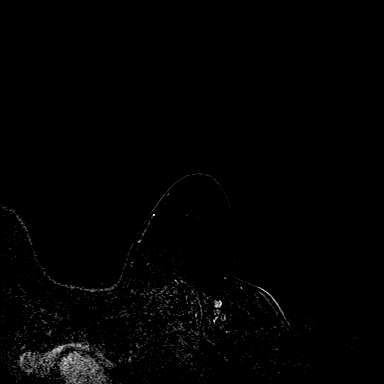
[im 144/144]
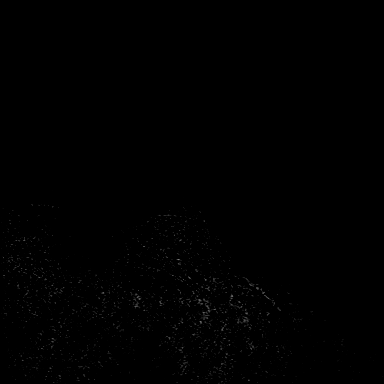

[Series 7: axial post 20 · axial · 1.3mm · 0.73mm/px · z∈[-122,+2]mm · 5 of 144 slices shown (3 of 3)]
[im 1/144]
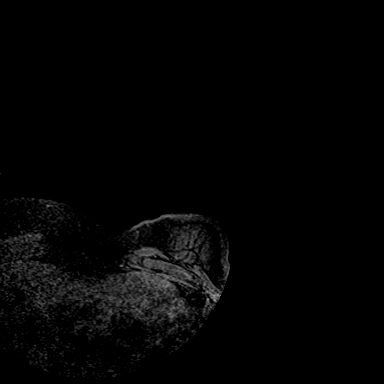
[im 24/144]
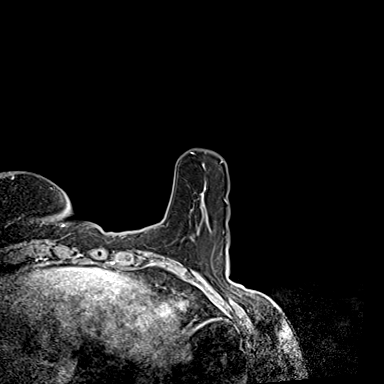
[im 48/144]
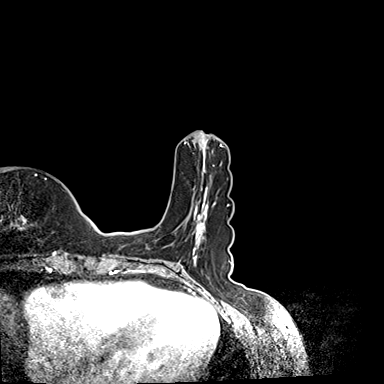
[im 72/144]
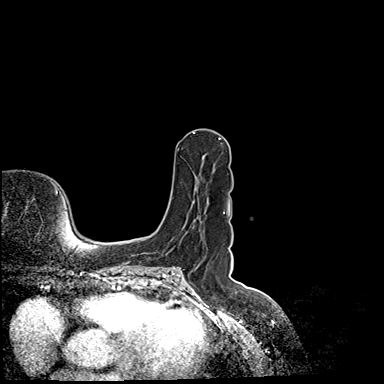
[im 96/144]
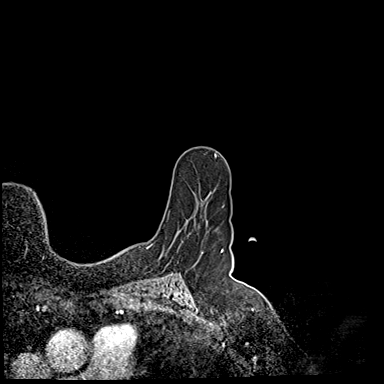

[32 of 48 positions shown; findings below may reference images not displayed]

FINDINGS: I met with the patient, and we discussed the procedure of MRI guided
biopsy, including risks, benefits, and alternatives. Specifically,
we discussed the risks of infection, bleeding, tissue injury, clip
migration, and inadequate sampling. Informed, written consent was
given. The usual time out protocol was performed immediately prior
to the procedure.

Using sterile technique, 1% Lidocaine, MRI guidance, and a 9 gauge
vacuum assisted device, biopsy was performed of the anterior aspect
of the non mass enhancement using a lateral approach. At the
conclusion of the procedure, a dumbbell shaped tissue marker clip
was deployed into the biopsy cavity.

Using sterile technique, 1% Lidocaine, MRI guidance, and a 9 gauge
vacuum assisted device, biopsy was performed of more posterior
aspect of the non mass enhancement using a lateral approach. At the
conclusion of the procedure, a cylinder shaped tissue marker clip
was deployed into the biopsy cavity.

Follow-up 2-view mammogram was performed and dictated separately.
IMPRESSION: MRI guided biopsy of 2 areas of non mass enhancement, 1 along the
anterior extent in the other along the posterior extent, of the left
breast. No apparent complications.

## 2018-08-01 ENCOUNTER — Ambulatory Visit: Payer: Medicare Other | Admitting: Surgery

## 2018-08-01 ENCOUNTER — Encounter: Payer: Self-pay | Admitting: Surgery

## 2018-08-01 ENCOUNTER — Telehealth: Payer: Self-pay | Admitting: *Deleted

## 2018-08-01 ENCOUNTER — Other Ambulatory Visit: Payer: Self-pay

## 2018-08-01 VITALS — BP 172/74 | HR 89 | Temp 97.2°F | Resp 14 | Ht 64.0 in | Wt 169.4 lb

## 2018-08-01 DIAGNOSIS — D0512 Intraductal carcinoma in situ of left breast: Secondary | ICD-10-CM | POA: Diagnosis not present

## 2018-08-01 NOTE — Telephone Encounter (Signed)
No answer on home number and not able to leave a message.  Patient has been scheduled for an ultrasound of the left chest wall mass/mastectomy site.   This has been scheduled for 08-09-18 at 9:20 am at the [facility]. We just need to inform her of appointment date and time.

## 2018-08-01 NOTE — Patient Instructions (Addendum)
We will schedule a left chest wall ultrasound for you. We will call with your results.    The patient has been asked to return to the office in one year with a unilateral right breast screening mammogram.

## 2018-08-02 ENCOUNTER — Encounter: Payer: Self-pay | Admitting: Surgery

## 2018-08-02 NOTE — Telephone Encounter (Signed)
Spoke with the patient and notified of Ultrasound appointment date and time.

## 2018-08-02 NOTE — Progress Notes (Signed)
Outpatient Surgical Follow Up  08/02/2018  Lynn Wood is an 73 y.o. female.   No chief complaint on file.   HPI: s/p mastectomy 3/ 2018. Doing well. Recent edema on left chest wall. Some discomfort. Now symptoms resolved.NO fevers no chill.s MAmmo per. reviewed No evidence of any new susp. Masses. She did have angioplasty w balloon a few months ago.   Past Medical History:  Diagnosis Date  . Benign neoplasm of colon   . Cancer ([place]) 06/2016   left breast  . Carpal tunnel syndrome   . DCIS (ductal carcinoma in situ) of breast   . Diabetes mellitus type 2, uncomplicated ([place])   . Hypertension   . Nontoxic uninodular goiter   . Pre-diabetes   . Pure hypercholesterolemia   . Vaginal vault prolapse   . Vitamin D deficiency     Past Surgical History:  Procedure Laterality Date  . COLONOSCOPY WITH PROPOFOL N/A 01/22/2015   Procedure: COLONOSCOPY WITH PROPOFOL;  Surgeon: Lollie Sails, MD;  Location: [facility] ENDOSCOPY;  Service: Endoscopy;  Laterality: N/A;  . GYNECOLOGIC CRYOSURGERY    . INCISION AND DRAINAGE ABSCESS Left 07/29/2016   Procedure: Reexploration of left breast mastectomy site;  Surgeon: Jules Husbands, MD;  Location: [facility] ORS;  Service: General;  Laterality: Left;  . LOWER EXTREMITY ANGIOGRAPHY Left 05/19/2018   Procedure: LOWER EXTREMITY ANGIOGRAPHY;  Surgeon: Algernon Huxley, MD;  Location: [place] CV LAB;  Service: Cardiovascular;  Laterality: Left;  Marland Kitchen MASTECTOMY Left    March 2018  . MASTECTOMY W/ SENTINEL NODE BIOPSY Left 07/28/2016   Procedure: MASTECTOMY WITH SENTINEL LYMPH NODE BIOPSY;  Surgeon: Jules Husbands, MD;  Location: [facility] ORS;  Service: General;  Laterality: Left;  Marland Kitchen MULTIPLE TOOTH EXTRACTIONS  1997    Family History  Problem Relation Age of Onset  . Breast cancer Cousin   . Colon cancer Mother   . Prostate cancer Father   . Prostate cancer Brother   . Heart disease Daughter     Social History:  reports that she has been smoking  cigarettes. She has been smoking about 0.00 packs per day. She has never used smokeless tobacco. She reports that she does not drink alcohol or use drugs.  Allergies:  Allergies  Allergen Reactions  . Lipitor [Atorvastatin] Other (See Comments)    Arthalgia  . Lipofen [Fenofibrate] Other (See Comments)    Arthalgia  . Mevacor [Lovastatin] Other (See Comments)    Arthalgia  . Zetia [Ezetimibe] Other (See Comments)    Arthalgia  . Zocor [Simvastatin] Other (See Comments)    Arthalgia    Medications reviewed.    ROS Full ROS performed and is otherwise negative other than what is stated in HPI   BP (!) 172/74   Pulse 89   Temp (!) 97.2 F (36.2 C)   Resp 14   Ht 5\' 4"  (1.626 m)   Wt 169 lb 6.4 oz (76.8 kg)   SpO2 100%   BMI 29.08 kg/m   Physical Exam NAD, alert Breast: mastectomy scar healed, no infection seroma. No evidence of lymphedema  Abd: soft, nt, no masses Neuro: GCS 15, no motor or sens deficits   Assessment/Plan:  1. Ductal carcinoma in situ (DCIS) of left breast S/p mastectomy, some chest wall discomofrt. Pt is concerned. D/W her about options of observation vs U/S. She would feel better if we obtain U/S. I will see her back in a few weeks w results - US BREAST LTD  UNI LEFT INC AXILLA; Future    Greater than 50% of the 15 minutes  visit was spent in counseling/coordination of care   Caroleen Hamman, MD [facility] Surgeon

## 2018-08-03 ENCOUNTER — Encounter: Payer: Self-pay | Admitting: *Deleted

## 2018-08-03 NOTE — Progress Notes (Signed)
Per superbill, Dr. Dahlia Byes wanted patient to have left chest wall ultrasound and follow up with the office PRN.   Per Dr. Corlis Leak office note from 08-01-18, he wanted to see patient back after ultrasound for follow up.   A secure chat message was sent to Dr. Dahlia Byes to see which he would like. Dr. Dahlia Byes states that patient did not want to come back in the office and have to pay another co-pay so patient will just be contacted with the results of the ultrasound once available.

## 2018-08-09 ENCOUNTER — Other Ambulatory Visit: Payer: Medicare Other

## 2018-08-09 ENCOUNTER — Ambulatory Visit
Admission: RE | Admit: 2018-08-09 | Discharge: 2018-08-09 | Disposition: A | Discharge status: Home / Self Care | Payer: Medicare Other | Source: Ambulatory Visit | Attending: Surgery | Admitting: Surgery

## 2018-08-09 DIAGNOSIS — D0512 Intraductal carcinoma in situ of left breast: Secondary | ICD-10-CM | POA: Insufficient documentation

## 2018-08-10 ENCOUNTER — Telehealth: Payer: Self-pay

## 2018-08-10 NOTE — Telephone Encounter (Signed)
Patient notified with normal Ultrasound results per Dr.Pabon.

## 2018-09-27 ENCOUNTER — Ambulatory Visit (INDEPENDENT_AMBULATORY_CARE_PROVIDER_SITE_OTHER): Payer: Medicare Other | Admitting: Vascular Surgery

## 2018-09-27 ENCOUNTER — Other Ambulatory Visit: Payer: Self-pay

## 2018-09-27 ENCOUNTER — Encounter (INDEPENDENT_AMBULATORY_CARE_PROVIDER_SITE_OTHER): Payer: Self-pay | Admitting: Vascular Surgery

## 2018-09-27 ENCOUNTER — Ambulatory Visit (INDEPENDENT_AMBULATORY_CARE_PROVIDER_SITE_OTHER): Payer: Medicare Other

## 2018-09-27 VITALS — BP 187/72 | HR 53 | Resp 16 | Ht 65.0 in | Wt 169.0 lb

## 2018-09-27 DIAGNOSIS — I739 Peripheral vascular disease, unspecified: Secondary | ICD-10-CM | POA: Diagnosis not present

## 2018-09-27 DIAGNOSIS — I10 Essential (primary) hypertension: Secondary | ICD-10-CM

## 2018-09-27 DIAGNOSIS — Z79899 Other long term (current) drug therapy: Secondary | ICD-10-CM

## 2018-09-27 DIAGNOSIS — E119 Type 2 diabetes mellitus without complications: Secondary | ICD-10-CM

## 2018-09-27 NOTE — Patient Instructions (Signed)
Peripheral Vascular Disease  Peripheral vascular disease (PVD) is a disease of the blood vessels that are not part of your heart and brain. A simple term for PVD is poor circulation. In most cases, PVD narrows the blood vessels that carry blood from your heart to the rest of your body. This can reduce the supply of blood to your arms, legs, and internal organs, like your stomach or kidneys. However, PVD most often affects a person's lower legs and feet. Without treatment, PVD tends to get worse. PVD can also lead to acute ischemic limb. This is when an arm or leg suddenly cannot get enough blood. This is a medical emergency. Follow these instructions at home: Lifestyle  Do not use any products that contain nicotine or tobacco, such as cigarettes and e-cigarettes. If you need help quitting, ask your doctor.  Lose weight if you are overweight. Or, stay at a healthy weight as told by your doctor.  Eat a diet that is low in fat and cholesterol. If you need help, ask your doctor.  Exercise regularly. Ask your doctor for activities that are right for you. General instructions  Take over-the-counter and prescription medicines only as told by your doctor.  Take good care of your feet: ? Wear comfortable shoes that fit well. ? Check your feet often for any cuts or sores.  Keep all follow-up visits as told by your doctor This is important. Contact a doctor if:  You have cramps in your legs when you walk.  You have leg pain when you are at rest.  You have coldness in a leg or foot.  Your skin changes.  You are unable to get or have an erection (erectile dysfunction).  You have cuts or sores on your feet that do not heal. Get help right away if:  Your arm or leg turns cold, numb, and blue.  Your arms or legs become red, warm, swollen, painful, or numb.  You have chest pain.  You have trouble breathing.  You suddenly have weakness in your face, arm, or leg.  You become very  confused or you cannot speak.  You suddenly have a very bad headache.  You suddenly cannot see. Summary  Peripheral vascular disease (PVD) is a disease of the blood vessels.  A simple term for PVD is poor circulation. Without treatment, PVD tends to get worse.  Treatment may include exercise, low fat and low cholesterol diet, and quitting smoking. This information is not intended to replace advice given to you by your health care provider. Make sure you discuss any questions you have with your health care provider. Document Released: 08/26/2009 Document Revised: 07/09/2016 Document Reviewed: 07/09/2016 Elsevier Interactive Patient Education  2019 [employer]  

## 2018-09-27 NOTE — Progress Notes (Signed)
MRN : [id number]  Lynn Wood is a 73 y.o. ([birth year]) female who presents with chief complaint of  Chief Complaint  Patient presents with  . Follow-up    72month abi  .  History of Present Illness: Patient returns today in follow up of her PAD.  Overall her legs are doing fairly well.  Her biggest issues now are from arthritic problems and not claudication itself.  She has some rash on her lower leg that is being treated with clobetasol and I have given her another prescription for that today.  No rest pain.  No signs of infection.  Her ABIs today are 0.87 on the right and 0.88 on the left with strong biphasic waveforms and normal digital pressures bilaterally.  Current Outpatient Medications  Medication Sig Dispense Refill  . amLODipine (NORVASC) 5 MG tablet Take 5 mg by mouth daily.    Marland Kitchen aspirin EC 81 MG tablet Take 1 tablet by mouth 1 day or 1 dose.    Marland Kitchen atenolol (TENORMIN) 50 MG tablet Take 1 tablet (50 mg total) by mouth once daily.    . Cholecalciferol (VITAMIN D) 2000 units CAPS Take 1 capsule by mouth daily.    . clobetasol cream (TEMOVATE) 9.32 % Apply 1 application topically 2 (two) times daily as needed.     . docusate sodium (COLACE) 100 MG capsule Take 1 capsule (100 mg total) by mouth 2 (two) times daily. 10 capsule 0  . fluticasone (FLONASE) 50 MCG/ACT nasal spray Place 1 spray into both nostrils daily as needed for allergies.     Marland Kitchen lisinopril (PRINIVIL,ZESTRIL) 20 MG tablet Take 1 tablet (20 mg total) by mouth once daily.    . meloxicam (MOBIC) 15 MG tablet Take 1 tablet by mouth as needed.     . Omega-3 Fatty Acids (FISH OIL PO) Take 1 capsule by mouth 1 day or 1 dose.     No current facility-administered medications for this visit.     Past Medical History:  Diagnosis Date  . Benign neoplasm of colon   . Cancer ([place]) 06/2016   left breast  . Carpal tunnel syndrome   . DCIS (ductal carcinoma in situ) of breast   . Diabetes mellitus type 2,  uncomplicated ([place])   . Hypertension   . Nontoxic uninodular goiter   . Pre-diabetes   . Pure hypercholesterolemia   . Vaginal vault prolapse   . Vitamin D deficiency     Past Surgical History:  Procedure Laterality Date  . COLONOSCOPY WITH PROPOFOL N/A 01/22/2015   Procedure: COLONOSCOPY WITH PROPOFOL;  Surgeon: Lollie Sails, MD;  Location: [facility] ENDOSCOPY;  Service: Endoscopy;  Laterality: N/A;  . GYNECOLOGIC CRYOSURGERY    . INCISION AND DRAINAGE ABSCESS Left 07/29/2016   Procedure: Reexploration of left breast mastectomy site;  Surgeon: Jules Husbands, MD;  Location: [facility] ORS;  Service: General;  Laterality: Left;  . LOWER EXTREMITY ANGIOGRAPHY Left 05/19/2018   Procedure: LOWER EXTREMITY ANGIOGRAPHY;  Surgeon: Algernon Huxley, MD;  Location: [place] CV LAB;  Service: Cardiovascular;  Laterality: Left;  Marland Kitchen MASTECTOMY Left    March 2018  . MASTECTOMY W/ SENTINEL NODE BIOPSY Left 07/28/2016   Procedure: MASTECTOMY WITH SENTINEL LYMPH NODE BIOPSY;  Surgeon: Jules Husbands, MD;  Location: [facility] ORS;  Service: General;  Laterality: Left;  Marland Kitchen MULTIPLE TOOTH EXTRACTIONS  1997    Social History Social History   Tobacco Use  . Smoking status: Light Tobacco Smoker  Packs/day: 0.00    Types: Cigarettes  . Smokeless tobacco: Never Used  . Tobacco comment: 1 cigarette every 1-2 weeks  Substance Use Topics  . Alcohol use: No  . Drug use: No     Family History Family History  Problem Relation Age of Onset  . Breast cancer Cousin   . Colon cancer Mother   . Prostate cancer Father   . Prostate cancer Brother   . Heart disease Daughter     Allergies  Allergen Reactions  . Lipitor [Atorvastatin] Other (See Comments)    Arthalgia  . Lipofen [Fenofibrate] Other (See Comments)    Arthalgia  . Mevacor [Lovastatin] Other (See Comments)    Arthalgia  . Zetia [Ezetimibe] Other (See Comments)    Arthalgia  . Zocor [Simvastatin] Other (See Comments)    Arthalgia     REVIEW OF  SYSTEMS (Negative unless checked)  Constitutional: [] Weight loss  [] Fever  [] Chills Cardiac: [] Chest pain   [] Chest pressure   [] Palpitations   [] Shortness of breath when laying flat   [] Shortness of breath at rest   [] Shortness of breath with exertion. Vascular:  [x] Pain in legs with walking   [] Pain in legs at rest   [] Pain in legs when laying flat   [x] Claudication   [] Pain in feet when walking  [] Pain in feet at rest  [] Pain in feet when laying flat   [] History of DVT   [] Phlebitis   [] Swelling in legs   [] Varicose veins   [] Non-healing ulcers Pulmonary:   [] Uses home oxygen   [] Productive cough   [] Hemoptysis   [] Wheeze  [] COPD   [] Asthma Neurologic:  [] Dizziness  [] Blackouts   [] Seizures   [] History of stroke   [] History of TIA  [] Aphasia   [] Temporary blindness   [] Dysphagia   [] Weakness or numbness in arms   [] Weakness or numbness in legs Musculoskeletal:  [x] Arthritis   [x] Joint swelling   [x] Joint pain   [] Low back pain Hematologic:  [] Easy bruising  [] Easy bleeding   [] Hypercoagulable state   [] Anemic   Gastrointestinal:  [] Blood in stool   [] Vomiting blood  [x] Gastroesophageal reflux/heartburn   [] Abdominal pain Genitourinary:  [] Chronic kidney disease   [] Difficult urination  [] Frequent urination  [] Burning with urination   [] Hematuria Skin:  [] Rashes   [] Ulcers   [] Wounds Psychological:  [] History of anxiety   []  History of major depression.  Physical Examination  BP (!) 187/72 (BP Location: Right Arm)   Pulse (!) 53   Resp 16   Ht 5\' 5"  (1.651 m)   Wt 169 lb (76.7 kg)   BMI 28.12 kg/m  Gen:  WD/WN, NAD Head: [place]/AT, No temporalis wasting. Ear/Nose/Throat: Hearing grossly intact, nares w/o erythema or drainage Eyes: Conjunctiva clear. Sclera non-icteric Neck: Supple.  Trachea midline Pulmonary:  Good air movement, no use of accessory muscles.  Cardiac: RRR, no JVD Vascular:  Vessel Right Left  Radial Palpable Palpable                          PT Palpable Palpable   DP Palpable Palpable    Musculoskeletal: M/S 5/5 throughout.  No deformity or atrophy.  No significant lower extremity edema. Neurologic: Sensation grossly intact in extremities.  Symmetrical.  Speech is fluent.  Psychiatric: Judgment intact, Mood & affect appropriate for pt's clinical situation. Dermatologic: No rashes or ulcers noted.  No cellulitis or open wounds.       Labs No results found for this  or any previous visit (from the past 2160 hour(s)).  Radiology No results found.  Assessment/Plan Benign essential hypertension blood pressure control important in reducing the progression of atherosclerotic disease. On appropriate oral medications.   Diabetes mellitus type 2, uncomplicated (HCC) blood glucose control important in reducing the progression of atherosclerotic disease. Also, involved in wound healing. On appropriate medications.  PAD (peripheral artery disease) (HCC) Her ABIs today are 0.87 on the right and 0.88 on the left with strong biphasic waveforms and normal digital pressures bilaterally. Overall, her perfusion is doing fairly well.  Plan to recheck in about 6 months with noninvasive studies or sooner if problems develop in the interim.  Continue current medical regimen.    Leotis Pain, MD  09/27/2018 3:08 PM    This note was created with Dragon medical transcription system.  Any errors from dictation are purely unintentional

## 2018-09-27 NOTE — Assessment & Plan Note (Signed)
Her ABIs today are 0.87 on the right and 0.88 on the left with strong biphasic waveforms and normal digital pressures bilaterally. Overall, her perfusion is doing fairly well.  Plan to recheck in about 6 months with noninvasive studies or sooner if problems develop in the interim.  Continue current medical regimen.

## 2019-04-04 ENCOUNTER — Ambulatory Visit (INDEPENDENT_AMBULATORY_CARE_PROVIDER_SITE_OTHER): Payer: Medicare Other | Admitting: Vascular Surgery

## 2019-04-04 ENCOUNTER — Encounter (INDEPENDENT_AMBULATORY_CARE_PROVIDER_SITE_OTHER): Payer: Self-pay | Admitting: Vascular Surgery

## 2019-04-04 ENCOUNTER — Other Ambulatory Visit: Payer: Self-pay

## 2019-04-04 ENCOUNTER — Encounter (INDEPENDENT_AMBULATORY_CARE_PROVIDER_SITE_OTHER): Payer: Self-pay

## 2019-04-04 ENCOUNTER — Ambulatory Visit (INDEPENDENT_AMBULATORY_CARE_PROVIDER_SITE_OTHER): Payer: Medicare Other

## 2019-04-04 VITALS — BP 186/84 | HR 67 | Resp 16 | Wt 169.0 lb

## 2019-04-04 DIAGNOSIS — M79601 Pain in right arm: Secondary | ICD-10-CM

## 2019-04-04 DIAGNOSIS — I739 Peripheral vascular disease, unspecified: Secondary | ICD-10-CM

## 2019-04-04 DIAGNOSIS — E119 Type 2 diabetes mellitus without complications: Secondary | ICD-10-CM

## 2019-04-04 DIAGNOSIS — I10 Essential (primary) hypertension: Secondary | ICD-10-CM | POA: Diagnosis not present

## 2019-04-04 NOTE — Assessment & Plan Note (Signed)
It sounds like she is getting appropriate therapy with physical therapy for her right arm pain.  I am happy to perform any noninvasive studies that are felt appropriate going forward if her symptoms do not improve.  Vascular cause of her right upper extremity symptoms is reasonably unlikely.

## 2019-04-04 NOTE — Patient Instructions (Signed)
Peripheral Vascular Disease  Peripheral vascular disease (PVD) is a disease of the blood vessels that are not part of your heart and brain. A simple term for PVD is poor circulation. In most cases, PVD narrows the blood vessels that carry blood from your heart to the rest of your body. This can reduce the supply of blood to your arms, legs, and internal organs, like your stomach or kidneys. However, PVD most often affects a person's lower legs and feet. Without treatment, PVD tends to get worse. PVD can also lead to acute ischemic limb. This is when an arm or leg suddenly cannot get enough blood. This is a medical emergency. Follow these instructions at home: Lifestyle  Do not use any products that contain nicotine or tobacco, such as cigarettes and e-cigarettes. If you need help quitting, ask your doctor.  Lose weight if you are overweight. Or, stay at a healthy weight as told by your doctor.  Eat a diet that is low in fat and cholesterol. If you need help, ask your doctor.  Exercise regularly. Ask your doctor for activities that are right for you. General instructions  Take over-the-counter and prescription medicines only as told by your doctor.  Take good care of your feet: ? Wear comfortable shoes that fit well. ? Check your feet often for any cuts or sores.  Keep all follow-up visits as told by your doctor This is important. Contact a doctor if:  You have cramps in your legs when you walk.  You have leg pain when you are at rest.  You have coldness in a leg or foot.  Your skin changes.  You are unable to get or have an erection (erectile dysfunction).  You have cuts or sores on your feet that do not heal. Get help right away if:  Your arm or leg turns cold, numb, and blue.  Your arms or legs become red, warm, swollen, painful, or numb.  You have chest pain.  You have trouble breathing.  You suddenly have weakness in your face, arm, or leg.  You become very  confused or you cannot speak.  You suddenly have a very bad headache.  You suddenly cannot see. Summary  Peripheral vascular disease (PVD) is a disease of the blood vessels.  A simple term for PVD is poor circulation. Without treatment, PVD tends to get worse.  Treatment may include exercise, low fat and low cholesterol diet, and quitting smoking. This information is not intended to replace advice given to you by your health care provider. Make sure you discuss any questions you have with your health care provider. Document Released: 08/26/2009 Document Revised: 05/14/2017 Document Reviewed: 07/09/2016 Elsevier Patient Education  2020 Elsevier Inc.  

## 2019-04-04 NOTE — Progress Notes (Signed)
MRN : [id number]  Lynn Wood is a 73 y.o. ([birth year]) female who presents with chief complaint of  Chief Complaint  Patient presents with  . Follow-up    ultrasound follow up  .  History of Present Illness: Patient returns today in follow up of her PAD.  She is doing reasonably well today.  Currently, she is having some right arm and shoulder issues.  She is getting physical therapy for what sounds like thoracic outlet syndrome or a pinched nerve in her neck.  That seems to be helping somewhat, but her symptoms do persist.  As for her legs, they are doing fairly well.  She has a few areas of scab and puffiness on the left leg with some mild swelling, but no open ulceration or infection.  ABIs today are 0.98 on the right and 0.88 on the left with fairly brisk waveforms bilaterally and normal digital pressures bilaterally.  Current Outpatient Medications  Medication Sig Dispense Refill  . amLODipine (NORVASC) 5 MG tablet Take 5 mg by mouth daily.    Marland Kitchen aspirin EC 81 MG tablet Take 1 tablet by mouth 1 day or 1 dose.    Marland Kitchen atenolol (TENORMIN) 50 MG tablet Take 1 tablet (50 mg total) by mouth once daily.    . Cholecalciferol (VITAMIN D) 2000 units CAPS Take 1 capsule by mouth daily.    . clobetasol cream (TEMOVATE) [id number] % Apply 1 application topically 2 (two) times daily as needed.     . docusate sodium (COLACE) 100 MG capsule Take 1 capsule (100 mg total) by mouth 2 (two) times daily. 10 capsule 0  . fluticasone (FLONASE) 50 MCG/ACT nasal spray Place 1 spray into both nostrils daily as needed for allergies.     Marland Kitchen lisinopril (PRINIVIL,ZESTRIL) 20 MG tablet Take 1 tablet (20 mg total) by mouth once daily.    . meloxicam (MOBIC) 15 MG tablet Take 1 tablet by mouth as needed.     . Omega-3 Fatty Acids (FISH OIL PO) Take 1 capsule by mouth 1 day or 1 dose.     No current facility-administered medications for this visit.     Past Medical History:  Diagnosis Date  . Benign neoplasm of  colon   . Cancer ([place]) 06/2016   left breast  . Carpal tunnel syndrome   . DCIS (ductal carcinoma in situ) of breast   . Diabetes mellitus type 2, uncomplicated ([place])   . Hypertension   . Nontoxic uninodular goiter   . Pre-diabetes   . Pure hypercholesterolemia   . Vaginal vault prolapse   . Vitamin D deficiency     Past Surgical History:  Procedure Laterality Date  . COLONOSCOPY WITH PROPOFOL N/A 01/22/2015   Procedure: COLONOSCOPY WITH PROPOFOL;  Surgeon: Lollie Sails, MD;  Location: [facility] ENDOSCOPY;  Service: Endoscopy;  Laterality: N/A;  . GYNECOLOGIC CRYOSURGERY    . INCISION AND DRAINAGE ABSCESS Left 07/29/2016   Procedure: Reexploration of left breast mastectomy site;  Surgeon: Jules Husbands, MD;  Location: [facility] ORS;  Service: General;  Laterality: Left;  . LOWER EXTREMITY ANGIOGRAPHY Left 05/19/2018   Procedure: LOWER EXTREMITY ANGIOGRAPHY;  Surgeon: Algernon Huxley, MD;  Location: [place] CV LAB;  Service: Cardiovascular;  Laterality: Left;  Marland Kitchen MASTECTOMY Left    March 2018  . MASTECTOMY W/ SENTINEL NODE BIOPSY Left 07/28/2016   Procedure: MASTECTOMY WITH SENTINEL LYMPH NODE BIOPSY;  Surgeon: Jules Husbands, MD;  Location: [facility] ORS;  Service: General;  Laterality: Left;  Marland Kitchen MULTIPLE TOOTH EXTRACTIONS  1997   Social History        Tobacco Use  . Smoking status: Light Tobacco Smoker    Packs/day: 0.00    Types: Cigarettes  . Smokeless tobacco: Never Used  . Tobacco comment: 1 cigarette every 1-2 weeks  Substance Use Topics  . Alcohol use: No  . Drug use: No     Family History      Family History  Problem Relation Age of Onset  . Breast cancer Cousin   . Colon cancer Mother   . Prostate cancer Father   . Prostate cancer Brother   . Heart disease Daughter          Allergies  Allergen Reactions  . Lipitor [Atorvastatin] Other (See Comments)    Arthalgia  . Lipofen [Fenofibrate] Other (See Comments)    Arthalgia  . Mevacor  [Lovastatin] Other (See Comments)    Arthalgia  . Zetia [Ezetimibe] Other (See Comments)    Arthalgia  . Zocor [Simvastatin] Other (See Comments)    Arthalgia     REVIEW OF SYSTEMS (Negative unless checked)  Constitutional: [] ?Weight loss  [] ?Fever  [] ?Chills Cardiac: [] ?Chest pain   [] ?Chest pressure   [] ?Palpitations   [] ?Shortness of breath when laying flat   [] ?Shortness of breath at rest   [] ?Shortness of breath with exertion. Vascular:  [x] ?Pain in legs with walking   [] ?Pain in legs at rest   [] ?Pain in legs when laying flat   [x] ?Claudication   [] ?Pain in feet when walking  [] ?Pain in feet at rest  [] ?Pain in feet when laying flat   [] ?History of DVT   [] ?Phlebitis   [] ?Swelling in legs   [] ?Varicose veins   [] ?Non-healing ulcers Pulmonary:   [] ?Uses home oxygen   [] ?Productive cough   [] ?Hemoptysis   [] ?Wheeze  [] ?COPD   [] ?Asthma Neurologic:  [] ?Dizziness  [] ?Blackouts   [] ?Seizures   [] ?History of stroke   [] ?History of TIA  [] ?Aphasia   [] ?Temporary blindness   [] ?Dysphagia   [] ?Weakness or numbness in arms   [] ?Weakness or numbness in legs Musculoskeletal:  [x] ?Arthritis   [x] ?Joint swelling   [x] ?Joint pain   [] ?Low back pain Hematologic:  [] ?Easy bruising  [] ?Easy bleeding   [] ?Hypercoagulable state   [] ?Anemic   Gastrointestinal:  [] ?Blood in stool   [] ?Vomiting blood  [x] ?Gastroesophageal reflux/heartburn   [] ?Abdominal pain Genitourinary:  [] ?Chronic kidney disease   [] ?Difficult urination  [] ?Frequent urination  [] ?Burning with urination   [] ?Hematuria Skin:  [] ?Rashes   [] ?Ulcers   [] ?Wounds Psychological:  [] ?History of anxiety   [] ? History of major depression.     Physical Examination  BP (!) 186/84 (BP Location: Right Arm)   Pulse 67   Resp 16   Wt 169 lb (76.7 kg)   BMI 28.12 kg/m  Gen:  WD/WN, NAD Head: [place]/AT, No temporalis wasting. Ear/Nose/Throat: Hearing grossly intact, nares w/o erythema or drainage Eyes: Conjunctiva clear. Sclera  non-icteric Neck: Supple.  Trachea midline Pulmonary:  Good air movement, no use of accessory muscles.  Cardiac: RRR, no JVD Vascular:  Vessel Right Left  Radial Palpable Palpable                          PT Palpable  1+ palpable  DP Palpable Palpable   Gastrointestinal: soft, non-tender/non-distended. No guarding/reflex.  Musculoskeletal: M/S 5/5 throughout.  No deformity or atrophy.  1+ left lower extremity edema. Neurologic: Sensation grossly intact  in extremities.  Symmetrical.  Speech is fluent.  Psychiatric: Judgment intact, Mood & affect appropriate for pt's clinical situation. Dermatologic: No rashes or ulcers noted.  No cellulitis or open wounds.       Labs No results found for this or any previous visit (from the past 2160 hour(s)).  Radiology No results found.  Assessment/Plan Benign essential hypertension blood pressure control important in reducing the progression of atherosclerotic disease. On appropriate oral medications.   Diabetes mellitus type 2, uncomplicated (HCC) blood glucose control important in reducing the progression of atherosclerotic disease. Also, involved in wound healing. On appropriate medications.  Pain in limb It sounds like she is getting appropriate therapy with physical therapy for her right arm pain.  I am happy to perform any noninvasive studies that are felt appropriate going forward if her symptoms do not improve.  Vascular cause of her right upper extremity symptoms is reasonably unlikely.  PAD (peripheral artery disease) (HCC) ABIs today are 0.98 on the right and 0.88 on the left with fairly brisk waveforms bilaterally and normal digital pressures bilaterally. Overall doing well status post previous interventions.  At this point, plan 55-month follow-up with noninvasive studies.  If her flow is good at that time, we will consider switching to an annual visit going forward.    Leotis Pain, MD  04/04/2019 3:49 PM    This  note was created with Dragon medical transcription system.  Any errors from dictation are purely unintentional

## 2019-04-04 NOTE — Assessment & Plan Note (Signed)
ABIs today are 0.98 on the right and 0.88 on the left with fairly brisk waveforms bilaterally and normal digital pressures bilaterally. Overall doing well status post previous interventions.  At this point, plan 24-month follow-up with noninvasive studies.  If her flow is good at that time, we will consider switching to an annual visit going forward.

## 2019-07-04 ENCOUNTER — Other Ambulatory Visit: Payer: Self-pay | Admitting: Family Medicine

## 2019-07-04 DIAGNOSIS — Z1231 Encounter for screening mammogram for malignant neoplasm of breast: Secondary | ICD-10-CM

## 2019-07-17 ENCOUNTER — Other Ambulatory Visit: Payer: Self-pay

## 2019-07-17 ENCOUNTER — Ambulatory Visit
Admission: RE | Admit: 2019-07-17 | Discharge: 2019-07-17 | Disposition: A | Discharge status: Home / Self Care | Payer: Medicare PPO | Source: Ambulatory Visit | Attending: Family Medicine | Admitting: Family Medicine

## 2019-07-17 DIAGNOSIS — Z1231 Encounter for screening mammogram for malignant neoplasm of breast: Secondary | ICD-10-CM | POA: Insufficient documentation

## 2019-10-03 ENCOUNTER — Ambulatory Visit (INDEPENDENT_AMBULATORY_CARE_PROVIDER_SITE_OTHER): Payer: Medicare PPO | Admitting: Nurse Practitioner

## 2019-10-03 ENCOUNTER — Encounter (INDEPENDENT_AMBULATORY_CARE_PROVIDER_SITE_OTHER): Payer: Medicare Other

## 2020-01-24 DIAGNOSIS — L719 Rosacea, unspecified: Secondary | ICD-10-CM | POA: Diagnosis not present

## 2020-01-24 DIAGNOSIS — Z23 Encounter for immunization: Secondary | ICD-10-CM | POA: Diagnosis not present

## 2020-01-24 DIAGNOSIS — L84 Corns and callosities: Secondary | ICD-10-CM | POA: Diagnosis not present

## 2020-01-24 DIAGNOSIS — M791 Myalgia, unspecified site: Secondary | ICD-10-CM | POA: Diagnosis not present

## 2020-01-24 DIAGNOSIS — Z0000 Encounter for general adult medical examination without abnormal findings: Secondary | ICD-10-CM | POA: Diagnosis not present

## 2020-01-24 DIAGNOSIS — E1151 Type 2 diabetes mellitus with diabetic peripheral angiopathy without gangrene: Secondary | ICD-10-CM | POA: Diagnosis not present

## 2020-01-24 DIAGNOSIS — T466X5A Adverse effect of antihyperlipidemic and antiarteriosclerotic drugs, initial encounter: Secondary | ICD-10-CM | POA: Diagnosis not present

## 2020-01-24 DIAGNOSIS — I10 Essential (primary) hypertension: Secondary | ICD-10-CM | POA: Diagnosis not present

## 2020-01-24 DIAGNOSIS — E7800 Pure hypercholesterolemia, unspecified: Secondary | ICD-10-CM | POA: Diagnosis not present

## 2020-01-24 DIAGNOSIS — E119 Type 2 diabetes mellitus without complications: Secondary | ICD-10-CM | POA: Diagnosis not present

## 2020-02-05 DIAGNOSIS — M778 Other enthesopathies, not elsewhere classified: Secondary | ICD-10-CM | POA: Diagnosis not present

## 2020-02-05 DIAGNOSIS — L851 Acquired keratosis [keratoderma] palmaris et plantaris: Secondary | ICD-10-CM | POA: Diagnosis not present

## 2020-02-05 DIAGNOSIS — E119 Type 2 diabetes mellitus without complications: Secondary | ICD-10-CM | POA: Diagnosis not present

## 2020-02-05 DIAGNOSIS — Q828 Other specified congenital malformations of skin: Secondary | ICD-10-CM | POA: Diagnosis not present

## 2020-02-05 DIAGNOSIS — B351 Tinea unguium: Secondary | ICD-10-CM | POA: Diagnosis not present

## 2020-03-12 ENCOUNTER — Ambulatory Visit: Payer: Medicare HMO

## 2020-03-12 ENCOUNTER — Ambulatory Visit: Payer: Medicare HMO | Admitting: Podiatry

## 2020-03-12 ENCOUNTER — Other Ambulatory Visit: Payer: Self-pay

## 2020-03-12 ENCOUNTER — Encounter: Payer: Self-pay | Admitting: Podiatry

## 2020-03-12 DIAGNOSIS — Q828 Other specified congenital malformations of skin: Secondary | ICD-10-CM | POA: Diagnosis not present

## 2020-03-13 ENCOUNTER — Encounter: Payer: Self-pay | Admitting: Podiatry

## 2020-03-13 NOTE — Progress Notes (Signed)
Subjective:  Patient ID: Lynn Wood, female    DOB: 08-Nov-1945,  MRN: [id number]  Chief Complaint  Patient presents with  . Callouses    patient presents today for painful callous bottom of right forefoot x 1 month    74 y.o. female presents with the above complaint.  Patient presents with complaint of bilateral hyperkeratotic lesion to submetatarsal 2.  Patient states that the painful to walk on has been going on for 1 month has progressive gotten worse.  She would like to discuss treatment options for it.  She has tried self debridement which has helped a little bit but has not completely resolved her pain.  She has tried soaking it in vinegar and water as well which has not helped.  She states it feels that walking on a limp.  She denies any other acute complaints she has not seen anyone else prior to see me.   Review of Systems: Negative except as noted in the HPI. Denies N/V/F/Ch.  Past Medical History:  Diagnosis Date  . Benign neoplasm of colon   . Cancer ([place]) 06/2016   left breast  . Carpal tunnel syndrome   . DCIS (ductal carcinoma in situ) of breast   . Diabetes mellitus type 2, uncomplicated ([place])   . Hypertension   . Nontoxic uninodular goiter   . Pre-diabetes   . Pure hypercholesterolemia   . Vaginal vault prolapse   . Vitamin D deficiency     Current Outpatient Medications:  .  gabapentin (NEURONTIN) 300 MG capsule, Take by mouth., Disp: , Rfl:  .  rosuvastatin (CRESTOR) 5 MG tablet, Take by mouth., Disp: , Rfl:  .  amLODipine (NORVASC) 5 MG tablet, Take 5 mg by mouth daily., Disp: , Rfl:  .  aspirin EC 81 MG tablet, Take 1 tablet by mouth 1 day or 1 dose., Disp: , Rfl:  .  atenolol (TENORMIN) 50 MG tablet, Take 1 tablet (50 mg total) by mouth once daily., Disp: , Rfl:  .  Cholecalciferol (VITAMIN D) 2000 units CAPS, Take 1 capsule by mouth daily., Disp: , Rfl:  .  clobetasol cream (TEMOVATE) 6.44 %, Apply 1 application topically 2 (two) times daily as  needed. , Disp: , Rfl:  .  docusate sodium (COLACE) 100 MG capsule, Take 1 capsule (100 mg total) by mouth 2 (two) times daily., Disp: 10 capsule, Rfl: 0 .  fluticasone (FLONASE) 50 MCG/ACT nasal spray, Place 1 spray into both nostrils daily as needed for allergies. , Disp: , Rfl:  .  lisinopril (PRINIVIL,ZESTRIL) 20 MG tablet, Take 1 tablet (20 mg total) by mouth once daily., Disp: , Rfl:  .  Omega-3 Fatty Acids (FISH OIL PO), Take 1 capsule by mouth 1 day or 1 dose., Disp: , Rfl:   Social History   Tobacco Use  Smoking Status Light Tobacco Smoker  . Packs/day: 0.00  . Types: Cigarettes  Smokeless Tobacco Never Used  Tobacco Comment   1 cigarette every 1-2 weeks    Allergies  Allergen Reactions  . Lipitor [Atorvastatin] Other (See Comments)    Arthalgia  . Lipofen [Fenofibrate] Other (See Comments)    Arthalgia  . Mevacor [Lovastatin] Other (See Comments)    Arthalgia  . Zetia [Ezetimibe] Other (See Comments)    Arthalgia  . Zocor [Simvastatin] Other (See Comments)    Arthalgia   Objective:  There were no vitals filed for this visit. There is no height or weight on file to calculate BMI. Constitutional Well  developed. Well nourished.  Vascular Dorsalis pedis pulses palpable bilaterally. Posterior tibial pulses palpable bilaterally. Capillary refill normal to all digits.  No cyanosis or clubbing noted. Pedal hair growth normal.  Neurologic Normal speech. Oriented to person, place, and time. Epicritic sensation to light touch grossly present bilaterally.  Dermatologic Nails well groomed and normal in appearance. No open wounds. No skin lesions.  Orthopedic:  Hyperkeratotic lesion noted to bilateral submetatarsal 2.  Pain on palpation.  Upon debridement no pinpoint bleeding noted.   Radiographs: None Assessment:   1. Valgus deformity of great toe, unspecified laterality    Plan:  Patient was evaluated and treated and all questions answered.  Bilateral  submetatarsal 2 hyperkeratotic lesion/porokeratosis x2 -I explained the patient the etiology of porokeratosis and various treatment options were extensively discussed.  I believe patient will benefit from aggressive debridement of the lesion and offloading pads.  Patient agrees with the plan -Using chisel blade and a handle of the hyperkeratotic lesion/porokeratosis were debrided down to healthy striated tissue.  No pinpoint bleeding noted no complications noted. -Offloading metatarsal pads were dispensed -I discussed shoe gear modification with her as well.  No follow-ups on file.

## 2020-05-03 ENCOUNTER — Other Ambulatory Visit
Admission: RE | Admit: 2020-05-03 | Discharge: 2020-05-03 | Disposition: A | Discharge status: Home / Self Care | Payer: Medicare HMO | Source: Ambulatory Visit | Attending: Gastroenterology | Admitting: Gastroenterology

## 2020-05-03 ENCOUNTER — Other Ambulatory Visit: Payer: Self-pay

## 2020-05-03 DIAGNOSIS — Z20822 Contact with and (suspected) exposure to covid-19: Secondary | ICD-10-CM | POA: Diagnosis not present

## 2020-05-03 DIAGNOSIS — Z01812 Encounter for preprocedural laboratory examination: Secondary | ICD-10-CM | POA: Diagnosis not present

## 2020-05-03 LAB — SARS CORONAVIRUS 2 (TAT 6-24 HRS): SARS Coronavirus 2: NEGATIVE

## 2020-05-06 ENCOUNTER — Encounter: Payer: Self-pay | Admitting: *Deleted

## 2020-05-07 ENCOUNTER — Ambulatory Visit: Payer: Medicare HMO | Admitting: Certified Registered Nurse Anesthetist

## 2020-05-07 ENCOUNTER — Encounter: Payer: Self-pay | Admitting: *Deleted

## 2020-05-07 ENCOUNTER — Ambulatory Visit
Admission: RE | Admit: 2020-05-07 | Discharge: 2020-05-07 | Disposition: A | Discharge status: Home / Self Care | Payer: Medicare HMO | Attending: Gastroenterology | Admitting: Gastroenterology

## 2020-05-07 ENCOUNTER — Encounter
Admission: RE | Disposition: A | Discharge status: Home / Self Care | Payer: Self-pay | Source: Home / Self Care | Attending: Gastroenterology

## 2020-05-07 DIAGNOSIS — Z79899 Other long term (current) drug therapy: Secondary | ICD-10-CM | POA: Diagnosis not present

## 2020-05-07 DIAGNOSIS — F17200 Nicotine dependence, unspecified, uncomplicated: Secondary | ICD-10-CM | POA: Diagnosis not present

## 2020-05-07 DIAGNOSIS — K5790 Diverticulosis of intestine, part unspecified, without perforation or abscess without bleeding: Secondary | ICD-10-CM | POA: Diagnosis not present

## 2020-05-07 DIAGNOSIS — Z853 Personal history of malignant neoplasm of breast: Secondary | ICD-10-CM | POA: Diagnosis not present

## 2020-05-07 DIAGNOSIS — K5730 Diverticulosis of large intestine without perforation or abscess without bleeding: Secondary | ICD-10-CM | POA: Diagnosis not present

## 2020-05-07 DIAGNOSIS — Z1211 Encounter for screening for malignant neoplasm of colon: Secondary | ICD-10-CM | POA: Insufficient documentation

## 2020-05-07 DIAGNOSIS — Z7982 Long term (current) use of aspirin: Secondary | ICD-10-CM | POA: Insufficient documentation

## 2020-05-07 DIAGNOSIS — Z800 Family history of malignant neoplasm of digestive organs: Secondary | ICD-10-CM | POA: Insufficient documentation

## 2020-05-07 HISTORY — PX: COLONOSCOPY WITH PROPOFOL: SHX5780

## 2020-05-07 HISTORY — DX: Unspecified osteoarthritis, unspecified site: M19.90

## 2020-05-07 LAB — GLUCOSE, CAPILLARY: Glucose-Capillary: 83 mg/dL (ref 70–99)

## 2020-05-07 SURGERY — COLONOSCOPY WITH PROPOFOL
Anesthesia: General

## 2020-05-07 MED ORDER — PROPOFOL 10 MG/ML IV BOLUS
INTRAVENOUS | Status: DC | PRN
  Administered 2020-05-07: 30 mg via INTRAVENOUS

## 2020-05-07 MED ORDER — LIDOCAINE HCL (CARDIAC) PF 100 MG/5ML IV SOSY
PREFILLED_SYRINGE | INTRAVENOUS | Status: DC | PRN
  Administered 2020-05-07: 100 mg via INTRAVENOUS

## 2020-05-07 MED ORDER — PROPOFOL 500 MG/50ML IV EMUL
INTRAVENOUS | Status: DC | PRN
  Administered 2020-05-07: 125 ug/kg/min via INTRAVENOUS

## 2020-05-07 MED ORDER — SODIUM CHLORIDE 0.9 % IV SOLN
INTRAVENOUS | Status: DC
  Administered 2020-05-07: 1000 mL via INTRAVENOUS

## 2020-05-07 NOTE — H&P (Signed)
Outpatient short stay form Pre-procedure 05/07/2020 1:07 PM Lynn Miyamoto MD, MPH  Primary Physician: Dr. Ellison Hughs  Reason for visit:  Screening colonoscopy  History of present illness:   74 y/o lady with strong family history of colon cancer in her mother and two brothers in their 43's/70's here for screening colon. Normal colon 5 years ago. No blood thinners or abdominal surgeries. No new GI symptoms.    Current Facility-Administered Medications:  .  0.9 %  sodium chloride infusion, , Intravenous, Continuous, Aaria Happ, Hilton Cork, MD, Last Rate: 20 mL/hr at 05/07/20 1252, 1,000 mL at 05/07/20 1252  Medications Prior to Admission  Medication Sig Dispense Refill Last Dose  . amLODipine (NORVASC) 5 MG tablet Take 5 mg by mouth daily.   Past Week at Unknown time  . aspirin EC 81 MG tablet Take 1 tablet by mouth 1 day or 1 dose.   Past Week at Unknown time  . atenolol (TENORMIN) 50 MG tablet Take 1 tablet (50 mg total) by mouth once daily.   05/06/2020 at Unknown time  . Cholecalciferol (VITAMIN D) 2000 units CAPS Take 1 capsule by mouth daily.   Past Week at Unknown time  . clobetasol cream (TEMOVATE) 4.01 % Apply 1 application topically 2 (two) times daily as needed.    05/06/2020 at Unknown time  . docusate sodium (COLACE) 100 MG capsule Take 1 capsule (100 mg total) by mouth 2 (two) times daily. 10 capsule 0 Past Week at Unknown time  . fluticasone (FLONASE) 50 MCG/ACT nasal spray Place 1 spray into both nostrils daily as needed for allergies.    Past Week at Unknown time  . gabapentin (NEURONTIN) 300 MG capsule Take by mouth.   Past Week at Unknown time  . lisinopril (PRINIVIL,ZESTRIL) 20 MG tablet Take 1 tablet (20 mg total) by mouth once daily.   05/06/2020 at Unknown time  . Omega-3 Fatty Acids (FISH OIL PO) Take 1 capsule by mouth 1 day or 1 dose.   Past Week at Unknown time  . rosuvastatin (CRESTOR) 5 MG tablet Take by mouth.        Allergies  Allergen Reactions  . Lipitor  [Atorvastatin] Other (See Comments)    Arthalgia  . Lipofen [Fenofibrate] Other (See Comments)    Arthalgia  . Mevacor [Lovastatin] Other (See Comments)    Arthalgia  . Zetia [Ezetimibe] Other (See Comments)    Arthalgia  . Zocor [Simvastatin] Other (See Comments)    Arthalgia     Past Medical History:  Diagnosis Date  . Arthritis   . Benign neoplasm of colon   . Cancer ([place]) 06/2016   left breast  . Carpal tunnel syndrome   . DCIS (ductal carcinoma in situ) of breast   . Diabetes mellitus type 2, uncomplicated ([place])   . Hypertension   . Nontoxic uninodular goiter   . Pre-diabetes   . Pure hypercholesterolemia   . Vaginal vault prolapse   . Vaginal vault prolapse   . Vitamin D deficiency   . Vitamin D deficiency     Review of systems:  Otherwise negative.    Physical Exam  Gen: Alert, oriented. Appears stated age.  HEENT: PERRLA. Lungs: No respiratory distress CV: RRR Abd: soft, benign, no masses Ext: No edema.    Planned procedures: Proceed with colonoscopy. The patient understands the nature of the planned procedure, indications, risks, alternatives and potential complications including but not limited to bleeding, infection, perforation, damage to internal organs and possible oversedation/side effects from  anesthesia. The patient agrees and gives consent to proceed.  Please refer to procedure notes for findings, recommendations and patient disposition/instructions.     Lynn Miyamoto MD, MPH Gastroenterology 05/07/2020  1:07 PM

## 2020-05-07 NOTE — Transfer of Care (Signed)
Immediate Anesthesia Transfer of Care Note  Patient: Lynn Wood  Procedure(s) Performed: COLONOSCOPY WITH PROPOFOL (N/A )  Patient Location: PACU and Endoscopy Unit  Anesthesia Type:General  Level of Consciousness: drowsy  Airway & Oxygen Therapy: Patient Spontanous Breathing  Post-op Assessment: Report given to RN and Post -op Vital signs reviewed and stable  Post vital signs: Reviewed and stable  Last Vitals:  Vitals Value Taken Time  BP 119/69 05/07/20 1334  Temp 36.6 C 05/07/20 1334  Pulse 70 05/07/20 1335  Resp 22 05/07/20 1335  SpO2 99 % 05/07/20 1335  Vitals shown include unvalidated device data.  Last Pain:  Vitals:   05/07/20 1334  TempSrc: Temporal  PainSc: Asleep         Complications: No complications documented.

## 2020-05-07 NOTE — Interval H&P Note (Signed)
History and Physical Interval Note:  05/07/2020 1:10 PM  Lynn Wood  has presented today for surgery, with the diagnosis of FAM HX OF COLON CA.  The various methods of treatment have been discussed with the patient and family. After consideration of risks, benefits and other options for treatment, the patient has consented to  Procedure(s): COLONOSCOPY WITH PROPOFOL (N/A) as a surgical intervention.  The patient's history has been reviewed, patient examined, no change in status, stable for surgery.  I have reviewed the patient's chart and labs.  Questions were answered to the patient's satisfaction.     Lesly Rubenstein  Ok to proceed with colonoscopy

## 2020-05-07 NOTE — Anesthesia Preprocedure Evaluation (Signed)
Anesthesia Evaluation  Patient identified by MRN, date of birth, ID band Patient awake    Reviewed: Allergy & Precautions, NPO status , Patient's Chart, lab work & pertinent test results  History of Anesthesia Complications Negative for: history of anesthetic complications  Airway Mallampati: II  TM Distance: >3 FB Neck ROM: Full    Dental  (+) Upper Dentures, Lower Dentures   Pulmonary neg sleep apnea, neg COPD, Current Smoker and Patient abstained from smoking.,    breath sounds clear to auscultation- rhonchi (-) wheezing      Cardiovascular hypertension, Pt. on medications (-) CAD, (-) Past MI, (-) Cardiac Stents and (-) CABG  Rhythm:Regular Rate:Normal - Systolic murmurs and - Diastolic murmurs    Neuro/Psych neg Seizures negative neurological ROS  negative psych ROS   GI/Hepatic negative GI ROS, Neg liver ROS,   Endo/Other  diabetes (prediabetic)  Renal/GU negative Renal ROS     Musculoskeletal  (+) Arthritis ,   Abdominal (+) - obese,   Peds  Hematology negative hematology ROS (+)   Anesthesia Other Findings Past Medical History: No date: Arthritis No date: Benign neoplasm of colon 06/2016: Cancer ([place])     Comment:  left breast No date: Carpal tunnel syndrome No date: DCIS (ductal carcinoma in situ) of breast No date: Diabetes mellitus type 2, uncomplicated ([facility]) No date: Hypertension No date: Nontoxic uninodular goiter No date: Pre-diabetes No date: Pure hypercholesterolemia No date: Vaginal vault prolapse No date: Vaginal vault prolapse No date: Vitamin D deficiency No date: Vitamin D deficiency   Reproductive/Obstetrics                             Anesthesia Physical Anesthesia Plan  ASA: II  Anesthesia Plan: General   Post-op Pain Management:    Induction: Intravenous  PONV Risk Score and Plan: 1 and Propofol infusion  Airway Management Planned: Natural  Airway  Additional Equipment:   Intra-op Plan:   Post-operative Plan:   Informed Consent: I have reviewed the patients History and Physical, chart, labs and discussed the procedure including the risks, benefits and alternatives for the proposed anesthesia with the patient or authorized representative who has indicated his/her understanding and acceptance.     Dental advisory given  Plan Discussed with: CRNA and Anesthesiologist  Anesthesia Plan Comments:         Anesthesia Quick Evaluation

## 2020-05-07 NOTE — Anesthesia Procedure Notes (Signed)
Procedure Name: MAC Date/Time: 05/07/2020 1:22 PM Performed by: Lily Peer, Ellana Kawa, CRNA Pre-anesthesia Checklist: Patient identified, Emergency Drugs available, Suction available and Patient being monitored Oxygen Delivery Method: Nasal cannula

## 2020-05-07 NOTE — Anesthesia Postprocedure Evaluation (Signed)
Anesthesia Post Note  Patient: Lynn Wood  Procedure(s) Performed: COLONOSCOPY WITH PROPOFOL (N/A )  Patient location during evaluation: Endoscopy Anesthesia Type: General Level of consciousness: awake and alert and oriented Pain management: pain level controlled Vital Signs Assessment: post-procedure vital signs reviewed and stable Respiratory status: spontaneous breathing, nonlabored ventilation and respiratory function stable Cardiovascular status: blood pressure returned to baseline and stable Postop Assessment: no signs of nausea or vomiting Anesthetic complications: no   No complications documented.   Last Vitals:  Vitals:   05/07/20 1233 05/07/20 1334  BP: (!) 176/73 119/69  Pulse: 63 72  Resp: 17 (!) 22  Temp: (!) 35.7 C 36.6 C  SpO2: 100% 99%    Last Pain:  Vitals:   05/07/20 1354  TempSrc:   PainSc: 0-No pain                 Josealberto Montalto

## 2020-05-07 NOTE — Op Note (Signed)
[facility] Gastroenterology Patient Name: Lynn Wood Procedure Date: 05/07/2020 1:09 PM MRN: [id number] Account #: [id number] Date of Birth: May 08, 1946 Admit Type: Outpatient Age: 74 Room: [facility] ENDO ROOM 1 Gender: Female Note Status: Finalized Procedure:             Colonoscopy Indications:           Screening patient at increased risk: Family history of                         colorectal cancer in multiple 1st-degree relatives Providers:             Andrey Farmer MD, MD Medicines:             Monitored Anesthesia Care Complications:         No immediate complications. Procedure:             Pre-Anesthesia Assessment:                        - Prior to the procedure, a History and Physical was                         performed, and patient medications and allergies were                         reviewed. The patient is competent. The risks and                         benefits of the procedure and the sedation options and                         risks were discussed with the patient. All questions                         were answered and informed consent was obtained.                         Patient identification and proposed procedure were                         verified by the physician, the nurse, the anesthetist                         and the technician in the endoscopy suite. Mental                         Status Examination: alert and oriented. Airway                         Examination: normal oropharyngeal airway and neck                         mobility. Respiratory Examination: clear to                         auscultation. CV Examination: normal. Prophylactic                         Antibiotics: The patient does not require prophylactic  antibiotics. Prior Anticoagulants: The patient has                         taken no previous anticoagulant or antiplatelet                         agents. ASA Grade Assessment: II - A  patient with mild                         systemic disease. After reviewing the risks and                         benefits, the patient was deemed in satisfactory                         condition to undergo the procedure. The anesthesia                         plan was to use monitored anesthesia care (MAC).                         Immediately prior to administration of medications,                         the patient was re-assessed for adequacy to receive                         sedatives. The heart rate, respiratory rate, oxygen                         saturations, blood pressure, adequacy of pulmonary                         ventilation, and response to care were monitored                         throughout the procedure. The physical status of the                         patient was re-assessed after the procedure.                        After obtaining informed consent, the colonoscope was                         passed under direct vision. Throughout the procedure,                         the patient's blood pressure, pulse, and oxygen                         saturations were monitored continuously. The                         Colonoscope was introduced through the anus and                         advanced to the the cecum, identified by appendiceal  orifice and ileocecal valve. The colonoscopy was                         performed without difficulty. The patient tolerated                         the procedure well. The quality of the bowel                         preparation was good. Findings:      The perianal and digital rectal examinations were normal.      Multiple small-mouthed diverticula were found in the sigmoid colon and       descending colon.      The exam was otherwise without abnormality on direct and retroflexion       views. Impression:            - Diverticulosis in the sigmoid colon and in the                         descending colon.                         - The examination was otherwise normal on direct and                         retroflexion views.                        - No specimens collected. Recommendation:        - Discharge patient to home.                        - Resume previous diet.                        - Continue present medications.                        - Repeat colonoscopy in 5 years for screening purposes.                        - Return to referring physician as previously                         scheduled. Procedure Code(s):     --- Professional ---                        K5993, Colorectal cancer screening; colonoscopy on                         individual at high risk Diagnosis Code(s):     --- Professional ---                        Z80.0, Family history of malignant neoplasm of                         digestive organs                        K57.30, Diverticulosis of large  intestine without                         perforation or abscess without bleeding CPT copyright 2019 [employer]. All rights reserved. The codes documented in this report are preliminary and upon coder review may  be revised to meet current compliance requirements. Andrey Farmer, MD Andrey Farmer MD, MD 05/07/2020 1:33:55 PM Number of Addenda: 0 Note Initiated On: 05/07/2020 1:09 PM Scope Withdrawal Time: 0 hours 5 minutes 11 seconds  Total Procedure Duration: 0 hours 9 minutes 7 seconds  Estimated Blood Loss:  Estimated blood loss: none.      [facility]

## 2020-05-13 ENCOUNTER — Encounter: Payer: Self-pay | Admitting: Gastroenterology

## 2020-05-30 DIAGNOSIS — G5601 Carpal tunnel syndrome, right upper limb: Secondary | ICD-10-CM | POA: Diagnosis not present

## 2020-05-30 DIAGNOSIS — R251 Tremor, unspecified: Secondary | ICD-10-CM | POA: Diagnosis not present

## 2020-06-11 ENCOUNTER — Ambulatory Visit: Payer: Medicare HMO | Admitting: Podiatry

## 2020-08-01 ENCOUNTER — Ambulatory Visit: Payer: Medicare HMO | Admitting: Podiatry

## 2020-08-01 ENCOUNTER — Other Ambulatory Visit: Payer: Self-pay

## 2020-08-01 ENCOUNTER — Encounter: Payer: Self-pay | Admitting: Podiatry

## 2020-08-01 DIAGNOSIS — M79675 Pain in left toe(s): Secondary | ICD-10-CM

## 2020-08-01 DIAGNOSIS — M79674 Pain in right toe(s): Secondary | ICD-10-CM | POA: Diagnosis not present

## 2020-08-01 DIAGNOSIS — Q828 Other specified congenital malformations of skin: Secondary | ICD-10-CM

## 2020-08-01 DIAGNOSIS — B351 Tinea unguium: Secondary | ICD-10-CM | POA: Diagnosis not present

## 2020-08-01 NOTE — Progress Notes (Signed)
  Subjective:  Patient ID: Lynn Wood, female    DOB: 1946/05/15,  MRN: [id number]  Chief Complaint  Patient presents with  . Callouses  . Nail Problem    Nail callous trim, painful callouses bilat forefeet  rt >lt   75 y.o. female returns for the above complaint.  Patient presents with thickened elongated dystrophic toenails x10.  Patient states painful to touch.  Patient would like for me to debride them.  She is not able to do so.  She denies any other acute complaints.  She is not a diabetic.  She would also like for me to debride down her right submetatarsal 2 and right submetatarsal 5 porokeratosis with hyperkeratotic lesion.  Patient states it painful to touch she is not able to ambulate properly because of lesions.  She denies any other acute complaints.  Objective:  There were no vitals filed for this visit. Podiatric Exam: Vascular: dorsalis pedis and posterior tibial pulses are palpable bilateral. Capillary return is immediate. Temperature gradient is WNL. Skin turgor WNL  Sensorium: Normal Semmes Weinstein monofilament test. Normal tactile sensation bilaterally. Nail Exam: Pt has thick disfigured discolored nails with subungual debris noted bilateral entire nail hallux through fifth toenails.  Pain on palpation to the nails. Ulcer Exam: There is no evidence of ulcer or pre-ulcerative changes or infection. Orthopedic Exam: Muscle tone and strength are WNL. No limitations in general ROM. No crepitus or effusions noted. HAV  B/L.  Hammer toes 2-5  B/L. Skin: Porokeratosis noted to right submetatarsal 5 and submetatarsal 2.  Pain on palpation.. No infection or ulcers    Assessment & Plan:   1. Porokeratosis   2. Pain due to onychomycosis of toenails of both feet     Patient was evaluated and treated and all questions answered.  Right submetatarsal 5 and submetatarsal 2 porokeratosis -I splinted patient the etiology of porokeratosis and various treatment options were  discussed.  Given the amount of pain she is having I believe patient will benefit from aggressive debridement of the lesions.  Patient agrees with plan would like to proceed with the debridement. -Using chisel blade and handle the debridement were carried out in standard technique.  Chisel was used to excise out the central nucleated core.  These were done for both lesions x2.  Onychomycosis with pain  -Nails palliatively debrided as below. -Educated on self-care  Procedure: Nail Debridement Rationale: pain  Type of Debridement: manual, sharp debridement. Instrumentation: Nail nipper, rotary burr. Number of Nails: 10  Procedures and Treatment: Consent by patient was obtained for treatment procedures. The patient understood the discussion of treatment and procedures well. All questions were answered thoroughly reviewed. Debridement of mycotic and hypertrophic toenails, 1 through 5 bilateral and clearing of subungual debris. No ulceration, no infection noted.  Return Visit-Office Procedure: Patient instructed to return to the office for a follow up visit 3 months for continued evaluation and treatment.  Boneta Lucks, DPM    No follow-ups on file.

## 2020-09-02 ENCOUNTER — Other Ambulatory Visit: Payer: Self-pay | Admitting: Family Medicine

## 2020-09-02 DIAGNOSIS — T466X5A Adverse effect of antihyperlipidemic and antiarteriosclerotic drugs, initial encounter: Secondary | ICD-10-CM | POA: Diagnosis not present

## 2020-09-02 DIAGNOSIS — L719 Rosacea, unspecified: Secondary | ICD-10-CM | POA: Diagnosis not present

## 2020-09-02 DIAGNOSIS — M791 Myalgia, unspecified site: Secondary | ICD-10-CM | POA: Diagnosis not present

## 2020-09-02 DIAGNOSIS — E1151 Type 2 diabetes mellitus with diabetic peripheral angiopathy without gangrene: Secondary | ICD-10-CM | POA: Diagnosis not present

## 2020-09-02 DIAGNOSIS — Z1231 Encounter for screening mammogram for malignant neoplasm of breast: Secondary | ICD-10-CM | POA: Diagnosis not present

## 2020-09-02 DIAGNOSIS — I10 Essential (primary) hypertension: Secondary | ICD-10-CM | POA: Diagnosis not present

## 2020-09-02 DIAGNOSIS — R251 Tremor, unspecified: Secondary | ICD-10-CM | POA: Diagnosis not present

## 2020-09-02 DIAGNOSIS — E7800 Pure hypercholesterolemia, unspecified: Secondary | ICD-10-CM | POA: Diagnosis not present

## 2020-09-10 ENCOUNTER — Ambulatory Visit
Admission: RE | Admit: 2020-09-10 | Discharge: 2020-09-10 | Disposition: A | Discharge status: Home / Self Care | Payer: Medicare HMO | Source: Ambulatory Visit | Attending: Family Medicine | Admitting: Family Medicine

## 2020-09-10 ENCOUNTER — Other Ambulatory Visit: Payer: Self-pay

## 2020-09-10 DIAGNOSIS — Z1231 Encounter for screening mammogram for malignant neoplasm of breast: Secondary | ICD-10-CM | POA: Diagnosis not present

## 2020-09-16 ENCOUNTER — Other Ambulatory Visit: Payer: Self-pay | Admitting: Family Medicine

## 2020-09-16 DIAGNOSIS — R928 Other abnormal and inconclusive findings on diagnostic imaging of breast: Secondary | ICD-10-CM

## 2020-09-16 DIAGNOSIS — N6310 Unspecified lump in the right breast, unspecified quadrant: Secondary | ICD-10-CM

## 2020-09-17 ENCOUNTER — Ambulatory Visit
Admission: RE | Admit: 2020-09-17 | Discharge: 2020-09-17 | Disposition: A | Discharge status: Home / Self Care | Payer: Medicare HMO | Source: Ambulatory Visit | Attending: Family Medicine | Admitting: Family Medicine

## 2020-09-17 ENCOUNTER — Other Ambulatory Visit: Payer: Self-pay

## 2020-09-17 DIAGNOSIS — N6310 Unspecified lump in the right breast, unspecified quadrant: Secondary | ICD-10-CM

## 2020-09-17 DIAGNOSIS — R928 Other abnormal and inconclusive findings on diagnostic imaging of breast: Secondary | ICD-10-CM

## 2020-09-17 DIAGNOSIS — N6312 Unspecified lump in the right breast, upper inner quadrant: Secondary | ICD-10-CM | POA: Diagnosis not present

## 2020-10-29 ENCOUNTER — Ambulatory Visit: Payer: Medicare HMO | Admitting: Podiatry

## 2020-10-31 ENCOUNTER — Ambulatory Visit: Payer: Medicare HMO | Admitting: Podiatry

## 2020-11-07 ENCOUNTER — Ambulatory Visit (INDEPENDENT_AMBULATORY_CARE_PROVIDER_SITE_OTHER): Payer: Medicare HMO

## 2020-11-07 ENCOUNTER — Ambulatory Visit: Payer: Medicare HMO | Admitting: Podiatry

## 2020-11-07 ENCOUNTER — Other Ambulatory Visit: Payer: Self-pay

## 2020-11-07 DIAGNOSIS — M216X1 Other acquired deformities of right foot: Secondary | ICD-10-CM | POA: Diagnosis not present

## 2020-11-07 DIAGNOSIS — Q828 Other specified congenital malformations of skin: Secondary | ICD-10-CM | POA: Diagnosis not present

## 2020-11-12 ENCOUNTER — Encounter: Payer: Self-pay | Admitting: Podiatry

## 2020-11-12 NOTE — Progress Notes (Signed)
  Subjective:  Patient ID: Lynn Wood, female    DOB: 05-May-1946,  MRN: [id number]  Chief Complaint  Patient presents with  . Callouses    Callus    75 y.o. female returns for the above complaint.  Patient presents with primary complaint of right submetatarsal 2 porokeratosis/benign skin lesion with underlying plantarflexed metatarsal.  She states it hurts to ambulate.  She is tried shoe gear modification padding protecting none of which has helped.  She would like to discuss treatment options at this time.  She denies any other acute complaints.  Objective:  There were no vitals filed for this visit. Podiatric Exam: Vascular: dorsalis pedis and posterior tibial pulses are palpable bilateral. Capillary return is immediate. Temperature gradient is WNL. Skin turgor WNL  Sensorium: Normal Semmes Weinstein monofilament test. Normal tactile sensation bilaterally. Nail Exam: Pt has thick disfigured discolored nails with subungual debris noted bilateral entire nail hallux through fifth toenails.  Pain on palpation to the nails. Ulcer Exam: There is no evidence of ulcer or pre-ulcerative changes or infection. Orthopedic Exam: Muscle tone and strength are WNL. No limitations in general ROM. No crepitus or effusions noted.  Plantarflexed second metatarsal noted with underlying porokeratosis.  No hammertoe contracture present.  Bunion deformity noted to the right side Skin: Porokeratosis noted to right submetatarsal 5 and submetatarsal 2.  Pain on palpation.. No infection or ulcers  Radiographs: 3 views of skeletally mature the right foot: Moderate bunion deformity noted with sesamoid position of 5 out of 7.  There is an increase in intermetatarsal angle.  Metatarsal parabola is intact.  Slightly plantarflexed second metatarsal noted.  No hammertoe contracture noted.  Assessment & Plan:   1. Porokeratosis   2. 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  Right submetatarsal 2 porokeratosis/plantarflexed metatarsal -I explained to the patient the etiology of porokeratosis and various treatment options were extensively discussed.  Given that she has an underlying plantarflexed metatarsal I believe she will benefit from surgical intervention with floating osteotomy of the second metatarsal head.  Given the amount of pain that she is having without any resolve meant she would like to proceed with surgical options.  She has failed all conservative treatment options.  I discussed my preoperative planning as well as my postoperative plan in extensive detail.  She can be weightbearing as tolerated in surgical shoe.  Surgical shoe was dispensed. -My surgical planning includes floating osteotomy of the second metatarsal health to take the pressure away from submetatarsal 2 and give her relief. -Informed surgical risk consent was reviewed and read aloud to the patient.  I reviewed the films.  I have discussed my findings with the patient in great detail.  I have discussed all risks including but not limited to infection, stiffness, scarring, limp, disability, deformity, damage to blood vessels and nerves, numbness, poor healing, need for braces, arthritis, chronic pain, amputation, death.  All benefits and realistic expectations discussed in great detail.  I have made no promises as to the outcome.  I have provided realistic expectations.  I have offered the patient a 2nd opinion, which they have declined and assured me they preferred to proceed despite the risks ' Boneta Lucks, DPM    No follow-ups on file.

## 2020-12-13 ENCOUNTER — Telehealth: Payer: Self-pay | Admitting: Urology

## 2020-12-13 NOTE — Telephone Encounter (Signed)
DOS - 12/30/20  METATARSAL OSTEOTOMY 2ND RIGHT --- 98921   [employer] EFFECTIVE DATE - 11/14/19   SPOKE WITH MATTHEW WITH [facility] AND HE STATED THAT CPT CODE 19417 NO PRIOR AUTH REQUIRED.  REF # [id number] 12/13/20 AT 10:07

## 2020-12-24 DIAGNOSIS — M1711 Unilateral primary osteoarthritis, right knee: Secondary | ICD-10-CM | POA: Diagnosis not present

## 2020-12-30 ENCOUNTER — Encounter: Payer: Self-pay | Admitting: Podiatry

## 2020-12-30 ENCOUNTER — Other Ambulatory Visit: Payer: Self-pay | Admitting: Podiatry

## 2020-12-30 DIAGNOSIS — M216X1 Other acquired deformities of right foot: Secondary | ICD-10-CM | POA: Diagnosis not present

## 2020-12-30 DIAGNOSIS — M21541 Acquired clubfoot, right foot: Secondary | ICD-10-CM | POA: Diagnosis not present

## 2020-12-30 MED ORDER — OXYCODONE-ACETAMINOPHEN 5-325 MG PO TABS: 1.0000 | ORAL_TABLET | ORAL | 0 refills | Status: DC | PRN

## 2020-12-30 MED ORDER — IBUPROFEN 800 MG PO TABS: 800.0000 mg | ORAL_TABLET | Freq: Four times a day (QID) | ORAL | 1 refills | Status: DC | PRN

## 2021-01-07 ENCOUNTER — Other Ambulatory Visit: Payer: Self-pay

## 2021-01-07 ENCOUNTER — Encounter: Payer: Self-pay | Admitting: Podiatry

## 2021-01-07 ENCOUNTER — Ambulatory Visit (INDEPENDENT_AMBULATORY_CARE_PROVIDER_SITE_OTHER): Payer: Medicare HMO

## 2021-01-07 ENCOUNTER — Ambulatory Visit (INDEPENDENT_AMBULATORY_CARE_PROVIDER_SITE_OTHER): Payer: Medicare HMO | Admitting: Podiatry

## 2021-01-07 DIAGNOSIS — Z9889 Other specified postprocedural states: Secondary | ICD-10-CM

## 2021-01-07 DIAGNOSIS — M216X1 Other acquired deformities of right foot: Secondary | ICD-10-CM

## 2021-01-07 DIAGNOSIS — Q828 Other specified congenital malformations of skin: Secondary | ICD-10-CM

## 2021-01-07 NOTE — Progress Notes (Signed)
Subjective:  Patient ID: Lynn Wood, female    DOB: [birth year],  MRN: [id number]  Chief Complaint  Patient presents with   Routine Post Op    POST OP DOS 7.18.22    DOS: 12/30/2020 Procedure: Right second metatarsal floating osteotomy  75 y.o. female returns for post-op check.  Patient states she is doing well.  No acute complaints.  Bandages clean dry and intact.  Stitches are intact.  Pain controlled with pain medication  Review of Systems: Negative except as noted in the HPI. Denies N/V/F/Ch.  Past Medical History:  Diagnosis Date   Arthritis    Benign neoplasm of colon    Breast cancer ([place]) 08/2016   left   Cancer ([place]) 06/2016   left breast   Carpal tunnel syndrome    DCIS (ductal carcinoma in situ) of breast    Diabetes mellitus type 2, uncomplicated (HCC)    Hypertension    Nontoxic uninodular goiter    Pre-diabetes    Pure hypercholesterolemia    Vaginal vault prolapse    Vaginal vault prolapse    Vitamin D deficiency    Vitamin D deficiency     Current Outpatient Medications:    amLODipine (NORVASC) 5 MG tablet, Take 5 mg by mouth daily., Disp: , Rfl:    aspirin EC 81 MG tablet, Take 1 tablet by mouth 1 day or 1 dose., Disp: , Rfl:    atenolol (TENORMIN) 50 MG tablet, Take 1 tablet (50 mg total) by mouth once daily., Disp: , Rfl:    Cholecalciferol (VITAMIN D) 2000 units CAPS, Take 1 capsule by mouth daily., Disp: , Rfl:    clobetasol cream (TEMOVATE) AB-123456789 %, Apply 1 application topically 2 (two) times daily as needed. , Disp: , Rfl:    docusate sodium (COLACE) 100 MG capsule, Take 1 capsule (100 mg total) by mouth 2 (two) times daily., Disp: 10 capsule, Rfl: 0   fluticasone (FLONASE) 50 MCG/ACT nasal spray, Place 1 spray into both nostrils daily as needed for allergies. , Disp: , Rfl:    gabapentin (NEURONTIN) 300 MG capsule, Take by mouth., Disp: , Rfl:    ibuprofen (ADVIL) 800 MG tablet, Take 1 tablet (800 mg total) by mouth every 6 (six) hours as  needed., Disp: 60 tablet, Rfl: 1   lisinopril (PRINIVIL,ZESTRIL) 20 MG tablet, Take 1 tablet (20 mg total) by mouth once daily., Disp: , Rfl:    Omega-3 Fatty Acids (FISH OIL PO), Take 1 capsule by mouth 1 day or 1 dose., Disp: , Rfl:    oxyCODONE-acetaminophen (PERCOCET) 5-325 MG tablet, Take 1-2 tablets by mouth every 4 (four) hours as needed for severe pain., Disp: 30 tablet, Rfl: 0   rosuvastatin (CRESTOR) 5 MG tablet, Take by mouth., Disp: , Rfl:   Social History   Tobacco Use  Smoking Status Light Smoker   Packs/day: 0.25   Types: Cigarettes  Smokeless Tobacco Never  Tobacco Comments   1 cigarette every 1-2 weeks    Allergies  Allergen Reactions   Lipitor [Atorvastatin] Other (See Comments)    Arthalgia   Lipofen [Fenofibrate] Other (See Comments)    Arthalgia   Mevacor [Lovastatin] Other (See Comments)    Arthalgia   Zetia [Ezetimibe] Other (See Comments)    Arthalgia   Zocor [Simvastatin] Other (See Comments)    Arthalgia   Objective:  There were no vitals filed for this visit. There is no height or weight on file to calculate BMI. Constitutional Well developed. Well  nourished.  Vascular Foot warm and well perfused. Capillary refill normal to all digits.   Neurologic Normal speech. Oriented to person, place, and time. Epicritic sensation to light touch grossly present bilaterally.  Dermatologic Skin healing well without signs of infection. Skin edges well coapted without signs of infection.  Orthopedic: Tenderness to palpation noted about the surgical site.   Radiographs: 3 views of skeletally mature the right foot: Good correction alignment noted.  Osteotomy of the second metatarsal noted.  Dorsally translated as expected Assessment:   1. 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 Porokeratosis   3. Status post foot surgery    Plan:  Patient was evaluated and treated and all questions answered.  S/p foot surgery right -Progressing as expected  post-operatively. -XR: See above -WB Status: Weightbearing as tolerated in surgical shoe -Sutures: Intact.  No clinical signs of dehiscence noted.  No complication noted. -Medications: None -Foot redressed.  No follow-ups on file.

## 2021-01-21 ENCOUNTER — Other Ambulatory Visit: Payer: Self-pay

## 2021-01-21 ENCOUNTER — Ambulatory Visit (INDEPENDENT_AMBULATORY_CARE_PROVIDER_SITE_OTHER): Payer: Medicare HMO | Admitting: Podiatry

## 2021-01-21 ENCOUNTER — Encounter: Payer: Self-pay | Admitting: Podiatry

## 2021-01-21 VITALS — Temp 98.1°F

## 2021-01-21 DIAGNOSIS — Z9889 Other specified postprocedural states: Secondary | ICD-10-CM

## 2021-01-21 DIAGNOSIS — M216X1 Other acquired deformities of right foot: Secondary | ICD-10-CM

## 2021-01-21 NOTE — Progress Notes (Signed)
Subjective:  Patient ID: Lynn Wood, female    DOB: 1945/07/01,  MRN: [id number]  Chief Complaint  Patient presents with   Routine Post Op    "I guess it's okay.  Pain still shoots through it, it's like a burn."    DOS: 12/30/2020 Procedure: Right second metatarsal floating osteotomy  75 y.o. female returns for post-op check.  Patient states she is doing well.  No acute complaints.  Bandages clean dry and intact.  Stitches are intact.  Pain is controlled  Review of Systems: Negative except as noted in the HPI. Denies N/V/F/Ch.  Past Medical History:  Diagnosis Date   Arthritis    Benign neoplasm of colon    Breast cancer ([place]) 08/2016   left   Cancer ([place]) 06/2016   left breast   Carpal tunnel syndrome    DCIS (ductal carcinoma in situ) of breast    Diabetes mellitus type 2, uncomplicated (HCC)    Hypertension    Nontoxic uninodular goiter    Pre-diabetes    Pure hypercholesterolemia    Vaginal vault prolapse    Vaginal vault prolapse    Vitamin D deficiency    Vitamin D deficiency     Current Outpatient Medications:    amLODipine (NORVASC) 5 MG tablet, Take 5 mg by mouth daily., Disp: , Rfl:    aspirin EC 81 MG tablet, Take 1 tablet by mouth 1 day or 1 dose., Disp: , Rfl:    atenolol (TENORMIN) 50 MG tablet, Take 1 tablet (50 mg total) by mouth once daily., Disp: , Rfl:    Cholecalciferol (VITAMIN D) 2000 units CAPS, Take 1 capsule by mouth daily., Disp: , Rfl:    clobetasol cream (TEMOVATE) AB-123456789 %, Apply 1 application topically 2 (two) times daily as needed. , Disp: , Rfl:    docusate sodium (COLACE) 100 MG capsule, Take 1 capsule (100 mg total) by mouth 2 (two) times daily., Disp: 10 capsule, Rfl: 0   fluticasone (FLONASE) 50 MCG/ACT nasal spray, Place 1 spray into both nostrils daily as needed for allergies. , Disp: , Rfl:    gabapentin (NEURONTIN) 300 MG capsule, Take by mouth., Disp: , Rfl:    ibuprofen (ADVIL) 800 MG tablet, Take 1 tablet (800 mg total)  by mouth every 6 (six) hours as needed., Disp: 60 tablet, Rfl: 1   lisinopril (PRINIVIL,ZESTRIL) 20 MG tablet, Take 1 tablet (20 mg total) by mouth once daily., Disp: , Rfl:    Omega-3 Fatty Acids (FISH OIL PO), Take 1 capsule by mouth 1 day or 1 dose., Disp: , Rfl:    oxyCODONE-acetaminophen (PERCOCET) 5-325 MG tablet, Take 1-2 tablets by mouth every 4 (four) hours as needed for severe pain., Disp: 30 tablet, Rfl: 0   rosuvastatin (CRESTOR) 5 MG tablet, Take by mouth., Disp: , Rfl:   Social History   Tobacco Use  Smoking Status Light Smoker   Packs/day: 0.25   Types: Cigarettes  Smokeless Tobacco Never  Tobacco Comments   1 cigarette every 1-2 weeks    Allergies  Allergen Reactions   Lipitor [Atorvastatin] Other (See Comments)    Arthalgia   Lipofen [Fenofibrate] Other (See Comments)    Arthalgia   Mevacor [Lovastatin] Other (See Comments)    Arthalgia   Zetia [Ezetimibe] Other (See Comments)    Arthalgia   Zocor [Simvastatin] Other (See Comments)    Arthalgia   Objective:   Vitals:   01/21/21 1428  Temp: 98.1 F (36.7 C)   There  is no height or weight on file to calculate BMI. Constitutional Well developed. Well nourished.  Vascular Foot warm and well perfused. Capillary refill normal to all digits.   Neurologic Normal speech. Oriented to person, place, and time. Epicritic sensation to light touch grossly present bilaterally.  Dermatologic Skin completely epithelialized.  No clinical signs of infection noted.  Good correction alignment noted.  Resolving hyperkeratotic lesion  Orthopedic: Tenderness to palpation noted about the surgical site.   Radiographs: 3 views of skeletally mature the right foot: Good correction alignment noted.  Osteotomy of the second metatarsal noted.  Dorsally translated as expected Assessment:   1. 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 Status post foot surgery     Plan:  Patient was evaluated and treated and all questions  answered.  S/p foot surgery right -Progressing as expected post-operatively. -XR: See above -WB Status: Transition to regular shoes in 1 week -Sutures: Removed.  No clinical signs of dehiscence noted.  No complication noted. -Medications: None   No follow-ups on file.

## 2021-01-23 ENCOUNTER — Ambulatory Visit: Payer: Medicare HMO | Admitting: Podiatry

## 2021-02-20 ENCOUNTER — Other Ambulatory Visit: Payer: Self-pay

## 2021-02-20 ENCOUNTER — Encounter (INDEPENDENT_AMBULATORY_CARE_PROVIDER_SITE_OTHER): Payer: Self-pay

## 2021-02-20 ENCOUNTER — Ambulatory Visit (INDEPENDENT_AMBULATORY_CARE_PROVIDER_SITE_OTHER): Payer: Medicare HMO | Admitting: Podiatry

## 2021-02-20 DIAGNOSIS — L97521 Non-pressure chronic ulcer of other part of left foot limited to breakdown of skin: Secondary | ICD-10-CM | POA: Diagnosis not present

## 2021-02-20 DIAGNOSIS — M216X1 Other acquired deformities of right foot: Secondary | ICD-10-CM

## 2021-02-20 DIAGNOSIS — Z9889 Other specified postprocedural states: Secondary | ICD-10-CM

## 2021-02-26 ENCOUNTER — Encounter: Payer: Self-pay | Admitting: Podiatry

## 2021-02-26 NOTE — Progress Notes (Signed)
Subjective:  Patient ID: Lynn Wood, female    DOB: July 24, 1945,  MRN: [id number]  Chief Complaint  Patient presents with   Routine Post Op    PT stated that she is still having pain with her foot and gets shooting pains     DOS: 12/30/2020 Procedure: Right second metatarsal floating osteotomy  75 y.o. female returns for post-op check.  Patient states she is doing well.  No acute complaints.  Bandages clean dry and intact.  Stitches are intact.  Pain is controlled  Review of Systems: Negative except as noted in the HPI. Denies N/V/F/Ch.  Past Medical History:  Diagnosis Date   Arthritis    Benign neoplasm of colon    Breast cancer ([place]) 08/2016   left   Cancer ([place]) 06/2016   left breast   Carpal tunnel syndrome    DCIS (ductal carcinoma in situ) of breast    Diabetes mellitus type 2, uncomplicated (HCC)    Hypertension    Nontoxic uninodular goiter    Pre-diabetes    Pure hypercholesterolemia    Vaginal vault prolapse    Vaginal vault prolapse    Vitamin D deficiency    Vitamin D deficiency     Current Outpatient Medications:    amLODipine (NORVASC) 5 MG tablet, Take 5 mg by mouth daily., Disp: , Rfl:    aspirin EC 81 MG tablet, Take 1 tablet by mouth 1 day or 1 dose., Disp: , Rfl:    atenolol (TENORMIN) 50 MG tablet, Take 1 tablet (50 mg total) by mouth once daily., Disp: , Rfl:    Cholecalciferol (VITAMIN D) 2000 units CAPS, Take 1 capsule by mouth daily., Disp: , Rfl:    clobetasol cream (TEMOVATE) AB-123456789 %, Apply 1 application topically 2 (two) times daily as needed. , Disp: , Rfl:    docusate sodium (COLACE) 100 MG capsule, Take 1 capsule (100 mg total) by mouth 2 (two) times daily., Disp: 10 capsule, Rfl: 0   fluticasone (FLONASE) 50 MCG/ACT nasal spray, Place 1 spray into both nostrils daily as needed for allergies. , Disp: , Rfl:    gabapentin (NEURONTIN) 300 MG capsule, Take by mouth., Disp: , Rfl:    ibuprofen (ADVIL) 800 MG tablet, Take 1 tablet (800  mg total) by mouth every 6 (six) hours as needed., Disp: 60 tablet, Rfl: 1   lisinopril (PRINIVIL,ZESTRIL) 20 MG tablet, Take 1 tablet (20 mg total) by mouth once daily., Disp: , Rfl:    Omega-3 Fatty Acids (FISH OIL PO), Take 1 capsule by mouth 1 day or 1 dose., Disp: , Rfl:    oxyCODONE-acetaminophen (PERCOCET) 5-325 MG tablet, Take 1-2 tablets by mouth every 4 (four) hours as needed for severe pain., Disp: 30 tablet, Rfl: 0   rosuvastatin (CRESTOR) 5 MG tablet, Take by mouth., Disp: , Rfl:   Social History   Tobacco Use  Smoking Status Light Smoker   Packs/day: 0.25   Types: Cigarettes  Smokeless Tobacco Never  Tobacco Comments   1 cigarette every 1-2 weeks    Allergies  Allergen Reactions   Lipitor [Atorvastatin] Other (See Comments)    Arthalgia   Lipofen [Fenofibrate] Other (See Comments)    Arthalgia   Mevacor [Lovastatin] Other (See Comments)    Arthalgia   Zetia [Ezetimibe] Other (See Comments)    Arthalgia   Zocor [Simvastatin] Other (See Comments)    Arthalgia   Objective:   There were no vitals filed for this visit.  There is no  height or weight on file to calculate BMI. Constitutional Well developed. Well nourished.  Vascular Foot warm and well perfused. Capillary refill normal to all digits.   Neurologic Normal speech. Oriented to person, place, and time. Epicritic sensation to light touch grossly present bilaterally.  Dermatologic  Wound Location: Left dorsal forefoot limited to the breakdown of the skin Wound Base: Mixed Granular/Fibrotic Peri-wound: Calloused Exudate: Scant/small amount Serous exudate Wound Measurements: -See below  Orthopedic: Tenderness to palpation noted about the surgical site.   Radiographs: 3 views of skeletally mature the right foot: Good correction alignment noted.  Osteotomy of the second metatarsal noted.  Dorsally translated as expected Assessment:   1. 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 Status post  foot surgery   3. Foot ulcer, limited to breakdown of skin, left [facility])      Plan:  Patient was evaluated and treated and all questions answered.  S/p foot surgery right -Progressing as expected post-operatively. -XR: See above -WB Status: Weightbearing as tolerated in regular shoe -Sutures: Superficial wound formation noted. -Medications: None   Ulcer left dorsal foot superficial wound limited to the breakdown of the skin -Debridement as below. -Dressed with Betadine wet-to-dry, DSD. -Continue off-loading with surgical shoe.  Procedure: Excisional Debridement of Wound Tool: Sharp chisel blade/tissue nipper Rationale: Removal of non-viable soft tissue from the wound to promote healing.  Anesthesia: none Pre-Debridement Wound Measurements: 0.2 cm x 0.2 cm x 0.2 cm  Post-Debridement Wound Measurements: 0.3 cm x 0.3 cm x 0.3 cm  Type of Debridement: Sharp Excisional Tissue Removed: Non-viable soft tissue Blood loss: Minimal (<50cc) Depth of Debridement: subcutaneous tissue. Technique: Sharp excisional debridement to bleeding, viable wound base.  Wound Progress: This is my initial evaluation I will continue to monitor the progression of the wound. Site healing conversation 7 Dressing: Dry, sterile, compression dressing. Disposition: Patient tolerated procedure well. Patient to return in 1 week for follow-up.  No follow-ups on file.       No follow-ups on file.

## 2021-03-14 DIAGNOSIS — E7800 Pure hypercholesterolemia, unspecified: Secondary | ICD-10-CM | POA: Diagnosis not present

## 2021-03-14 DIAGNOSIS — I10 Essential (primary) hypertension: Secondary | ICD-10-CM | POA: Diagnosis not present

## 2021-03-14 DIAGNOSIS — Z1389 Encounter for screening for other disorder: Secondary | ICD-10-CM | POA: Diagnosis not present

## 2021-03-14 DIAGNOSIS — M791 Myalgia, unspecified site: Secondary | ICD-10-CM | POA: Diagnosis not present

## 2021-03-14 DIAGNOSIS — L719 Rosacea, unspecified: Secondary | ICD-10-CM | POA: Diagnosis not present

## 2021-03-14 DIAGNOSIS — T466X5A Adverse effect of antihyperlipidemic and antiarteriosclerotic drugs, initial encounter: Secondary | ICD-10-CM | POA: Diagnosis not present

## 2021-03-14 DIAGNOSIS — E1151 Type 2 diabetes mellitus with diabetic peripheral angiopathy without gangrene: Secondary | ICD-10-CM | POA: Diagnosis not present

## 2021-03-14 DIAGNOSIS — Z0000 Encounter for general adult medical examination without abnormal findings: Secondary | ICD-10-CM | POA: Diagnosis not present

## 2021-03-20 ENCOUNTER — Other Ambulatory Visit: Payer: Self-pay

## 2021-03-20 ENCOUNTER — Ambulatory Visit (INDEPENDENT_AMBULATORY_CARE_PROVIDER_SITE_OTHER): Payer: Medicare HMO | Admitting: Podiatry

## 2021-03-20 DIAGNOSIS — M216X1 Other acquired deformities of right foot: Secondary | ICD-10-CM

## 2021-03-20 DIAGNOSIS — Z9889 Other specified postprocedural states: Secondary | ICD-10-CM

## 2021-03-27 NOTE — Progress Notes (Signed)
Subjective:  Patient ID: Lynn Wood, female    DOB: [birth year],  MRN: [id number]  Chief Complaint  Patient presents with   Routine Post Op    PT stated that she still has some pain on the top of her foot     DOS: 12/30/2020 Procedure: Right second metatarsal floating osteotomy  75 y.o. female returns for post-op check.  Patient states she is doing well.  No acute complaints.  Incision has completely healed very epithelialized.  She states that she has little bit of soreness but overall doing much better.  Review of Systems: Negative except as noted in the HPI. Denies N/V/F/Ch.  Past Medical History:  Diagnosis Date   Arthritis    Benign neoplasm of colon    Breast cancer ([place]) 08/2016   left   Cancer ([place]) 06/2016   left breast   Carpal tunnel syndrome    DCIS (ductal carcinoma in situ) of breast    Diabetes mellitus type 2, uncomplicated (HCC)    Hypertension    Nontoxic uninodular goiter    Pre-diabetes    Pure hypercholesterolemia    Vaginal vault prolapse    Vaginal vault prolapse    Vitamin D deficiency    Vitamin D deficiency     Current Outpatient Medications:    amLODipine (NORVASC) 5 MG tablet, Take 5 mg by mouth daily., Disp: , Rfl:    aspirin EC 81 MG tablet, Take 1 tablet by mouth 1 day or 1 dose., Disp: , Rfl:    atenolol (TENORMIN) 50 MG tablet, Take 1 tablet (50 mg total) by mouth once daily., Disp: , Rfl:    Cholecalciferol (VITAMIN D) 2000 units CAPS, Take 1 capsule by mouth daily., Disp: , Rfl:    clobetasol cream (TEMOVATE) 6.27 %, Apply 1 application topically 2 (two) times daily as needed. , Disp: , Rfl:    docusate sodium (COLACE) 100 MG capsule, Take 1 capsule (100 mg total) by mouth 2 (two) times daily., Disp: 10 capsule, Rfl: 0   fluticasone (FLONASE) 50 MCG/ACT nasal spray, Place 1 spray into both nostrils daily as needed for allergies. , Disp: , Rfl:    gabapentin (NEURONTIN) 300 MG capsule, Take by mouth., Disp: , Rfl:    ibuprofen  (ADVIL) 800 MG tablet, Take 1 tablet (800 mg total) by mouth every 6 (six) hours as needed., Disp: 60 tablet, Rfl: 1   lisinopril (PRINIVIL,ZESTRIL) 20 MG tablet, Take 1 tablet (20 mg total) by mouth once daily., Disp: , Rfl:    Omega-3 Fatty Acids (FISH OIL PO), Take 1 capsule by mouth 1 day or 1 dose., Disp: , Rfl:    oxyCODONE-acetaminophen (PERCOCET) 5-325 MG tablet, Take 1-2 tablets by mouth every 4 (four) hours as needed for severe pain., Disp: 30 tablet, Rfl: 0   rosuvastatin (CRESTOR) 5 MG tablet, Take by mouth., Disp: , Rfl:   Social History   Tobacco Use  Smoking Status Light Smoker   Packs/day: 0.25   Types: Cigarettes  Smokeless Tobacco Never  Tobacco Comments   1 cigarette every 1-2 weeks    Allergies  Allergen Reactions   Lipitor [Atorvastatin] Other (See Comments)    Arthalgia   Lipofen [Fenofibrate] Other (See Comments)    Arthalgia   Mevacor [Lovastatin] Other (See Comments)    Arthalgia   Zetia [Ezetimibe] Other (See Comments)    Arthalgia   Zocor [Simvastatin] Other (See Comments)    Arthalgia   Objective:   There were no vitals filed  for this visit.  There is no height or weight on file to calculate BMI. Constitutional Well developed. Well nourished.  Vascular Foot warm and well perfused. Capillary refill normal to all digits.   Neurologic Normal speech. Oriented to person, place, and time. Epicritic sensation to light touch grossly present bilaterally.  Dermatologic  Wound completely epithelialized.  No complication noted.  Orthopedic: Mild tenderness to palpation noted about the surgical site.   Radiographs: 3 views of skeletally mature the right foot: Good correction alignment noted.  Osteotomy of the second metatarsal noted.  Dorsally translated as expected Assessment:   1. 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 Status post foot surgery       Plan:  Patient was evaluated and treated and all questions answered.  S/p foot  surgery right -Clinically healed and doing well.  I will see her back for the final visit in 6 weeks for final discharge.   Ulcer left dorsal foot superficial wound limited to the breakdown of the skin -Clinically healed.  No further ulceration noted. No follow-ups on file.       No follow-ups on file.

## 2021-07-03 ENCOUNTER — Other Ambulatory Visit: Payer: Self-pay

## 2021-07-03 ENCOUNTER — Ambulatory Visit: Payer: Medicare HMO | Admitting: Podiatry

## 2021-07-03 ENCOUNTER — Encounter: Payer: Self-pay | Admitting: Podiatry

## 2021-07-03 DIAGNOSIS — L989 Disorder of the skin and subcutaneous tissue, unspecified: Secondary | ICD-10-CM

## 2021-07-03 NOTE — Progress Notes (Signed)
Subjective:  Patient ID: Lynn Wood, female    DOB: Nov 03, 1945,  MRN: [id number]  Chief Complaint  Patient presents with   Routine Post Op    76 y.o. female presents with the above complaint.  Patient presents with complaint of left plantar midfoot/forefoot porokeratotic lesion.  Patient states that it is painful to walk on painful to touch.  She states that the right foot from surgery is doing really well.  The left side started hurting.  She is interested in surgery however she is not a surgical candidate because of the location of the lesion.  She would like for me to excise the lesion.  She denies any other acute complaints.   Review of Systems: Negative except as noted in the HPI. Denies N/V/F/Ch.  Past Medical History:  Diagnosis Date   Arthritis    Benign neoplasm of colon    Breast cancer ([place]) 08/2016   left   Cancer ([place]) 06/2016   left breast   Carpal tunnel syndrome    DCIS (ductal carcinoma in situ) of breast    Diabetes mellitus type 2, uncomplicated (HCC)    Hypertension    Nontoxic uninodular goiter    Pre-diabetes    Pure hypercholesterolemia    Vaginal vault prolapse    Vaginal vault prolapse    Vitamin D deficiency    Vitamin D deficiency     Current Outpatient Medications:    amLODipine (NORVASC) 5 MG tablet, Take 5 mg by mouth daily., Disp: , Rfl:    aspirin EC 81 MG tablet, Take 1 tablet by mouth 1 day or 1 dose., Disp: , Rfl:    atenolol (TENORMIN) 50 MG tablet, Take 1 tablet (50 mg total) by mouth once daily., Disp: , Rfl:    Cholecalciferol (VITAMIN D) 2000 units CAPS, Take 1 capsule by mouth daily., Disp: , Rfl:    clobetasol cream (TEMOVATE) 1.19 %, Apply 1 application topically 2 (two) times daily as needed. , Disp: , Rfl:    docusate sodium (COLACE) 100 MG capsule, Take 1 capsule (100 mg total) by mouth 2 (two) times daily., Disp: 10 capsule, Rfl: 0   fluticasone (FLONASE) 50 MCG/ACT nasal spray, Place 1 spray into both nostrils daily  as needed for allergies. , Disp: , Rfl:    gabapentin (NEURONTIN) 300 MG capsule, Take by mouth., Disp: , Rfl:    ibuprofen (ADVIL) 800 MG tablet, Take 1 tablet (800 mg total) by mouth every 6 (six) hours as needed., Disp: 60 tablet, Rfl: 1   lisinopril (PRINIVIL,ZESTRIL) 20 MG tablet, Take 1 tablet (20 mg total) by mouth once daily., Disp: , Rfl:    Omega-3 Fatty Acids (FISH OIL PO), Take 1 capsule by mouth 1 day or 1 dose., Disp: , Rfl:    oxyCODONE-acetaminophen (PERCOCET) 5-325 MG tablet, Take 1-2 tablets by mouth every 4 (four) hours as needed for severe pain., Disp: 30 tablet, Rfl: 0   rosuvastatin (CRESTOR) 5 MG tablet, Take by mouth., Disp: , Rfl:   Social History   Tobacco Use  Smoking Status Light Smoker   Packs/day: 0.25   Types: Cigarettes  Smokeless Tobacco Never  Tobacco Comments   1 cigarette every 1-2 weeks    Allergies  Allergen Reactions   Lipitor [Atorvastatin] Other (See Comments)    Arthalgia   Lipofen [Fenofibrate] Other (See Comments)    Arthalgia   Mevacor [Lovastatin] Other (See Comments)    Arthalgia   Zetia [Ezetimibe] Other (See Comments)  Arthalgia   Zocor [Simvastatin] Other (See Comments)    Arthalgia   Objective:  There were no vitals filed for this visit. There is no height or weight on file to calculate BMI. Constitutional Well developed. Well nourished.  Vascular Dorsalis pedis pulses palpable bilaterally. Posterior tibial pulses palpable bilaterally. Capillary refill normal to all digits.  No cyanosis or clubbing noted. Pedal hair growth normal.  Neurologic Normal speech. Oriented to person, place, and time. Epicritic sensation to light touch grossly present bilaterally.  Dermatologic Hyperkeratotic lesion with central nucleated core noted to the left plantar midfoot.  Pain on palpation to the lesion.  No pinpoint bleeding noted upon debridement.  Orthopedic: Normal joint ROM without pain or crepitus bilaterally. No visible  deformities. No bony tenderness.   Radiographs: None Assessment:   1. Benign skin lesion    Plan:  Patient was evaluated and treated and all questions answered.  Left plantar midfoot porokeratosis/benign skin lesion -All questions and concerns were discussed. -Given the nature of the lesion I believe patient will benefit from punch excision of the lesion.  I am hopeful that this will hopefully dissipate the entire porokeratotic lesion. Excision of the skin lesion Skin was prepped in standard technique with Betadine.  One-to-one mixture of 1% lidocaine plain half percent Marcaine plain was injected circumferentially in V-block fashion 3 cc.  3 mm punch biopsy was utilized to remove the lesion in its entirety down to the level of the subcutaneous tissue.  The wound site was dressed with triple antibiotic 4 x 4 gauze Kerlix and Ace bandage.  I have asked the patient to keep it covered with Neosporin and a Band-Aid until healing.  I will see her back again in 6 weeks   No follow-ups on file.

## 2021-07-25 DIAGNOSIS — M170 Bilateral primary osteoarthritis of knee: Secondary | ICD-10-CM | POA: Diagnosis not present

## 2021-08-14 ENCOUNTER — Ambulatory Visit: Payer: Medicare HMO | Admitting: Podiatry

## 2021-08-14 ENCOUNTER — Other Ambulatory Visit: Payer: Self-pay

## 2021-08-14 DIAGNOSIS — Q828 Other specified congenital malformations of skin: Secondary | ICD-10-CM

## 2021-08-14 NOTE — Progress Notes (Signed)
?Subjective:  ?Patient ID: Lynn Wood, female    DOB: April 20, 1946,  MRN: [id number] ? ?Chief Complaint  ?Patient presents with  ? Callouses  ?  Bilateral   ? ? ?76 y.o. female presents with the above complaint.  Patient presents with follow-up of left plantar midfoot forefoot porokeratotic lesion.  She states it came back is causing her more pain.  She would like to have it removed.  For now she was happy with debridement she will think about all the procedures. ? ? ?Review of Systems: Negative except as noted in the HPI. Denies N/V/F/Ch. ? ?Past Medical History:  ?Diagnosis Date  ? Arthritis   ? Benign neoplasm of colon   ? Breast cancer ([place]) 08/2016  ? left  ? Cancer ([place]) 06/2016  ? left breast  ? Carpal tunnel syndrome   ? DCIS (ductal carcinoma in situ) of breast   ? Diabetes mellitus type 2, uncomplicated ([place])   ? Hypertension   ? Nontoxic uninodular goiter   ? Pre-diabetes   ? Pure hypercholesterolemia   ? Vaginal vault prolapse   ? Vaginal vault prolapse   ? Vitamin D deficiency   ? Vitamin D deficiency   ? ? ?Current Outpatient Medications:  ?  amLODipine (NORVASC) 5 MG tablet, Take 5 mg by mouth daily., Disp: , Rfl:  ?  aspirin EC 81 MG tablet, Take 1 tablet by mouth 1 day or 1 dose., Disp: , Rfl:  ?  atenolol (TENORMIN) 50 MG tablet, Take 1 tablet (50 mg total) by mouth once daily., Disp: , Rfl:  ?  Cholecalciferol (VITAMIN D) 2000 units CAPS, Take 1 capsule by mouth daily., Disp: , Rfl:  ?  clobetasol cream (TEMOVATE) 5.63 %, Apply 1 application topically 2 (two) times daily as needed. , Disp: , Rfl:  ?  docusate sodium (COLACE) 100 MG capsule, Take 1 capsule (100 mg total) by mouth 2 (two) times daily., Disp: 10 capsule, Rfl: 0 ?  fluticasone (FLONASE) 50 MCG/ACT nasal spray, Place 1 spray into both nostrils daily as needed for allergies. , Disp: , Rfl:  ?  gabapentin (NEURONTIN) 300 MG capsule, Take by mouth., Disp: , Rfl:  ?  ibuprofen (ADVIL) 800 MG tablet, Take 1 tablet (800 mg total) by  mouth every 6 (six) hours as needed., Disp: 60 tablet, Rfl: 1 ?  lisinopril (PRINIVIL,ZESTRIL) 20 MG tablet, Take 1 tablet (20 mg total) by mouth once daily., Disp: , Rfl:  ?  Omega-3 Fatty Acids (FISH OIL PO), Take 1 capsule by mouth 1 day or 1 dose., Disp: , Rfl:  ?  oxyCODONE-acetaminophen (PERCOCET) 5-325 MG tablet, Take 1-2 tablets by mouth every 4 (four) hours as needed for severe pain., Disp: 30 tablet, Rfl: 0 ?  rosuvastatin (CRESTOR) 5 MG tablet, Take by mouth., Disp: , Rfl:  ? ?Social History  ? ?Tobacco Use  ?Smoking Status Light Smoker  ? Packs/day: 0.25  ? Types: Cigarettes  ?Smokeless Tobacco Never  ?Tobacco Comments  ? 1 cigarette every 1-2 weeks  ? ? ?Allergies  ?Allergen Reactions  ? Lipitor [Atorvastatin] Other (See Comments)  ?  Arthalgia  ? Lipofen [Fenofibrate] Other (See Comments)  ?  Arthalgia  ? Mevacor [Lovastatin] Other (See Comments)  ?  Arthalgia  ? Zetia [Ezetimibe] Other (See Comments)  ?  Arthalgia  ? Zocor [Simvastatin] Other (See Comments)  ?  Arthalgia  ? ?Objective:  ?There were no vitals filed for this visit. ?There is no height or weight on  file to calculate BMI. ?Constitutional Well developed. ?Well nourished.  ?Vascular Dorsalis pedis pulses palpable bilaterally. ?Posterior tibial pulses palpable bilaterally. ?Capillary refill normal to all digits.  ?No cyanosis or clubbing noted. ?Pedal hair growth normal.  ?Neurologic Normal speech. ?Oriented to person, place, and time. ?Epicritic sensation to light touch grossly present bilaterally.  ?Dermatologic Hyperkeratotic lesion with central nucleated core noted to the left plantar midfoot.  Pain on palpation to the lesion.  No pinpoint bleeding noted upon debridement.  ?Orthopedic: Normal joint ROM without pain or crepitus bilaterally. ?No visible deformities. ?No bony tenderness.  ? ?Radiographs: None ?Assessment:  ? ?1. Porokeratosis   ? ? ?Plan:  ?Patient was evaluated and treated and all questions answered. ? ?Left plantar  midfoot porokeratosis/benign skin lesion ?-All questions and concerns were discussed. ?-Clinically it appears that the lesion has come back and is causing her more pain.  At this time I debrided the lesion down to healthy striated tissue.  No complication noted.  No bleeding noted.  I discussed with her that we can surgically excise out the tissue as well.  Patient would like to think about the procedure and will get back to me when she is ready to undergo surgery. ? ? ?No follow-ups on file. ?

## 2021-09-24 DIAGNOSIS — I10 Essential (primary) hypertension: Secondary | ICD-10-CM | POA: Diagnosis not present

## 2021-09-24 DIAGNOSIS — T466X5A Adverse effect of antihyperlipidemic and antiarteriosclerotic drugs, initial encounter: Secondary | ICD-10-CM | POA: Diagnosis not present

## 2021-09-24 DIAGNOSIS — L719 Rosacea, unspecified: Secondary | ICD-10-CM | POA: Diagnosis not present

## 2021-09-24 DIAGNOSIS — M791 Myalgia, unspecified site: Secondary | ICD-10-CM | POA: Diagnosis not present

## 2021-09-24 DIAGNOSIS — E7800 Pure hypercholesterolemia, unspecified: Secondary | ICD-10-CM | POA: Diagnosis not present

## 2021-09-24 DIAGNOSIS — E1151 Type 2 diabetes mellitus with diabetic peripheral angiopathy without gangrene: Secondary | ICD-10-CM | POA: Diagnosis not present

## 2021-10-27 ENCOUNTER — Other Ambulatory Visit: Payer: Self-pay | Admitting: Family Medicine

## 2021-10-27 DIAGNOSIS — Z1231 Encounter for screening mammogram for malignant neoplasm of breast: Secondary | ICD-10-CM

## 2021-11-13 ENCOUNTER — Ambulatory Visit
Admission: RE | Admit: 2021-11-13 | Discharge: 2021-11-13 | Disposition: A | Discharge status: Home / Self Care | Payer: Medicare HMO | Source: Ambulatory Visit | Attending: Family Medicine | Admitting: Family Medicine

## 2021-11-13 DIAGNOSIS — Z1231 Encounter for screening mammogram for malignant neoplasm of breast: Secondary | ICD-10-CM | POA: Diagnosis not present

## 2021-11-13 DIAGNOSIS — R922 Inconclusive mammogram: Secondary | ICD-10-CM | POA: Diagnosis not present

## 2021-11-13 DIAGNOSIS — Z9012 Acquired absence of left breast and nipple: Secondary | ICD-10-CM | POA: Diagnosis not present

## 2021-12-11 DIAGNOSIS — M13861 Other specified arthritis, right knee: Secondary | ICD-10-CM | POA: Diagnosis not present

## 2021-12-11 DIAGNOSIS — M13862 Other specified arthritis, left knee: Secondary | ICD-10-CM | POA: Diagnosis not present

## 2021-12-30 DIAGNOSIS — M170 Bilateral primary osteoarthritis of knee: Secondary | ICD-10-CM | POA: Diagnosis not present

## 2022-01-09 ENCOUNTER — Other Ambulatory Visit: Payer: Self-pay | Admitting: Orthopedic Surgery

## 2022-01-09 ENCOUNTER — Encounter: Payer: Self-pay | Admitting: Orthopedic Surgery

## 2022-01-09 DIAGNOSIS — Z01818 Encounter for other preprocedural examination: Secondary | ICD-10-CM

## 2022-01-09 NOTE — H&P (Signed)
Lynn Wood MRN:  [id number] DOB/SEX:  1945-08-23/female  CHIEF COMPLAINT:  Painful right Knee  HISTORY: Patient is a 76 y.o. female presented with a history of pain in the right knee. Onset of symptoms was gradual starting several years ago with gradually worsening course since that time. Prior procedures on the knee include none. Patient has been treated conservatively with over-the-counter NSAIDs and activity modification. Patient currently rates pain in the knee at 10 out of 10 with activity. There is pain at night.  PAST MEDICAL HISTORY: Patient Active Problem List   Diagnosis Date Noted   Pain in limb 04/19/2018   PAD (peripheral artery disease) ([place]) 04/19/2018   Other hyperlipidemia 12/10/2017   Left leg numbness 11/22/2017   DCIS (ductal carcinoma in situ) 07/28/2016   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   Primary osteoarthritis of left knee 11/05/2015   Diabetes mellitus type 2, uncomplicated ([place]) 17/00/1749   Vitamin D deficiency 01/02/2014   Nontoxic uninodular goiter 10/31/2013   Benign neoplasm of colon 08/30/2013   Carpal tunnel syndrome 08/30/2013   Benign essential hypertension 08/30/2013   Pure hypercholesterolemia 08/30/2013   Vaginal vault prolapse 08/30/2013   Past Medical History:  Diagnosis Date   Arthritis    Benign neoplasm of colon    Breast cancer ([place]) 08/2016   left   Cancer ([place]) 06/2016   left breast   Carpal tunnel syndrome    DCIS (ductal carcinoma in situ) of breast    Diabetes mellitus type 2, uncomplicated ([facility])    Hypertension    Nontoxic uninodular goiter    Pre-diabetes    Pure hypercholesterolemia    Vaginal vault prolapse    Vaginal vault prolapse    Vitamin D deficiency    Vitamin D deficiency    Past Surgical History:  Procedure Laterality Date   COLONOSCOPY WITH PROPOFOL N/A 01/22/2015   Procedure: COLONOSCOPY WITH PROPOFOL;  Surgeon: Lollie Sails, MD;  Location: [facility] ENDOSCOPY;  Service:  Endoscopy;  Laterality: N/A;   COLONOSCOPY WITH PROPOFOL N/A 05/07/2020   Procedure: COLONOSCOPY WITH PROPOFOL;  Surgeon: Lesly Rubenstein, MD;  Location: [facility] ENDOSCOPY;  Service: Endoscopy;  Laterality: N/A;   GYNECOLOGIC CRYOSURGERY     INCISION AND DRAINAGE ABSCESS Left 07/29/2016   Procedure: Reexploration of left breast mastectomy site;  Surgeon: Jules Husbands, MD;  Location: [facility] ORS;  Service: General;  Laterality: Left;   LOWER EXTREMITY ANGIOGRAPHY Left 05/19/2018   Procedure: LOWER EXTREMITY ANGIOGRAPHY;  Surgeon: Algernon Huxley, MD;  Location: [facility] CV LAB;  Service: Cardiovascular;  Laterality: Left;   MASTECTOMY Left    March 2018   MASTECTOMY W/ SENTINEL NODE BIOPSY Left 07/28/2016   Procedure: MASTECTOMY WITH SENTINEL LYMPH NODE BIOPSY;  Surgeon: Jules Husbands, MD;  Location: [facility] ORS;  Service: General;  Laterality: Left;   MULTIPLE TOOTH EXTRACTIONS  1997     MEDICATIONS:  (Not in a hospital admission)   ALLERGIES:   Allergies  Allergen Reactions   Lipitor [Atorvastatin] Other (See Comments)    Arthalgia   Lipofen [Fenofibrate] Other (See Comments)    Arthalgia   Mevacor [Lovastatin] Other (See Comments)    Arthalgia   Zetia [Ezetimibe] Other (See Comments)    Arthalgia   Zocor [Simvastatin] Other (See Comments)    Arthalgia    REVIEW OF SYSTEMS:  Pertinent items noted in HPI and remainder of comprehensive ROS otherwise negative.   FAMILY HISTORY:   Family History  Problem  Relation Age of Onset   Breast cancer Cousin        mat cousin   Colon cancer Mother    Prostate cancer Father    Prostate cancer Brother    Heart disease Daughter     SOCIAL HISTORY:   Social History   Tobacco Use   Smoking status: Light Smoker    Packs/day: 0.25    Types: Cigarettes   Smokeless tobacco: Never   Tobacco comments:    1 cigarette every 1-2 weeks  Substance Use Topics   Alcohol use: No     EXAMINATION:  Vital signs in last 24  hours: '@VSRANGES'$ @  General appearance: alert, cooperative, and no distress Neck: no JVD and supple, symmetrical, trachea midline Lungs: clear to auscultation bilaterally Heart: regular rate and rhythm, S1, S2 normal, no murmur, click, rub or gallop Abdomen: soft, non-tender; bowel sounds normal; no masses,  no organomegaly Extremities: extremities normal, atraumatic, no cyanosis or edema and Homans sign is negative, no sign of DVT Pulses: 2+ and symmetric Skin: Skin color, texture, turgor normal. No rashes or lesions  Musculoskeletal:  ROM 0-100, Ligaments intact,  Imaging Review Plain radiographs demonstrate severe degenerative joint disease of the right knee. The overall alignment is significant varus. The bone quality appears to be good for age and reported activity level.  Assessment/Plan: Primary osteoarthritis, right knee   The patient history, physical examination and imaging studies are consistent with advanced degenerative joint disease of the right knee. The patient has failed conservative treatment.  The clearance notes were reviewed.  After discussion with the patient it was felt that Total Knee Replacement was indicated. The procedure,  risks, and benefits of total knee arthroplasty were presented and reviewed. The risks including but not limited to aseptic loosening, infection, blood clots, vascular injury, stiffness, patella tracking problems complications among others were discussed. The patient acknowledged the explanation, agreed to proceed with the plan.  Carlynn Spry 01/09/2022, 12:46 PM

## 2022-01-29 ENCOUNTER — Encounter
Admission: RE | Admit: 2022-01-29 | Discharge: 2022-01-29 | Disposition: A | Discharge status: Home / Self Care | Payer: Medicare HMO | Source: Ambulatory Visit | Attending: Orthopedic Surgery | Admitting: Orthopedic Surgery

## 2022-01-29 VITALS — Ht 64.5 in | Wt 160.0 lb

## 2022-01-29 DIAGNOSIS — E7849 Other hyperlipidemia: Secondary | ICD-10-CM

## 2022-01-29 DIAGNOSIS — E119 Type 2 diabetes mellitus without complications: Secondary | ICD-10-CM

## 2022-01-29 DIAGNOSIS — Z01818 Encounter for other preprocedural examination: Secondary | ICD-10-CM

## 2022-01-29 NOTE — Patient Instructions (Addendum)
Your procedure is scheduled on: 02/10/22 Report to [facility]. To find out your arrival time please call [phone] between 1PM - 3PM on 02/09/22.  Remember: Instructions that are not followed completely may result in serious medical risk, up to and including death, or upon the discretion of your surgeon and anesthesiologist your surgery may need to be rescheduled.     _X__ 1. Do not eat food after midnight the night before your procedure.                 No gum chewing or hard candies.   __X__2.  On the morning of surgery brush your teeth with toothpaste and water, you                 may rinse your mouth with mouthwash if you wish.  Do not swallow any              toothpaste of mouthwash.     _X__ 3.  No Alcohol for 24 hours before or after surgery.   _X__ 4.  Do Not Smoke or use e-cigarettes For 24 Hours Prior to Your Surgery.                 Do not use any chewable tobacco products for at least 6 hours prior to                 surgery.  ____  5.  Bring all medications with you on the day of surgery if instructed.   __X__  6.  Notify your doctor if there is any change in your medical condition      (cold, fever, infections).     Do not wear jewelry, make-up, hairpins, clips or nail polish. Do not wear lotions, powders, or perfumes.  Do not shave body hair 48 hours prior to surgery. Men may shave face and neck. Do not bring valuables to the hospital.    [facility] is not responsible for any belongings or valuables.  Contacts, dentures/partials or body piercings may not be worn into surgery. Bring a case for your contacts, glasses or hearing aids, a denture cup will be supplied. Leave your suitcase in the car. After surgery it may be brought to your room. For patients admitted to the hospital, discharge time is determined by your treatment team.   Patients discharged the day of surgery will not be allowed to drive  home.   Please read over the following fact sheets that you were given:   MRSA Information, chg soap, incentive spirometer  __X__ Take these medicines the morning of surgery with A SIP OF WATER:    1. none  2.   3.   4.  5.  6.  ____ Fleet Enema (as directed)   __X__ Use CHG Soap/SAGE wipes as directed  ____ Use inhalers on the day of surgery  ____ Stop metformin/Janumet/Farxiga 2 days prior to surgery    ____ Take 1/2 of usual insulin dose the night before surgery. No insulin the morning          of surgery.   ____ Stop Blood Thinners Coumadin/Plavix/Xarelto/Pleta/Pradaxa/Eliquis/Effient/Aspirin  on   Or contact your Surgeon, Cardiologist or Medical Doctor regarding  ability to stop your blood thinners  __X__ Stop Anti-inflammatories 7 days before surgery such as Advil, Ibuprofen, Motrin,  BC or Goodies Powder, Naprosyn, Naproxen, Aleve, Aspirin    __X__ Stop all herbal supplements, fish oil or  vitamins for 7 days until after surgery.    ____ Bring C-Pap to the hospital.

## 2022-02-02 ENCOUNTER — Encounter: Payer: Self-pay | Admitting: Urgent Care

## 2022-02-02 ENCOUNTER — Other Ambulatory Visit
Admission: RE | Admit: 2022-02-02 | Discharge: 2022-02-02 | Disposition: A | Discharge status: Home / Self Care | Payer: Medicare HMO | Source: Ambulatory Visit | Attending: Orthopedic Surgery | Admitting: Orthopedic Surgery

## 2022-02-02 DIAGNOSIS — E7849 Other hyperlipidemia: Secondary | ICD-10-CM | POA: Diagnosis not present

## 2022-02-02 DIAGNOSIS — Z01818 Encounter for other preprocedural examination: Secondary | ICD-10-CM | POA: Diagnosis not present

## 2022-02-02 DIAGNOSIS — E119 Type 2 diabetes mellitus without complications: Secondary | ICD-10-CM | POA: Diagnosis not present

## 2022-02-02 LAB — CBC
HCT: 37.9 % (ref 36.0–46.0)
Hemoglobin: 12.3 g/dL (ref 12.0–15.0)
MCH: 27.8 pg (ref 26.0–34.0)
MCHC: 32.5 g/dL (ref 30.0–36.0)
MCV: 85.6 fL (ref 80.0–100.0)
Platelets: 362 10*3/uL (ref 150–400)
RBC: 4.43 MIL/uL (ref 3.87–5.11)
RDW: 13.3 % (ref 11.5–15.5)
WBC: 5.8 10*3/uL (ref 4.0–10.5)
nRBC: 0 % (ref 0.0–0.2)

## 2022-02-02 LAB — URINALYSIS, ROUTINE W REFLEX MICROSCOPIC
Bilirubin Urine: NEGATIVE
Glucose, UA: NEGATIVE mg/dL
Ketones, ur: NEGATIVE mg/dL
Nitrite: NEGATIVE
Protein, ur: NEGATIVE mg/dL
Specific Gravity, Urine: 1.004 — ABNORMAL LOW (ref 1.005–1.030)
WBC, UA: >50 WBC/hpf — ABNORMAL HIGH (ref 0–5)
pH: 6 (ref 5.0–8.0)

## 2022-02-02 LAB — TYPE AND SCREEN
ABO/RH(D): O POS
Antibody Screen: NEGATIVE

## 2022-02-02 LAB — BASIC METABOLIC PANEL
Anion gap: 5 (ref 5–15)
BUN: 15 mg/dL (ref 8–23)
CO2: 24 mmol/L (ref 22–32)
Calcium: 9.2 mg/dL (ref 8.9–10.3)
Chloride: 111 mmol/L (ref 98–111)
Creatinine, Ser: 0.72 mg/dL (ref 0.44–1.00)
GFR, Estimated: >60 mL/min (ref >60)
Glucose, Bld: 93 mg/dL (ref 70–99)
Potassium: 3.8 mmol/L (ref 3.5–5.1)
Sodium: 140 mmol/L (ref 135–145)

## 2022-02-02 LAB — SURGICAL PCR SCREEN
MRSA, PCR: NEGATIVE
Staphylococcus aureus: NEGATIVE

## 2022-02-03 LAB — URINE CULTURE

## 2022-02-05 ENCOUNTER — Encounter: Payer: Self-pay | Admitting: Urgent Care

## 2022-02-06 DIAGNOSIS — M1711 Unilateral primary osteoarthritis, right knee: Secondary | ICD-10-CM | POA: Diagnosis not present

## 2022-02-09 MED ORDER — LACTATED RINGERS IV SOLN: INTRAVENOUS | Status: DC

## 2022-02-09 MED ORDER — TRANEXAMIC ACID-NACL 1000-0.7 MG/100ML-% IV SOLN
1000.0000 mg | INTRAVENOUS | Status: AC
  Administered 2022-02-10: 1000 mg via INTRAVENOUS

## 2022-02-09 MED ORDER — FAMOTIDINE 20 MG PO TABS: 20.0000 mg | ORAL_TABLET | Freq: Once | ORAL | Status: AC

## 2022-02-09 MED ORDER — POVIDONE-IODINE 10 % EX SWAB
2.0000 | Freq: Once | CUTANEOUS | Status: AC
  Administered 2022-02-10: 2 via TOPICAL

## 2022-02-09 MED ORDER — CEFAZOLIN SODIUM-DEXTROSE 2-4 GM/100ML-% IV SOLN
2.0000 g | INTRAVENOUS | Status: AC
  Administered 2022-02-10: 2 g via INTRAVENOUS

## 2022-02-09 MED ORDER — CHLORHEXIDINE GLUCONATE 0.12 % MT SOLN: 15.0000 mL | Freq: Once | OROMUCOSAL | Status: AC

## 2022-02-09 MED ORDER — ORAL CARE MOUTH RINSE: 15.0000 mL | Freq: Once | OROMUCOSAL | Status: AC

## 2022-02-10 ENCOUNTER — Ambulatory Visit: Payer: Medicare HMO

## 2022-02-10 ENCOUNTER — Encounter
Admission: RE | Disposition: A | Discharge status: Home Health | Payer: Self-pay | Source: Home / Self Care | Attending: Orthopedic Surgery

## 2022-02-10 ENCOUNTER — Other Ambulatory Visit: Payer: Self-pay

## 2022-02-10 ENCOUNTER — Observation Stay
Admission: RE | Admit: 2022-02-10 | Discharge: 2022-02-11 | Disposition: A | Discharge status: Home Health | Payer: Medicare HMO | Attending: Orthopedic Surgery | Admitting: Orthopedic Surgery

## 2022-02-10 ENCOUNTER — Encounter: Payer: Self-pay | Admitting: Orthopedic Surgery

## 2022-02-10 DIAGNOSIS — M1711 Unilateral primary osteoarthritis, right knee: Principal | ICD-10-CM | POA: Insufficient documentation

## 2022-02-10 DIAGNOSIS — Z853 Personal history of malignant neoplasm of breast: Secondary | ICD-10-CM | POA: Insufficient documentation

## 2022-02-10 DIAGNOSIS — Z96651 Presence of right artificial knee joint: Secondary | ICD-10-CM

## 2022-02-10 DIAGNOSIS — Z01818 Encounter for other preprocedural examination: Secondary | ICD-10-CM

## 2022-02-10 DIAGNOSIS — I10 Essential (primary) hypertension: Secondary | ICD-10-CM | POA: Insufficient documentation

## 2022-02-10 DIAGNOSIS — E119 Type 2 diabetes mellitus without complications: Secondary | ICD-10-CM | POA: Insufficient documentation

## 2022-02-10 DIAGNOSIS — G8918 Other acute postprocedural pain: Secondary | ICD-10-CM | POA: Diagnosis not present

## 2022-02-10 HISTORY — PX: TOTAL KNEE ARTHROPLASTY: SHX125

## 2022-02-10 LAB — ABO/RH: ABO/RH(D): O POS

## 2022-02-10 LAB — GLUCOSE, CAPILLARY
Glucose-Capillary: 95 mg/dL (ref 70–99)
Glucose-Capillary: 91 mg/dL (ref 70–99)

## 2022-02-10 SURGERY — ARTHROPLASTY, KNEE, TOTAL
Anesthesia: Regional | Site: Knee | Laterality: Right

## 2022-02-10 MED ORDER — FENTANYL CITRATE (PF) 100 MCG/2ML IJ SOLN
INTRAMUSCULAR | Status: DC | PRN
Start: 2022-02-10 — End: 2022-02-10
  Administered 2022-02-10 (×2): 50 ug via INTRAVENOUS

## 2022-02-10 MED ORDER — MORPHINE SULFATE (PF) 4 MG/ML IV SOLN
0.5000 mg | INTRAVENOUS | Status: DC | PRN
  Administered 2022-02-11 (×2): 0.52 mg via INTRAVENOUS
  Filled 2022-02-10 (×2): qty 1

## 2022-02-10 MED ORDER — LISINOPRIL 20 MG PO TABS
20.0000 mg | ORAL_TABLET | Freq: Every day | ORAL | Status: DC
  Administered 2022-02-10: 20 mg via ORAL
  Filled 2022-02-10: qty 1

## 2022-02-10 MED ORDER — SODIUM CHLORIDE 0.9 % IR SOLN
Status: DC | PRN
  Administered 2022-02-10: 3000 mL

## 2022-02-10 MED ORDER — SODIUM CHLORIDE (PF) 0.9 % IJ SOLN
INTRAMUSCULAR | Status: DC | PRN
  Administered 2022-02-10: 90 mL via INTRAMUSCULAR

## 2022-02-10 MED ORDER — FENTANYL CITRATE (PF) 100 MCG/2ML IJ SOLN
INTRAMUSCULAR | Status: AC
  Filled 2022-02-10: qty 2

## 2022-02-10 MED ORDER — CHLORHEXIDINE GLUCONATE 0.12 % MT SOLN
OROMUCOSAL | Status: AC
  Administered 2022-02-10: 15 mL via OROMUCOSAL
  Filled 2022-02-10: qty 15

## 2022-02-10 MED ORDER — DOCUSATE SODIUM 100 MG PO CAPS
100.0000 mg | ORAL_CAPSULE | Freq: Two times a day (BID) | ORAL | Status: DC
  Administered 2022-02-10 – 2022-02-11 (×2): 100 mg via ORAL
  Filled 2022-02-10 (×2): qty 1

## 2022-02-10 MED ORDER — FAMOTIDINE 20 MG PO TABS
ORAL_TABLET | ORAL | Status: AC
  Administered 2022-02-10: 20 mg via ORAL
  Filled 2022-02-10: qty 1

## 2022-02-10 MED ORDER — ALUM & MAG HYDROXIDE-SIMETH 200-200-20 MG/5ML PO SUSP: 30.0000 mL | ORAL | Status: DC | PRN

## 2022-02-10 MED ORDER — BUPIVACAINE-EPINEPHRINE (PF) 0.5% -1:200000 IJ SOLN
INTRAMUSCULAR | Status: AC
  Filled 2022-02-10: qty 30

## 2022-02-10 MED ORDER — MIDAZOLAM HCL 2 MG/2ML IJ SOLN
INTRAMUSCULAR | Status: AC
  Filled 2022-02-10: qty 2

## 2022-02-10 MED ORDER — LACTATED RINGERS IV SOLN: INTRAVENOUS | Status: DC

## 2022-02-10 MED ORDER — OXYCODONE HCL 5 MG/5ML PO SOLN: 5.0000 mg | Freq: Once | ORAL | Status: DC | PRN

## 2022-02-10 MED ORDER — PROPOFOL 500 MG/50ML IV EMUL
INTRAVENOUS | Status: DC | PRN
  Administered 2022-02-10: 75 ug/kg/min via INTRAVENOUS

## 2022-02-10 MED ORDER — KETOROLAC TROMETHAMINE 15 MG/ML IJ SOLN
INTRAMUSCULAR | Status: AC
  Filled 2022-02-10: qty 1

## 2022-02-10 MED ORDER — PHENYLEPHRINE HCL-NACL 20-0.9 MG/250ML-% IV SOLN
INTRAVENOUS | Status: DC | PRN
  Administered 2022-02-10: 30 ug/min via INTRAVENOUS

## 2022-02-10 MED ORDER — ASPIRIN 81 MG PO CHEW
81.0000 mg | CHEWABLE_TABLET | Freq: Two times a day (BID) | ORAL | Status: DC
  Administered 2022-02-10 – 2022-02-11 (×2): 81 mg via ORAL
  Filled 2022-02-10 (×2): qty 1

## 2022-02-10 MED ORDER — DIPHENHYDRAMINE HCL 12.5 MG/5ML PO ELIX: 12.5000 mg | ORAL_SOLUTION | ORAL | Status: DC | PRN

## 2022-02-10 MED ORDER — ATENOLOL 50 MG PO TABS
50.0000 mg | ORAL_TABLET | Freq: Every day | ORAL | Status: DC
  Administered 2022-02-10: 50 mg via ORAL
  Filled 2022-02-10: qty 1

## 2022-02-10 MED ORDER — OXYCODONE HCL 5 MG PO TABS: 5.0000 mg | ORAL_TABLET | Freq: Once | ORAL | Status: DC | PRN

## 2022-02-10 MED ORDER — ONDANSETRON HCL 4 MG PO TABS: 4.0000 mg | ORAL_TABLET | Freq: Four times a day (QID) | ORAL | Status: DC | PRN

## 2022-02-10 MED ORDER — FENTANYL CITRATE PF 50 MCG/ML IJ SOSY
PREFILLED_SYRINGE | INTRAMUSCULAR | Status: AC
  Administered 2022-02-10: 50 ug via INTRAVENOUS
  Filled 2022-02-10: qty 1

## 2022-02-10 MED ORDER — PROPOFOL 1000 MG/100ML IV EMUL
INTRAVENOUS | Status: AC
  Filled 2022-02-10: qty 100

## 2022-02-10 MED ORDER — FENTANYL CITRATE PF 50 MCG/ML IJ SOSY: 50.0000 ug | PREFILLED_SYRINGE | Freq: Once | INTRAMUSCULAR | Status: AC

## 2022-02-10 MED ORDER — KETOROLAC TROMETHAMINE 15 MG/ML IJ SOLN
7.5000 mg | Freq: Four times a day (QID) | INTRAMUSCULAR | Status: AC
  Administered 2022-02-10 – 2022-02-11 (×4): 7.5 mg via INTRAVENOUS
  Filled 2022-02-10 (×3): qty 1

## 2022-02-10 MED ORDER — HYDROCODONE-ACETAMINOPHEN 5-325 MG PO TABS
1.0000 | ORAL_TABLET | ORAL | Status: DC | PRN
  Administered 2022-02-11 (×2): 2 via ORAL
  Filled 2022-02-10 (×2): qty 2

## 2022-02-10 MED ORDER — ACETAMINOPHEN 10 MG/ML IV SOLN
INTRAVENOUS | Status: AC
  Filled 2022-02-10: qty 100

## 2022-02-10 MED ORDER — ONDANSETRON HCL 4 MG/2ML IJ SOLN
4.0000 mg | Freq: Once | INTRAMUSCULAR | Status: DC | PRN
Start: 2022-02-10 — End: 2022-02-10

## 2022-02-10 MED ORDER — MENTHOL 3 MG MT LOZG: 1.0000 | LOZENGE | OROMUCOSAL | Status: DC | PRN

## 2022-02-10 MED ORDER — ONDANSETRON HCL 4 MG/2ML IJ SOLN: 4.0000 mg | Freq: Four times a day (QID) | INTRAMUSCULAR | Status: DC | PRN

## 2022-02-10 MED ORDER — SURGIRINSE WOUND IRRIGATION SYSTEM - OPTIME
TOPICAL | Status: DC | PRN
  Administered 2022-02-10: 450 mL via TOPICAL

## 2022-02-10 MED ORDER — METOCLOPRAMIDE HCL 5 MG/ML IJ SOLN: 5.0000 mg | Freq: Three times a day (TID) | INTRAMUSCULAR | Status: DC | PRN

## 2022-02-10 MED ORDER — HYDROCODONE-ACETAMINOPHEN 7.5-325 MG PO TABS: 1.0000 | ORAL_TABLET | ORAL | Status: DC | PRN

## 2022-02-10 MED ORDER — MAGNESIUM CITRATE PO SOLN
1.0000 | Freq: Once | ORAL | Status: DC | PRN
Start: 2022-02-10 — End: 2022-02-11

## 2022-02-10 MED ORDER — SODIUM CHLORIDE FLUSH 0.9 % IV SOLN
INTRAVENOUS | Status: AC
  Filled 2022-02-10: qty 40

## 2022-02-10 MED ORDER — PHENYLEPHRINE HCL-NACL 20-0.9 MG/250ML-% IV SOLN
INTRAVENOUS | Status: AC
  Filled 2022-02-10: qty 250

## 2022-02-10 MED ORDER — PHENOL 1.4 % MT LIQD: 1.0000 | OROMUCOSAL | Status: DC | PRN

## 2022-02-10 MED ORDER — BUPIVACAINE HCL (PF) 0.5 % IJ SOLN
INTRAMUSCULAR | Status: DC | PRN
  Administered 2022-02-10: 2.4 mL
  Administered 2022-02-10: 20 mL

## 2022-02-10 MED ORDER — ACETAMINOPHEN 10 MG/ML IV SOLN: 1000.0000 mg | Freq: Once | INTRAVENOUS | Status: DC | PRN

## 2022-02-10 MED ORDER — ACETAMINOPHEN 10 MG/ML IV SOLN
INTRAVENOUS | Status: DC | PRN
  Administered 2022-02-10: 1000 mg via INTRAVENOUS

## 2022-02-10 MED ORDER — LACTATED RINGERS IV SOLN
INTRAVENOUS | Status: DC
Start: 2022-02-10 — End: 2022-02-10

## 2022-02-10 MED ORDER — ATENOLOL 50 MG PO TABS
50.0000 mg | ORAL_TABLET | Freq: Once | ORAL | Status: AC
  Administered 2022-02-10: 50 mg via ORAL
  Filled 2022-02-10: qty 1

## 2022-02-10 MED ORDER — METOCLOPRAMIDE HCL 5 MG PO TABS: 5.0000 mg | ORAL_TABLET | Freq: Three times a day (TID) | ORAL | Status: DC | PRN

## 2022-02-10 MED ORDER — FENTANYL CITRATE (PF) 100 MCG/2ML IJ SOLN: 25.0000 ug | INTRAMUSCULAR | Status: DC | PRN

## 2022-02-10 MED ORDER — ACETAMINOPHEN 325 MG PO TABS: 325.0000 mg | ORAL_TABLET | Freq: Four times a day (QID) | ORAL | Status: DC | PRN

## 2022-02-10 MED ORDER — CEFAZOLIN SODIUM-DEXTROSE 1-4 GM/50ML-% IV SOLN
1.0000 g | Freq: Four times a day (QID) | INTRAVENOUS | Status: AC
  Administered 2022-02-10 – 2022-02-11 (×2): 1 g via INTRAVENOUS
  Filled 2022-02-10 (×2): qty 50

## 2022-02-10 MED ORDER — BUPIVACAINE HCL (PF) 0.5 % IJ SOLN
INTRAMUSCULAR | Status: AC
  Filled 2022-02-10: qty 20

## 2022-02-10 MED ORDER — TRANEXAMIC ACID-NACL 1000-0.7 MG/100ML-% IV SOLN
INTRAVENOUS | Status: AC
  Filled 2022-02-10: qty 100

## 2022-02-10 MED ORDER — CEFAZOLIN SODIUM-DEXTROSE 2-4 GM/100ML-% IV SOLN
INTRAVENOUS | Status: AC
  Filled 2022-02-10: qty 100

## 2022-02-10 MED ORDER — BUPIVACAINE LIPOSOME 1.3 % IJ SUSP
INTRAMUSCULAR | Status: AC
  Filled 2022-02-10: qty 20

## 2022-02-10 MED ORDER — PROPOFOL 10 MG/ML IV BOLUS
INTRAVENOUS | Status: DC | PRN
  Administered 2022-02-10: 20 mg via INTRAVENOUS

## 2022-02-10 MED ORDER — MIDAZOLAM HCL 5 MG/5ML IJ SOLN
INTRAMUSCULAR | Status: DC | PRN
  Administered 2022-02-10: 1 mg via INTRAVENOUS

## 2022-02-10 MED ORDER — BISACODYL 10 MG RE SUPP: 10.0000 mg | Freq: Every day | RECTAL | Status: DC | PRN

## 2022-02-10 SURGICAL SUPPLY — 61 items
BASEPLATE TIBIAL SZ 3 (Knees) IMPLANT
BLADE BOVIE TIP EXT 4 (BLADE) IMPLANT
BLADE SAGITTAL AGGR TOOTH XLG (BLADE) ×1 IMPLANT
BLADE SAW SAG 25X90X1.19 (BLADE) ×1 IMPLANT
BOWL CEMENT MIX W/ADAPTER (MISCELLANEOUS) ×1 IMPLANT
BRUSH SCRUB EZ  4% CHG (MISCELLANEOUS) ×1
BRUSH SCRUB EZ 4% CHG (MISCELLANEOUS) ×1 IMPLANT
CEMENT BONE 40GM (Cement) ×2 IMPLANT
CHLORAPREP W/TINT 26 (MISCELLANEOUS) ×2 IMPLANT
COMP PATELLA GENESIS 35MM (Stem) ×1 IMPLANT
COMPONENT FEM OXINIUM RT SZ4 (Knees) IMPLANT
COMPONENT PATELLA GENESIS 35MM (Stem) IMPLANT
COOLER POLAR GLACIER W/PUMP (MISCELLANEOUS) ×1 IMPLANT
CUFF TOURN SGL QUICK 34 (TOURNIQUET CUFF)
CUFF TRNQT CYL 34X4.125X (TOURNIQUET CUFF) ×1 IMPLANT
DRAPE 3/4 80X56 (DRAPES) ×2 IMPLANT
DRAPE INCISE IOBAN 66X60 STRL (DRAPES) ×1 IMPLANT
ELECT REM PT RETURN 9FT ADLT (ELECTROSURGICAL) ×1
ELECTRODE REM PT RTRN 9FT ADLT (ELECTROSURGICAL) ×1 IMPLANT
GAUZE SPONGE 4X4 12PLY STRL (GAUZE/BANDAGES/DRESSINGS) ×1 IMPLANT
GAUZE XEROFORM 1X8 LF (GAUZE/BANDAGES/DRESSINGS) ×1 IMPLANT
GLOVE BIO SURGEON STRL SZ8 (GLOVE) ×1 IMPLANT
GLOVE BIOGEL PI IND STRL 8.5 (GLOVE) ×2 IMPLANT
GLOVE BIOGEL PI INDICATOR 8.5 (GLOVE) ×2
GLOVE SURG ORTHO 8.5 STRL (GLOVE) ×1 IMPLANT
GOWN STRL REUS W/ TWL LRG LVL3 (GOWN DISPOSABLE) ×1 IMPLANT
GOWN STRL REUS W/ TWL XL LVL3 (GOWN DISPOSABLE) ×1 IMPLANT
GOWN STRL REUS W/TWL LRG LVL3 (GOWN DISPOSABLE) ×1
GOWN STRL REUS W/TWL XL LVL3 (GOWN DISPOSABLE) ×1
HOOD PEEL AWAY FLYTE STAYCOOL (MISCELLANEOUS) ×2 IMPLANT
INSERT TIB XLPE 9 SZ 3-4 (Knees) IMPLANT
IV NS IRRIG 3000ML ARTHROMATIC (IV SOLUTION) ×1 IMPLANT
KIT TURNOVER KIT A (KITS) ×1 IMPLANT
MANIFOLD NEPTUNE II (INSTRUMENTS) ×1 IMPLANT
MAT ABSORB  FLUID 56X50 GRAY (MISCELLANEOUS) ×1
MAT ABSORB FLUID 56X50 GRAY (MISCELLANEOUS) ×1 IMPLANT
NDL SAFETY ECLIP 18X1.5 (MISCELLANEOUS) ×1 IMPLANT
NDL SPNL 20GX3.5 QUINCKE YW (NEEDLE) ×1 IMPLANT
NEEDLE SPNL 20GX3.5 QUINCKE YW (NEEDLE) ×1 IMPLANT
NS IRRIG 1000ML POUR BTL (IV SOLUTION) ×1 IMPLANT
PACK TOTAL KNEE (MISCELLANEOUS) ×1 IMPLANT
PAD DE MAYO PRESSURE PROTECT (MISCELLANEOUS) ×1 IMPLANT
PAD WRAPON POLAR KNEE (MISCELLANEOUS) ×1 IMPLANT
PULSAVAC PLUS IRRIG FAN TIP (DISPOSABLE) ×1
SOLUTION IRRIG SURGIPHOR (IV SOLUTION) ×1 IMPLANT
SOLUTION PRONTOSAN WOUND 350ML (IRRIGATION / IRRIGATOR) ×1 IMPLANT
STAPLER SKIN PROX 35W (STAPLE) ×1 IMPLANT
SUCTION FRAZIER HANDLE 10FR (MISCELLANEOUS) ×1
SUCTION TUBE FRAZIER 10FR DISP (MISCELLANEOUS) ×1 IMPLANT
SUT DVC 2 QUILL PDO  T11 36X36 (SUTURE) ×1
SUT DVC 2 QUILL PDO T11 36X36 (SUTURE) ×1 IMPLANT
SUT VIC AB 2-0 CT1 18 (SUTURE) ×1 IMPLANT
SUT VIC AB 2-0 CT1 27 (SUTURE)
SUT VIC AB 2-0 CT1 TAPERPNT 27 (SUTURE) IMPLANT
SUT VIC AB PLUS 45CM 1-MO-4 (SUTURE) ×1 IMPLANT
SYR 30ML LL (SYRINGE) ×3 IMPLANT
TIBIAL BASEPLATE SZ 3 (Knees) ×1 IMPLANT
TIP FAN IRRIG PULSAVAC PLUS (DISPOSABLE) ×1 IMPLANT
TRAP FLUID SMOKE EVACUATOR (MISCELLANEOUS) ×1 IMPLANT
WATER STERILE IRR 500ML POUR (IV SOLUTION) ×1 IMPLANT
WRAPON POLAR PAD KNEE (MISCELLANEOUS) ×1

## 2022-02-10 NOTE — Anesthesia Procedure Notes (Signed)
Anesthesia Regional Block: Adductor canal block   Pre-Anesthetic Checklist: , timeout performed,  Correct Patient, Correct Site, Correct Laterality,  Correct Procedure,, site marked,  Risks and benefits discussed,  Surgical consent,  Pre-op evaluation,  At surgeon's request and post-op pain management  Laterality: Right  Prep: chloraprep       Needles:  Injection technique: Single-shot  Needle Type: Echogenic Needle          Additional Needles:   Procedures:,,,, ultrasound used (permanent image in chart),,   Motor weakness within 20 minutes.  Narrative:  Start time: 02/10/2022 2:22 PM End time: 02/10/2022 2:24 PM Injection made incrementally with aspirations every 5 mL.  Performed by: Personally  Anesthesiologist: Darrin Nipper, MD  Additional Notes: Functioning IV was confirmed and monitors applied.  Sterile prep and drape, hand hygiene and sterile gloves were used. Ultrasound guidance: relevant anatomy identified, needle position confirmed, local anesthetic spread visualized around nerve(s), vascular puncture avoided.  Image saved to electronic medical record.  Negative aspiration prior to incremental administration of local anesthetic for total 20 ml bupivacaine 0.5% given in adductor saphenous distribution. The patient tolerated the procedure well. Vital signs and moderate sedation medications recorded in RN notes.

## 2022-02-10 NOTE — Progress Notes (Signed)
Notified patient family that patient will be going to room 156 on the first floor. Patient has had dinner and is doing great.

## 2022-02-10 NOTE — Anesthesia Procedure Notes (Addendum)
Spinal  Patient location during procedure: OR Staffing Performed: resident/CRNA and other anesthesia staff  Resident/CRNA: Lerry Liner, CRNA Other anesthesia staff: Lenord Fellers, RN Performed by: Lerry Liner, CRNA Authorized by: Molli Barrows, MD   Preanesthetic Checklist Completed: patient identified, IV checked, site marked, risks and benefits discussed, surgical consent, monitors and equipment checked, pre-op evaluation and timeout performed Spinal Block Patient position: sitting Prep: ChloraPrep Patient monitoring: cardiac monitor, continuous pulse ox, blood pressure and heart rate Approach: midline Location: L4-5 Injection technique: single-shot Needle Needle type: Whitacre  Needle gauge: 22 G Needle length: 9 cm Assessment Events: CSF return Additional Notes Negative heme, negative paresthesia, no pain with injection.  Good free flow CSF pre/post.   Pt tolerated well.

## 2022-02-10 NOTE — Progress Notes (Signed)
Notified Dr. Andree Elk that patient has been sinus bradycardic no symptoms, and was noted to be brady in preop. Spinal level is still at the thigh but has been progressing down. Per Dr. Andree Elk patient is okay to go to the floor.

## 2022-02-10 NOTE — Op Note (Signed)
DATE OF SURGERY:  02/10/2022 TIME: 4:59 PM  PATIENT NAME:  Lynn Wood   AGE: 76 y.o.    PRE-OPERATIVE DIAGNOSIS:  M17.11 Unilateral primary osteoarthritis, right knee  POST-OPERATIVE DIAGNOSIS:  Same  PROCEDURE:  Procedure(s): TOTAL KNEE ARTHROPLASTY, RIGHT   SURGEON:  Lovell Sheehan, MD   ASSISTANT:  Carlynn Spry,  PA-C  OPERATIVE IMPLANTS: Tamala [employer], Cruciate Retaining Oxinium Femoral component size 4, Fixed Bearing Tray size 3, Patella polyethylene 3-peg oval button size 35 mm, with a 9 mm DISH insert.   PREOPERATIVE INDICATIONS:  Lynn Wood is an 76 y.o. female who has a diagnosis of M17.11 Unilateral primary osteoarthritis, right knee and elected for a total knee arthroplasty after failing nonoperative treatment, including activity modification, pain medication, physical therapy and injections who has significant impairment of their activities of daily living.  Radiographs have demonstrated tricompartmental osteoarthritis joint space narrowing, osteophytes, subchondral sclerosis and cyst formation.  The risks, benefits, and alternatives were discussed at length including but not limited to the risks of infection, bleeding, nerve or blood vessel injury, knee stiffness, fracture, dislocation, loosening or failure of the hardware and the need for further surgery. Medical risks include but not limited to DVT and pulmonary embolism, myocardial infarction, stroke, pneumonia, respiratory failure and death. I discussed these risks with the patient in my office prior to the date of surgery. They understood these risks and were willing to proceed.  OPERATIVE FINDINGS AND UNIQUE ASPECTS OF THE CASE:  All three compartments with advanced and severe degenerative changes, large osteophytes and an abundance of synovial fluid. Significant deformity was also noted. A decision was made to proceed with total knee arthroplasty.   OPERATIVE DESCRIPTION:  The patient was  brought to the operative room and placed in a supine position after undergoing placement of a general anesthetic. IV antibiotics were given. Patient received tranexamic acid. The lower extremity was prepped and draped in the usual sterile fashion.  A time out was performed to verify the patient's name, date of birth, medical record number, correct site of surgery and correct procedure to be performed. The timeout was also used to confirm the patient received antibiotics and that appropriate instruments, implants and radiographs studies were available in the room.  The leg was elevated and exsanguinated with an Esmarch and the tourniquet was inflated to 250 mmHg.  A midline incision was made over the left knee.. A medial parapatellar arthrotomy was then made and the patella subluxed laterally and the knee was brought into 90 of flexion. Hoffa's fat pad along with the anterior cruciate ligament was resected and the medial joint line was exposed.  Attention was then turned to preparation of the patella. The thickness of the patella was measured with a caliper, the diameter measured with the patella templates.  The patella resection was then made with an oscillating saw using the patella cutting guide.  The 35 mm button fit appropriately.  3 peg holes for the patella component were then drilled.  The extramedullary tibial cutting guide was then placed using the anterior tibial crest and second ray of the foot as a reference.  The tibial cutting guide was adjusted to allow for appropriate posterior slope.  The tibial cutting block was pinned into position. The slotted stylus was used to measure the proximal tibial resection of 9 mm off the high lateral side. Care was taken during the tibial resection to protect the medial and collateral ligaments.  The resected tibial bone was removed.  The distal femur was resected using the intramedullary cutting guide.  Care was taken to protect the collateral ligaments  during distal femoral resection.  The distal femoral resection was performed with an oscillating saw. The femoral cutting guide was then removed. Extension gap was measured with a 9 mm spacer block and alignment and extension was confirmed using a long alignment rod. The femur was sized to be a 4. Rotation of the referencing guide was checked with the epicondylar axis and Whitesides line. Then the 4-in-1 cutting jig was then applied to the distal femur. A stylus was used to confirm that the anterior femur would not be notched.   Then the anterior, posterior and chamfer femoral cuts were then made with an oscillating saw.  The knee was distracted and all posterior osteophytes were removed.  The flexion gap was then measured with a flexion spacer block and long alignment rod and was found to be symmetric with the extension gap and perpendicular to mechanical axis of the tibia.  The proximal tibia plateau was then sized with trial trays. The best coverage was achieved with a size 3. This tibial tray was then pinned into position. The proximal tibia was then prepared with the keel punch.  After tibial preparation was completed, all trial components were inserted with polyethylene trials. The knee achieved full extension and flexed to 120 degrees. Ligament were stable to varus and valgus at full extension as well as 30, 60 and 90 degrees of flexion.   The trials were then placed. Knee was taken through a full range of motion and deemed to be stable with the trial components. All trial components were then removed.  The joint was copiously irrigated with pulse lavage.  The final total knee arthroplasty components were then cemented into place. The knee was held in extension while cement was allowed to cure.The knee was taken through a range of motion and the patella tracked well and the knee was again irrigated copiously.  The knee capsule was then injected with Exparel.  The medial arthrotomy was closed with #1  Vicryl and #2 Quill. The subcutaneous tissue closed with  2-0 vicryl, and skin approximated with staples.  A dry sterile and compressive dressing was applied.  A Polar Care was applied to the operative knee.  The patient was awakened and brought to the PACU in stable and satisfactory condition.  All sharp, lap and instrument counts were correct at the conclusion the case. I spoke with the patient's family in the postop consultation room to let them know the case had been performed without complication and the patient was stable in recovery room.   Total tourniquet time was 49 minutes.

## 2022-02-10 NOTE — Transfer of Care (Signed)
Immediate Anesthesia Transfer of Care Note  Patient: Lynn Wood  Procedure(s) Performed: TOTAL KNEE ARTHROPLASTY (Right: Knee)  Patient Location: PACU  Anesthesia Type:Spinal  Level of Consciousness: drowsy  Airway & Oxygen Therapy: Patient Spontanous Breathing and Patient connected to face mask oxygen  Post-op Assessment: Report given to RN  Post vital signs: stable  Last Vitals:  Vitals Value Taken Time  BP    Temp    Pulse 62 02/10/22 1706  Resp 14 02/10/22 1706  SpO2 100 % 02/10/22 1706  Vitals shown include unvalidated device data.  Last Pain:  Vitals:   02/10/22 1329  TempSrc: Temporal  PainSc: 7          Complications: No notable events documented.

## 2022-02-10 NOTE — H&P (Signed)
The patient has been re-examined, and the chart reviewed, and there have been no interval changes to the documented history and physical.  Plan a right total knee today.  Anesthesia is consulted regarding a peripheral nerve block for post-operative pain.  The risks, benefits, and alternatives have been discussed at length, and the patient is willing to proceed.     

## 2022-02-10 NOTE — Anesthesia Preprocedure Evaluation (Addendum)
Anesthesia Evaluation  Patient identified by MRN, date of birth, ID band Patient awake    Reviewed: Allergy & Precautions, NPO status , Patient's Chart, lab work & pertinent test results  History of Anesthesia Complications Negative for: history of anesthetic complications  Airway Mallampati: I   Neck ROM: Full    Dental  (+) Lower Dentures, Upper Dentures   Pulmonary Current Smoker (2-3 cigarettes per day)Patient did not abstain from smoking.,    Pulmonary exam normal breath sounds clear to auscultation       Cardiovascular hypertension, + Peripheral Vascular Disease  Normal cardiovascular exam Rhythm:Regular Rate:Normal  ECG 02/02/22: Normal sinus rhythm with sinus arrhythmia Normal ECG   Neuro/Psych negative neurological ROS     GI/Hepatic negative GI ROS,   Endo/Other  diabetes, Type 2  Renal/GU negative Renal ROS     Musculoskeletal  (+) Arthritis ,   Abdominal   Peds  Hematology negative hematology ROS (+)   Anesthesia Other Findings   Reproductive/Obstetrics                            Anesthesia Physical Anesthesia Plan  ASA: 2  Anesthesia Plan: General, Spinal and Regional   Post-op Pain Management: Regional block*   Induction: Intravenous  PONV Risk Score and Plan: 2 and Propofol infusion, TIVA, Treatment may vary due to age or medical condition and Ondansetron  Airway Management Planned: Natural Airway and Nasal Cannula  Additional Equipment:   Intra-op Plan:   Post-operative Plan:   Informed Consent: I have reviewed the patients History and Physical, chart, labs and discussed the procedure including the risks, benefits and alternatives for the proposed anesthesia with the patient or authorized representative who has indicated his/her understanding and acceptance.       Plan Discussed with: CRNA  Anesthesia Plan Comments: (Plan for preoperative adductor  saphenous nerve block, spinal and GA with natural airway, LMA/GETA backup.  Patient consented for risks of anesthesia including but not limited to:  - adverse reactions to medications - damage to eyes, teeth, lips or other oral mucosa - nerve damage due to positioning  - sore throat or hoarseness - headache, bleeding, infection, nerve damage 2/2 spinal - damage to heart, brain, nerves, lungs, other parts of body or loss of life  Informed patient about role of CRNA in peri- and intra-operative care.  Patient voiced understanding.)        Anesthesia Quick Evaluation

## 2022-02-11 ENCOUNTER — Encounter: Payer: Self-pay | Admitting: Orthopedic Surgery

## 2022-02-11 DIAGNOSIS — M1711 Unilateral primary osteoarthritis, right knee: Secondary | ICD-10-CM | POA: Diagnosis not present

## 2022-02-11 DIAGNOSIS — I10 Essential (primary) hypertension: Secondary | ICD-10-CM | POA: Diagnosis not present

## 2022-02-11 DIAGNOSIS — E119 Type 2 diabetes mellitus without complications: Secondary | ICD-10-CM | POA: Diagnosis not present

## 2022-02-11 DIAGNOSIS — Z853 Personal history of malignant neoplasm of breast: Secondary | ICD-10-CM | POA: Diagnosis not present

## 2022-02-11 MED ORDER — METHOCARBAMOL 750 MG PO TABS: 750.0000 mg | ORAL_TABLET | Freq: Three times a day (TID) | ORAL | 0 refills | Status: DC | PRN

## 2022-02-11 MED ORDER — DOCUSATE SODIUM 100 MG PO CAPS: 100.0000 mg | ORAL_CAPSULE | Freq: Two times a day (BID) | ORAL | 0 refills | Status: DC

## 2022-02-11 MED ORDER — HYDROCODONE-ACETAMINOPHEN 5-325 MG PO TABS: 1.0000 | ORAL_TABLET | ORAL | 0 refills | Status: DC | PRN

## 2022-02-11 MED ORDER — ASPIRIN 81 MG PO CHEW: 81.0000 mg | CHEWABLE_TABLET | Freq: Two times a day (BID) | ORAL | 0 refills | Status: AC

## 2022-02-11 NOTE — Progress Notes (Signed)
Met with the patient in the room She lives with her husband in the home, [employer] to deliver a 3 in 1 and a RW to the bedside I contacted [place] with [facility] to see if they can accept the patient They accepted the patient for [facility] services, she will DC home today

## 2022-02-11 NOTE — Progress Notes (Cosign Needed)
Patient is not able to walk the distance required to go the bathroom, or he/she is unable to safely negotiate stairs required to access the bathroom.  A 3in1 BSC will alleviate this problem  

## 2022-02-11 NOTE — Discharge Instructions (Signed)
Continue weight bear as tolerated on the right lower extremity.    Elevate the right lower extremity whenever possible and continue the polar care while elevating the extremity. Patient may shower. No bath or submerging the wound.    Take aspirin 81 mg twice daily for 30 days as directed for blood clot prevention.  Continue to work on knee range of motion exercises at home as instructed by physical therapy. Continue to use a walker for assistance with ambulation until cleared by physical therapy.  Call [phone] with any questions, such as fever > 101.5 degrees, drainage from the wound or shortness of breath. 

## 2022-02-11 NOTE — Plan of Care (Signed)
  Problem: Education: Goal: Knowledge of the prescribed therapeutic regimen will improve Outcome: Progressing   Problem: Clinical Measurements: Goal: Postoperative complications will be avoided or minimized Outcome: Progressing   Problem: Education: Goal: Knowledge of General Education information will improve Description: Including pain rating scale, medication(s)/side effects and non-pharmacologic comfort measures Outcome: Progressing   Problem: Activity: Goal: Risk for activity intolerance will decrease Outcome: Progressing   Problem: Nutrition: Goal: Adequate nutrition will be maintained Outcome: Progressing   Problem: Coping: Goal: Level of anxiety will decrease Outcome: Progressing

## 2022-02-11 NOTE — Evaluation (Signed)
Physical Therapy Evaluation Patient Details Name: Lynn Wood MRN: [id number] DOB: 1946/03/08 Today's Date: 02/11/2022  History of Present Illness  Pt is a 76 yo female s/p R TKA. PMH of smoking, HTN, PVD, DMII, breast cancer.  Clinical Impression  Patient alert, agreeable to PT reported no pain at rest but with mobility reported 8/10, RN notified. Pt stated at baseline she is independent, lives with her husband (who cannot provide much physical assist) but that her sister could be available for stair navigation at discharge if needed.  She was able to perform supine to sit modI, sit <> stand with supervision and cues for hand placement with good carryover by end of session. She ambulated ~268f with RW and CGA-supervision. Antalgic gait noted with decreased RLE weight bearing, no LOB. Pt able to ascend/descend 4 steps with R rail, CGA and verbalized technique.  Overall the patient demonstrated deficits (see "PT Problem List") that impede the patient's functional abilities, safety, and mobility and would benefit from skilled PT intervention. Recommendation at this time is HHPT with intermittent supervision/assistance to maximize function, safety, and mobility.        Recommendations for follow up therapy are one component of a multi-disciplinary discharge planning process, led by the attending physician.  Recommendations may be updated based on patient status, additional functional criteria and insurance authorization.  Follow Up Recommendations Home health PT      Assistance Recommended at Discharge Intermittent Supervision/Assistance  Patient can return home with the following  A little help with bathing/dressing/bathroom;Assistance with cooking/housework;Assist for transportation;Help with stairs or ramp for entrance    Equipment Recommendations Rolling walker (2 wheels);BSC/3in1  Recommendations for Other Services       Functional Status Assessment Patient has had a recent  decline in their functional status and demonstrates the ability to make significant improvements in function in a reasonable and predictable amount of time.     Precautions / Restrictions Precautions Precautions: Fall;Knee Precaution Booklet Issued: Yes (comment) Restrictions Weight Bearing Restrictions: Yes RLE Weight Bearing: Weight bearing as tolerated      Mobility  Bed Mobility Overal bed mobility: Modified Independent                  Transfers Overall transfer level: Needs assistance Equipment used: Rolling walker (2 wheels) Transfers: Sit to/from Stand Sit to Stand: Supervision           General transfer comment: cued for hand placement with good carryover at end of session    Ambulation/Gait Ambulation/Gait assistance: Min guard, Supervision Gait Distance (Feet): 220 Feet Assistive device: Rolling walker (2 wheels)         General Gait Details: antalgic gait on R, decreased weight bearing on RLE  Stairs            Wheelchair Mobility    Modified Rankin (Stroke Patients Only)       Balance Overall balance assessment: Needs assistance Sitting-balance support: Feet supported Sitting balance-Leahy Scale: Good       Standing balance-Leahy Scale: Fair                               Pertinent Vitals/Pain Pain Assessment Pain Assessment: 0-10 Pain Score: 8  Pain Location: R knee Pain Descriptors / Indicators: Aching, Sore, Grimacing Pain Intervention(s): Limited activity within patient's tolerance, Monitored during session, Repositioned, Ice applied    Home Living Family/patient expects to be discharged to:: Private residence Living Arrangements: Spouse/significant  other Available Help at Discharge: Family;Available 24 hours/day (not able to provide much physical assistance) Type of Home: House Home Access: Stairs to enter Entrance Stairs-Rails: Right Entrance Stairs-Number of Steps: 3   Home Layout: One level Home  Equipment: Shower seat      Prior Function Prior Level of Function : Independent/Modified Independent                     Hand Dominance        Extremity/Trunk Assessment   Upper Extremity Assessment Upper Extremity Assessment: Overall WFL for tasks assessed    Lower Extremity Assessment Lower Extremity Assessment: Overall WFL for tasks assessed (s/p R TKA)    Cervical / Trunk Assessment Cervical / Trunk Assessment: Normal  Communication   Communication: No difficulties  Cognition Arousal/Alertness: Awake/alert Behavior During Therapy: WFL for tasks assessed/performed Overall Cognitive Status: Within Functional Limits for tasks assessed                                          General Comments      Exercises     Assessment/Plan    PT Assessment Patient needs continued PT services  PT Problem List Decreased strength;Decreased mobility;Decreased range of motion;Decreased activity tolerance;Decreased balance;Pain;Decreased knowledge of use of DME;Decreased knowledge of precautions       PT Treatment Interventions DME instruction;Gait training;Balance training;Therapeutic exercise;Stair training;Neuromuscular re-education;Functional mobility training;Therapeutic activities;Patient/family education    PT Goals (Current goals can be found in the Care Plan section)  Acute Rehab PT Goals Patient Stated Goal: to go home PT Goal Formulation: With patient Time For Goal Achievement: 02/25/22 Potential to Achieve Goals: Good    Frequency BID     Co-evaluation               AM-PAC PT "6 Clicks" Mobility  Outcome Measure Help needed turning from your back to your side while in a flat bed without using bedrails?: None Help needed moving from lying on your back to sitting on the side of a flat bed without using bedrails?: None Help needed moving to and from a bed to a chair (including a wheelchair)?: None Help needed standing up from a  chair using your arms (e.g., wheelchair or bedside chair)?: None Help needed to walk in hospital room?: None Help needed climbing 3-5 steps with a railing? : A Little 6 Click Score: 23    End of Session Equipment Utilized During Treatment: Gait belt Activity Tolerance: Patient tolerated treatment well Patient left: in chair;with call bell/phone within reach Nurse Communication: Mobility status PT Visit Diagnosis: Other abnormalities of gait and mobility (R26.89);Difficulty in walking, not elsewhere classified (R26.2);Muscle weakness (generalized) (M62.81);Pain Pain - Right/Left: Right Pain - part of body: Knee    Time: 7672-0947 PT Time Calculation (min) (ACUTE ONLY): 23 min   Charges:   PT Evaluation $PT Eval Low Complexity: 1 Low PT Treatments $Therapeutic Activity: 8-22 mins       Lieutenant Diego PT, DPT 9:40 AM,02/11/22

## 2022-02-11 NOTE — Progress Notes (Signed)
Discharge instructions reviewed with patient including followup visits and new medications.  Understanding was verbalized and all questions were answered.  IV removed without complication; patient tolerated well.  Patient discharged home via wheelchair in stable condition escorted by nursing staff.  

## 2022-02-11 NOTE — Discharge Summary (Signed)
Physician Discharge Summary  Patient ID: Lynn Wood MRN: [id number] DOB/AGE: 1946/01/25 76 y.o.  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, right  Discharge Diagnoses:  M17.11 Unilateral primary osteoarthritis, right knee Principal Problem:   S/P TKR (total knee replacement) using cement, right   Past Medical History:  Diagnosis Date   Arthritis    Benign neoplasm of colon    Breast cancer ([place]) 08/2016   left   Cancer ([place]) 06/2016   left breast   Carpal tunnel syndrome    DCIS (ductal carcinoma in situ) of breast    Diabetes mellitus without complication (HCC)    Hypertension    Nontoxic uninodular goiter    Pre-diabetes    Pure hypercholesterolemia    Vaginal vault prolapse    Vitamin D deficiency     Surgeries: Procedure(s): TOTAL KNEE ARTHROPLASTY on 02/10/2022   Consultants (if any):   Discharged Condition: Improved  Hospital Course: Lynn Wood is an 76 y.o. 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    She was given perioperative antibiotics:  Anti-infectives (From admission, onward)    Start     Dose/Rate Route Frequency Ordered Stop   02/10/22 2130  ceFAZolin (ANCEF) IVPB 1 g/50 mL premix        1 g 100 mL/hr over 30 Minutes Intravenous Every 6 hours 02/10/22 1937 02/11/22 0350   02/10/22 1345  ceFAZolin (ANCEF) 2-4 GM/100ML-% IVPB       Note to Pharmacy: Herby Abraham W: cabinet override      02/10/22 1345 02/10/22 1538   02/10/22 0600  ceFAZolin (ANCEF) IVPB 2g/100 mL premix        2 g 200 mL/hr over 30 Minutes Intravenous On call to O.R. 02/09/22 2247 02/10/22 1541     .  She was given sequential  compression devices, early ambulation, and aspirin 81 mg twice daily for 30 days for DVT prophylaxis.  She benefited maximally from the hospital stay and there were no complications.    Recent vital signs:  Vitals:   02/10/22 2350 02/11/22 0559  BP: (!) 140/78 (!) 162/61  Pulse: (!) 56 62  Resp: 18 18  Temp: 97.9 F (36.6 C) 98 F (36.7 C)  SpO2: 100% 97%    Recent laboratory studies:  Lab Results  Component Value Date   HGB 12.3 02/02/2022   HGB 12.9 09/13/2017   HGB 12.1 03/15/2017   Lab Results  Component Value Date   WBC 5.8 02/02/2022   PLT 362 02/02/2022   Lab Results  Component Value Date   INR 1.11 07/29/2016   Lab Results  Component Value Date   NA 140 02/02/2022   K 3.8 02/02/2022   CL 111 02/02/2022   CO2 24 02/02/2022   BUN 15 02/02/2022   CREATININE 0.72 02/02/2022   GLUCOSE 93 02/02/2022    Discharge Medications:   Allergies as of 02/11/2022       Reactions   Lipitor [atorvastatin] Other (See Comments)   Arthalgia   Lipofen [fenofibrate] Other (See Comments)   Arthalgia   Mevacor [lovastatin] Other (See Comments)   Arthalgia   Zetia [ezetimibe] Other (See Comments)   Arthalgia   Zocor [simvastatin] Other (See Comments)  Arthalgia        Medication List     STOP taking these medications    aspirin EC 81 MG tablet Replaced by: aspirin 81 MG chewable tablet       TAKE these medications    aspirin 81 MG chewable tablet Chew 1 tablet (81 mg total) by mouth 2 (two) times daily. Replaces: aspirin EC 81 MG tablet   atenolol 50 MG tablet Commonly known as: TENORMIN Take 50 mg by mouth at bedtime.   docusate sodium 100 MG capsule Commonly known as: COLACE Take 1 capsule (100 mg total) by mouth 2 (two) times daily.   FISH OIL PO Take 1 capsule by mouth every other day.   fluticasone 50 MCG/ACT nasal spray Commonly known as: FLONASE Place 1 spray into both nostrils daily as needed for allergies.   HYDROcodone-acetaminophen  5-325 MG tablet Commonly known as: NORCO/VICODIN Take 1 tablet by mouth every 4 (four) hours as needed for moderate pain (pain score 4-6).   lisinopril 20 MG tablet Commonly known as: ZESTRIL Take 20 mg by mouth at bedtime.   methocarbamol 750 MG tablet Commonly known as: Robaxin-750 Take 1 tablet (750 mg total) by mouth every 8 (eight) hours as needed for muscle spasms.   Vitamin D 50 MCG (2000 UT) Caps Take 1 capsule by mouth daily.               Durable Medical Equipment  (From admission, onward)           Start     Ordered   02/11/22 0811  For home use only DME 3 n 1  Once        02/11/22 0810   02/11/22 0811  For home use only DME Walker rolling  Once       Question Answer Comment  Walker: With 5 Inch Wheels   Patient needs a walker to treat with the following condition Osteoarthritis of right knee      02/11/22 0810            Diagnostic Studies: [place] OR NERVE BLOCK-IMAGE ONLY [facility])  Result Date: 02/10/2022 There is no interpretation for this exam.  This order is for images obtained during a surgical procedure.  Please See "Surgeries" Tab for more information regarding the procedure.    Disposition: Discharge disposition: 01-Home or Self Care            Signed: Carlynn Spry ,PA-C 02/11/2022, 8:11 AM

## 2022-02-11 NOTE — Progress Notes (Signed)
  Subjective:  Patient reports pain as mild to moderate.    Objective:   VITALS:   Vitals:   02/10/22 1948 02/10/22 2252 02/10/22 2350 02/11/22 0559  BP: (!) 178/62 (!) 204/80 (!) 140/78 (!) 162/61  Pulse: (!) 48 (!) 54 (!) 56 62  Resp: $Remo'16 16 18 18  'tALJb$ Temp: 97.6 F (36.4 C) 97.9 F (36.6 C) 97.9 F (36.6 C) 98 F (36.7 C)  TempSrc: Oral  Oral   SpO2: 100% 100% 100% 97%  Weight:      Height:        PHYSICAL EXAM:  Neurologically intact ABD soft Neurovascular intact Sensation intact distally Intact pulses distally Dorsiflexion/Plantar flexion intact Incision: dressing C/D/I No cellulitis present Compartment soft  LABS  Results for orders placed or performed during the hospital encounter of 02/10/22 (from the past 24 hour(s))  Glucose, capillary     Status: None   Collection Time: 02/10/22  1:30 PM  Result Value Ref Range   Glucose-Capillary 95 70 - 99 mg/dL  ABO/Rh     Status: None   Collection Time: 02/10/22  1:47 PM  Result Value Ref Range   ABO/RH(D)      O POS Performed at [facility], [address] [place], [place] [address]   Glucose, capillary     Status: None   Collection Time: 02/10/22  5:12 PM  Result Value Ref Range   Glucose-Capillary 91 70 - 99 mg/dL    [place] OR NERVE BLOCK-IMAGE ONLY [facility])  Result Date: 02/10/2022 There is no interpretation for this exam.  This order is for images obtained during a surgical procedure.  Please See "Surgeries" Tab for more information regarding the procedure.    Assessment/Plan: 1 Day Post-Op   Principal Problem:   S/P TKR (total knee replacement) using cement, right   Advance diet Up with therapy Discharge home today if PT goals met  Carlynn Spry , PA-C 02/11/2022, 8:07 AM

## 2022-02-17 NOTE — Anesthesia Postprocedure Evaluation (Signed)
Anesthesia Post Note  Patient: Lynn Wood  Procedure(s) Performed: TOTAL KNEE ARTHROPLASTY (Right: Knee)  Patient location during evaluation: PACU Anesthesia Type: Regional Level of consciousness: awake and alert Pain management: pain level controlled Vital Signs Assessment: post-procedure vital signs reviewed and stable Respiratory status: spontaneous breathing, nonlabored ventilation, respiratory function stable and patient connected to nasal cannula oxygen Cardiovascular status: blood pressure returned to baseline and stable Postop Assessment: no apparent nausea or vomiting Anesthetic complications: no   No notable events documented.   Last Vitals:  Vitals:   02/11/22 0817 02/11/22 1212  BP: (!) 181/55 (!) 140/70  Pulse: 64 (!) 54  Resp:    Temp: 36.7 C 36.6 C  SpO2: 100% 99%    Last Pain:  Vitals:   02/11/22 1115  TempSrc:   PainSc: Adin Latana Colin

## 2022-02-23 DIAGNOSIS — Z96659 Presence of unspecified artificial knee joint: Secondary | ICD-10-CM | POA: Diagnosis not present

## 2022-02-23 DIAGNOSIS — M1711 Unilateral primary osteoarthritis, right knee: Secondary | ICD-10-CM | POA: Diagnosis not present

## 2022-03-03 DIAGNOSIS — M25661 Stiffness of right knee, not elsewhere classified: Secondary | ICD-10-CM | POA: Diagnosis not present

## 2022-03-03 DIAGNOSIS — Z96651 Presence of right artificial knee joint: Secondary | ICD-10-CM | POA: Diagnosis not present

## 2022-03-03 DIAGNOSIS — M25561 Pain in right knee: Secondary | ICD-10-CM | POA: Diagnosis not present

## 2022-03-03 DIAGNOSIS — R2689 Other abnormalities of gait and mobility: Secondary | ICD-10-CM | POA: Diagnosis not present

## 2022-03-05 DIAGNOSIS — M25661 Stiffness of right knee, not elsewhere classified: Secondary | ICD-10-CM | POA: Diagnosis not present

## 2022-03-05 DIAGNOSIS — R2689 Other abnormalities of gait and mobility: Secondary | ICD-10-CM | POA: Diagnosis not present

## 2022-03-05 DIAGNOSIS — M25561 Pain in right knee: Secondary | ICD-10-CM | POA: Diagnosis not present

## 2022-03-05 DIAGNOSIS — Z96651 Presence of right artificial knee joint: Secondary | ICD-10-CM | POA: Diagnosis not present

## 2022-03-10 DIAGNOSIS — R2689 Other abnormalities of gait and mobility: Secondary | ICD-10-CM | POA: Diagnosis not present

## 2022-03-10 DIAGNOSIS — M25661 Stiffness of right knee, not elsewhere classified: Secondary | ICD-10-CM | POA: Diagnosis not present

## 2022-03-10 DIAGNOSIS — Z96651 Presence of right artificial knee joint: Secondary | ICD-10-CM | POA: Diagnosis not present

## 2022-03-10 DIAGNOSIS — M25561 Pain in right knee: Secondary | ICD-10-CM | POA: Diagnosis not present

## 2022-03-12 DIAGNOSIS — M25561 Pain in right knee: Secondary | ICD-10-CM | POA: Diagnosis not present

## 2022-03-12 DIAGNOSIS — M25661 Stiffness of right knee, not elsewhere classified: Secondary | ICD-10-CM | POA: Diagnosis not present

## 2022-03-12 DIAGNOSIS — R2689 Other abnormalities of gait and mobility: Secondary | ICD-10-CM | POA: Diagnosis not present

## 2022-03-12 DIAGNOSIS — Z96651 Presence of right artificial knee joint: Secondary | ICD-10-CM | POA: Diagnosis not present

## 2022-03-17 DIAGNOSIS — M25661 Stiffness of right knee, not elsewhere classified: Secondary | ICD-10-CM | POA: Diagnosis not present

## 2022-03-17 DIAGNOSIS — Z96651 Presence of right artificial knee joint: Secondary | ICD-10-CM | POA: Diagnosis not present

## 2022-03-17 DIAGNOSIS — M25561 Pain in right knee: Secondary | ICD-10-CM | POA: Diagnosis not present

## 2022-03-17 DIAGNOSIS — R2689 Other abnormalities of gait and mobility: Secondary | ICD-10-CM | POA: Diagnosis not present

## 2022-03-18 DIAGNOSIS — H259 Unspecified age-related cataract: Secondary | ICD-10-CM | POA: Diagnosis not present

## 2022-03-18 DIAGNOSIS — H5203 Hypermetropia, bilateral: Secondary | ICD-10-CM | POA: Diagnosis not present

## 2022-03-18 DIAGNOSIS — H52223 Regular astigmatism, bilateral: Secondary | ICD-10-CM | POA: Diagnosis not present

## 2022-03-18 DIAGNOSIS — H524 Presbyopia: Secondary | ICD-10-CM | POA: Diagnosis not present

## 2022-03-19 DIAGNOSIS — R2689 Other abnormalities of gait and mobility: Secondary | ICD-10-CM | POA: Diagnosis not present

## 2022-03-19 DIAGNOSIS — M25561 Pain in right knee: Secondary | ICD-10-CM | POA: Diagnosis not present

## 2022-03-19 DIAGNOSIS — M25661 Stiffness of right knee, not elsewhere classified: Secondary | ICD-10-CM | POA: Diagnosis not present

## 2022-03-19 DIAGNOSIS — Z96651 Presence of right artificial knee joint: Secondary | ICD-10-CM | POA: Diagnosis not present

## 2022-03-24 DIAGNOSIS — Z96651 Presence of right artificial knee joint: Secondary | ICD-10-CM | POA: Diagnosis not present

## 2022-03-24 DIAGNOSIS — M25661 Stiffness of right knee, not elsewhere classified: Secondary | ICD-10-CM | POA: Diagnosis not present

## 2022-03-24 DIAGNOSIS — M25561 Pain in right knee: Secondary | ICD-10-CM | POA: Diagnosis not present

## 2022-03-24 DIAGNOSIS — R2689 Other abnormalities of gait and mobility: Secondary | ICD-10-CM | POA: Diagnosis not present

## 2022-03-25 DIAGNOSIS — Z96651 Presence of right artificial knee joint: Secondary | ICD-10-CM | POA: Diagnosis not present

## 2022-03-26 DIAGNOSIS — M25661 Stiffness of right knee, not elsewhere classified: Secondary | ICD-10-CM | POA: Diagnosis not present

## 2022-03-26 DIAGNOSIS — Z96651 Presence of right artificial knee joint: Secondary | ICD-10-CM | POA: Diagnosis not present

## 2022-03-26 DIAGNOSIS — M25561 Pain in right knee: Secondary | ICD-10-CM | POA: Diagnosis not present

## 2022-03-26 DIAGNOSIS — R2689 Other abnormalities of gait and mobility: Secondary | ICD-10-CM | POA: Diagnosis not present

## 2022-03-30 DIAGNOSIS — E7800 Pure hypercholesterolemia, unspecified: Secondary | ICD-10-CM | POA: Diagnosis not present

## 2022-03-30 DIAGNOSIS — I10 Essential (primary) hypertension: Secondary | ICD-10-CM | POA: Diagnosis not present

## 2022-03-30 DIAGNOSIS — L719 Rosacea, unspecified: Secondary | ICD-10-CM | POA: Diagnosis not present

## 2022-03-30 DIAGNOSIS — E1151 Type 2 diabetes mellitus with diabetic peripheral angiopathy without gangrene: Secondary | ICD-10-CM | POA: Diagnosis not present

## 2022-03-30 DIAGNOSIS — T466X5A Adverse effect of antihyperlipidemic and antiarteriosclerotic drugs, initial encounter: Secondary | ICD-10-CM | POA: Diagnosis not present

## 2022-03-30 DIAGNOSIS — Z0000 Encounter for general adult medical examination without abnormal findings: Secondary | ICD-10-CM | POA: Diagnosis not present

## 2022-03-30 DIAGNOSIS — Z1331 Encounter for screening for depression: Secondary | ICD-10-CM | POA: Diagnosis not present

## 2022-03-30 DIAGNOSIS — M791 Myalgia, unspecified site: Secondary | ICD-10-CM | POA: Diagnosis not present

## 2022-03-31 DIAGNOSIS — Z96651 Presence of right artificial knee joint: Secondary | ICD-10-CM | POA: Diagnosis not present

## 2022-03-31 DIAGNOSIS — R2689 Other abnormalities of gait and mobility: Secondary | ICD-10-CM | POA: Diagnosis not present

## 2022-03-31 DIAGNOSIS — M25661 Stiffness of right knee, not elsewhere classified: Secondary | ICD-10-CM | POA: Diagnosis not present

## 2022-03-31 DIAGNOSIS — M25561 Pain in right knee: Secondary | ICD-10-CM | POA: Diagnosis not present

## 2022-03-31 DIAGNOSIS — E7800 Pure hypercholesterolemia, unspecified: Secondary | ICD-10-CM | POA: Diagnosis not present

## 2022-03-31 DIAGNOSIS — E1151 Type 2 diabetes mellitus with diabetic peripheral angiopathy without gangrene: Secondary | ICD-10-CM | POA: Diagnosis not present

## 2022-04-02 DIAGNOSIS — R2689 Other abnormalities of gait and mobility: Secondary | ICD-10-CM | POA: Diagnosis not present

## 2022-04-02 DIAGNOSIS — Z96651 Presence of right artificial knee joint: Secondary | ICD-10-CM | POA: Diagnosis not present

## 2022-04-02 DIAGNOSIS — M25561 Pain in right knee: Secondary | ICD-10-CM | POA: Diagnosis not present

## 2022-04-02 DIAGNOSIS — M25661 Stiffness of right knee, not elsewhere classified: Secondary | ICD-10-CM | POA: Diagnosis not present

## 2022-04-07 DIAGNOSIS — M25661 Stiffness of right knee, not elsewhere classified: Secondary | ICD-10-CM | POA: Diagnosis not present

## 2022-04-07 DIAGNOSIS — R2689 Other abnormalities of gait and mobility: Secondary | ICD-10-CM | POA: Diagnosis not present

## 2022-04-07 DIAGNOSIS — Z96651 Presence of right artificial knee joint: Secondary | ICD-10-CM | POA: Diagnosis not present

## 2022-04-07 DIAGNOSIS — M25561 Pain in right knee: Secondary | ICD-10-CM | POA: Diagnosis not present

## 2022-04-09 DIAGNOSIS — Z96651 Presence of right artificial knee joint: Secondary | ICD-10-CM | POA: Diagnosis not present

## 2022-04-09 DIAGNOSIS — M25661 Stiffness of right knee, not elsewhere classified: Secondary | ICD-10-CM | POA: Diagnosis not present

## 2022-04-09 DIAGNOSIS — M25561 Pain in right knee: Secondary | ICD-10-CM | POA: Diagnosis not present

## 2022-04-09 DIAGNOSIS — R2689 Other abnormalities of gait and mobility: Secondary | ICD-10-CM | POA: Diagnosis not present

## 2022-04-14 DIAGNOSIS — Z96651 Presence of right artificial knee joint: Secondary | ICD-10-CM | POA: Diagnosis not present

## 2022-04-14 DIAGNOSIS — R2689 Other abnormalities of gait and mobility: Secondary | ICD-10-CM | POA: Diagnosis not present

## 2022-04-14 DIAGNOSIS — M25661 Stiffness of right knee, not elsewhere classified: Secondary | ICD-10-CM | POA: Diagnosis not present

## 2022-04-14 DIAGNOSIS — M25561 Pain in right knee: Secondary | ICD-10-CM | POA: Diagnosis not present

## 2022-04-16 DIAGNOSIS — R2689 Other abnormalities of gait and mobility: Secondary | ICD-10-CM | POA: Diagnosis not present

## 2022-04-16 DIAGNOSIS — Z96651 Presence of right artificial knee joint: Secondary | ICD-10-CM | POA: Diagnosis not present

## 2022-04-16 DIAGNOSIS — M25661 Stiffness of right knee, not elsewhere classified: Secondary | ICD-10-CM | POA: Diagnosis not present

## 2022-04-16 DIAGNOSIS — M25561 Pain in right knee: Secondary | ICD-10-CM | POA: Diagnosis not present

## 2022-04-21 DIAGNOSIS — R2689 Other abnormalities of gait and mobility: Secondary | ICD-10-CM | POA: Diagnosis not present

## 2022-04-21 DIAGNOSIS — M25661 Stiffness of right knee, not elsewhere classified: Secondary | ICD-10-CM | POA: Diagnosis not present

## 2022-04-21 DIAGNOSIS — Z96651 Presence of right artificial knee joint: Secondary | ICD-10-CM | POA: Diagnosis not present

## 2022-04-21 DIAGNOSIS — M25561 Pain in right knee: Secondary | ICD-10-CM | POA: Diagnosis not present

## 2022-04-23 DIAGNOSIS — M25561 Pain in right knee: Secondary | ICD-10-CM | POA: Diagnosis not present

## 2022-04-23 DIAGNOSIS — R2689 Other abnormalities of gait and mobility: Secondary | ICD-10-CM | POA: Diagnosis not present

## 2022-04-23 DIAGNOSIS — M25661 Stiffness of right knee, not elsewhere classified: Secondary | ICD-10-CM | POA: Diagnosis not present

## 2022-04-23 DIAGNOSIS — Z96651 Presence of right artificial knee joint: Secondary | ICD-10-CM | POA: Diagnosis not present

## 2022-04-28 DIAGNOSIS — R2689 Other abnormalities of gait and mobility: Secondary | ICD-10-CM | POA: Diagnosis not present

## 2022-04-28 DIAGNOSIS — Z96651 Presence of right artificial knee joint: Secondary | ICD-10-CM | POA: Diagnosis not present

## 2022-04-28 DIAGNOSIS — M25661 Stiffness of right knee, not elsewhere classified: Secondary | ICD-10-CM | POA: Diagnosis not present

## 2022-04-28 DIAGNOSIS — M25561 Pain in right knee: Secondary | ICD-10-CM | POA: Diagnosis not present

## 2022-05-21 ENCOUNTER — Other Ambulatory Visit (INDEPENDENT_AMBULATORY_CARE_PROVIDER_SITE_OTHER): Payer: Self-pay | Admitting: Nurse Practitioner

## 2022-05-21 DIAGNOSIS — I739 Peripheral vascular disease, unspecified: Secondary | ICD-10-CM

## 2022-05-24 NOTE — Progress Notes (Deleted)
MRN : [id number]  Lynn Wood is a 76 y.o. (02-09-1946) female who presents with chief complaint of check circulation.  History of Present Illness:    The patient is seen for evaluation of painful lower extremities and diminished pulses. Patient notes the pain is always associated with activity and is very consistent day today. Typically, the pain occurs at less than one block, progress is as activity continues to the point that the patient must stop walking. Resting including standing still for several minutes allows the patient to walk a similar distance before being forced to stop again. Uneven terrain and inclines shorten the distance. The pain has been progressive over the past several years. The patient denies any abrupt changes in claudication symptoms.  The patient states the inability to walk is causing problems with daily activities.  The patient denies rest pain or dangling of an extremity off the side of the bed during the night for relief. No open wounds or sores at this time. No prior interventions or surgeries.  No history of back problems or DJD of the lumbar sacral spine.   The patient's blood pressure has been stable and relatively well controlled. The patient denies amaurosis fugax or recent TIA symptoms. There are no recent neurological changes noted. The patient denies history of DVT, PE or superficial thrombophlebitis. The patient denies recent episodes of angina or shortness of breath.   No outpatient medications have been marked as taking for the 05/25/22 encounter (Appointment) with Delana Meyer, Dolores Lory, MD.    Past Medical History:  Diagnosis Date   Arthritis    Benign neoplasm of colon    Breast cancer ([place]) 08/2016   left   Cancer ([place]) 06/2016   left breast   Carpal tunnel syndrome    DCIS (ductal carcinoma in situ) of breast    Diabetes mellitus without complication ([place])    Hypertension    Nontoxic uninodular goiter     Pre-diabetes    Pure hypercholesterolemia    Vaginal vault prolapse    Vitamin D deficiency     Past Surgical History:  Procedure Laterality Date   COLONOSCOPY WITH PROPOFOL N/A 01/22/2015   Procedure: COLONOSCOPY WITH PROPOFOL;  Surgeon: Lollie Sails, MD;  Location: [facility] ENDOSCOPY;  Service: Endoscopy;  Laterality: N/A;   COLONOSCOPY WITH PROPOFOL N/A 05/07/2020   Procedure: COLONOSCOPY WITH PROPOFOL;  Surgeon: Lesly Rubenstein, MD;  Location: [facility] ENDOSCOPY;  Service: Endoscopy;  Laterality: N/A;   GYNECOLOGIC CRYOSURGERY     INCISION AND DRAINAGE ABSCESS Left 07/29/2016   Procedure: Reexploration of left breast mastectomy site;  Surgeon: Jules Husbands, MD;  Location: [facility] ORS;  Service: General;  Laterality: Left;   LOWER EXTREMITY ANGIOGRAPHY Left 05/19/2018   Procedure: LOWER EXTREMITY ANGIOGRAPHY;  Surgeon: Algernon Huxley, MD;  Location: [facility];  Service: Cardiovascular;  Laterality: Left;   MASTECTOMY Left    March 2018   MASTECTOMY W/ SENTINEL NODE BIOPSY Left 07/28/2016   Procedure: MASTECTOMY WITH SENTINEL LYMPH NODE BIOPSY;  Surgeon: Jules Husbands, MD;  Location: [facility] ORS;  Service: General;  Laterality: Left;   MULTIPLE TOOTH EXTRACTIONS  1997   TOTAL KNEE ARTHROPLASTY Right 02/10/2022   Procedure: TOTAL KNEE ARTHROPLASTY;  Surgeon: Lovell Sheehan, MD;  Location: [facility] ORS;  Service: Orthopedics;  Laterality: Right;   TUBAL LIGATION      Social History Social History  Tobacco Use   Smoking status: Light Smoker    Packs/day: 0.25    Types: Cigarettes   Smokeless tobacco: Never   Tobacco comments:    1 cigarette every 1-2 weeks  Vaping Use   Vaping Use: Never used  Substance Use Topics   Alcohol use: No   Drug use: No    Family History Family History  Problem Relation Age of Onset   Breast cancer Cousin        mat cousin   Colon cancer Mother    Prostate cancer Father    Prostate cancer Brother    Heart disease Daughter      Allergies  Allergen Reactions   Lipitor [Atorvastatin] Other (See Comments)    Arthalgia   Lipofen [Fenofibrate] Other (See Comments)    Arthalgia   Mevacor [Lovastatin] Other (See Comments)    Arthalgia   Zetia [Ezetimibe] Other (See Comments)    Arthalgia   Zocor [Simvastatin] Other (See Comments)    Arthalgia     REVIEW OF SYSTEMS (Negative unless checked)  Constitutional: '[]'$ Weight loss  '[]'$ Fever  '[]'$ Chills Cardiac: '[]'$ Chest pain   '[]'$ Chest pressure   '[]'$ Palpitations   '[]'$ Shortness of breath when laying flat   '[]'$ Shortness of breath with exertion. Vascular:  '[x]'$ Pain in legs with walking   '[]'$ Pain in legs at rest  '[]'$ History of DVT   '[]'$ Phlebitis   '[]'$ Swelling in legs   '[]'$ Varicose veins   '[]'$ Non-healing ulcers Pulmonary:   '[]'$ Uses home oxygen   '[]'$ Productive cough   '[]'$ Hemoptysis   '[]'$ Wheeze  '[]'$ COPD   '[]'$ Asthma Neurologic:  '[]'$ Dizziness   '[]'$ Seizures   '[]'$ History of stroke   '[]'$ History of TIA  '[]'$ Aphasia   '[]'$ Vissual changes   '[]'$ Weakness or numbness in arm   '[]'$ Weakness or numbness in leg Musculoskeletal:   '[]'$ Joint swelling   '[]'$ Joint pain   '[]'$ Low back pain Hematologic:  '[]'$ Easy bruising  '[]'$ Easy bleeding   '[]'$ Hypercoagulable state   '[]'$ Anemic Gastrointestinal:  '[]'$ Diarrhea   '[]'$ Vomiting  '[]'$ Gastroesophageal reflux/heartburn   '[]'$ Difficulty swallowing. Genitourinary:  '[]'$ Chronic kidney disease   '[]'$ Difficult urination  '[]'$ Frequent urination   '[]'$ Blood in urine Skin:  '[]'$ Rashes   '[]'$ Ulcers  Psychological:  '[]'$ History of anxiety   '[]'$  History of major depression.  Physical Examination  There were no vitals filed for this visit. There is no height or weight on file to calculate BMI. Gen: WD/WN, NAD Head: [place]/AT, No temporalis wasting.  Ear/Nose/Throat: Hearing grossly intact, nares w/o erythema or drainage Eyes: PER, EOMI, sclera nonicteric.  Neck: Supple, no masses.  No bruit or JVD.  Pulmonary:  Good air movement, no audible wheezing, no use of accessory muscles.  Cardiac: RRR, normal S1, S2, no  Murmurs. Vascular:  mild trophic changes, no open wounds Vessel Right Left  Radial Palpable Palpable  PT Not Palpable Not Palpable  DP Not Palpable Not Palpable  Gastrointestinal: soft, non-distended. No guarding/no peritoneal signs.  Musculoskeletal: M/S 5/5 throughout.  No visible deformity.  Neurologic: CN 2-12 intact. Pain and light touch intact in extremities.  Symmetrical.  Speech is fluent. Motor exam as listed above. Psychiatric: Judgment intact, Mood & affect appropriate for pt's clinical situation. Dermatologic: No rashes or ulcers noted.  No changes consistent with cellulitis.   CBC Lab Results  Component Value Date   WBC 5.8 02/02/2022   HGB 12.3 02/02/2022   HCT 37.9 02/02/2022   MCV 85.6 02/02/2022   PLT 362 02/02/2022    BMET    Component Value Date/Time   NA 140  02/02/2022 1012   K 3.8 02/02/2022 1012   CL 111 02/02/2022 1012   CO2 24 02/02/2022 1012   GLUCOSE 93 02/02/2022 1012   BUN 15 02/02/2022 1012   CREATININE 0.72 02/02/2022 1012   CALCIUM 9.2 02/02/2022 1012   GFRNONAA >60 02/02/2022 1012   GFRAA >60 05/19/2018 1059   CrCl cannot be calculated (Patient's most recent lab result is older than the maximum 21 days allowed.).  COAG Lab Results  Component Value Date   INR 1.11 07/29/2016    Radiology No results found.   Assessment/Plan There are no diagnoses linked to this encounter.   Hortencia Pilar, MD  05/24/2022 3:29 PM

## 2022-05-25 ENCOUNTER — Encounter (INDEPENDENT_AMBULATORY_CARE_PROVIDER_SITE_OTHER): Payer: Medicare HMO | Admitting: Vascular Surgery

## 2022-05-25 ENCOUNTER — Encounter (INDEPENDENT_AMBULATORY_CARE_PROVIDER_SITE_OTHER): Payer: Medicare HMO

## 2022-05-25 DIAGNOSIS — I10 Essential (primary) hypertension: Secondary | ICD-10-CM

## 2022-05-25 DIAGNOSIS — E7800 Pure hypercholesterolemia, unspecified: Secondary | ICD-10-CM

## 2022-05-25 DIAGNOSIS — I70213 Atherosclerosis of native arteries of extremities with intermittent claudication, bilateral legs: Secondary | ICD-10-CM

## 2022-05-25 DIAGNOSIS — E119 Type 2 diabetes mellitus without complications: Secondary | ICD-10-CM

## 2022-08-12 ENCOUNTER — Encounter (INDEPENDENT_AMBULATORY_CARE_PROVIDER_SITE_OTHER): Payer: Medicare HMO

## 2022-08-12 ENCOUNTER — Encounter (INDEPENDENT_AMBULATORY_CARE_PROVIDER_SITE_OTHER): Payer: Medicare HMO | Admitting: Nurse Practitioner

## 2022-08-28 ENCOUNTER — Encounter (INDEPENDENT_AMBULATORY_CARE_PROVIDER_SITE_OTHER): Payer: Self-pay | Admitting: Nurse Practitioner

## 2022-08-28 ENCOUNTER — Ambulatory Visit (INDEPENDENT_AMBULATORY_CARE_PROVIDER_SITE_OTHER): Payer: Medicare PPO

## 2022-08-28 ENCOUNTER — Ambulatory Visit (INDEPENDENT_AMBULATORY_CARE_PROVIDER_SITE_OTHER): Payer: Medicare PPO | Admitting: Nurse Practitioner

## 2022-08-28 VITALS — BP 195/76 | HR 64 | Resp 18 | Ht 64.0 in | Wt 152.8 lb

## 2022-08-28 DIAGNOSIS — Z9889 Other specified postprocedural states: Secondary | ICD-10-CM | POA: Diagnosis not present

## 2022-08-28 DIAGNOSIS — I10 Essential (primary) hypertension: Secondary | ICD-10-CM

## 2022-08-28 DIAGNOSIS — E7800 Pure hypercholesterolemia, unspecified: Secondary | ICD-10-CM | POA: Diagnosis not present

## 2022-08-28 DIAGNOSIS — E119 Type 2 diabetes mellitus without complications: Secondary | ICD-10-CM

## 2022-08-28 DIAGNOSIS — I739 Peripheral vascular disease, unspecified: Secondary | ICD-10-CM | POA: Diagnosis not present

## 2022-09-07 LAB — VAS US ABI WITH/WO TBI
Left ABI: 0.86
Right ABI: 0.81

## 2022-09-15 ENCOUNTER — Encounter (INDEPENDENT_AMBULATORY_CARE_PROVIDER_SITE_OTHER): Payer: Self-pay | Admitting: Nurse Practitioner

## 2022-09-15 NOTE — Progress Notes (Signed)
Subjective:    Patient ID: Lynn Wood, female    DOB: 08/25/1945, 77 y.o.   MRN: [id number] Chief Complaint  Patient presents with   New Patient (Initial Visit)    Np ABI + consult. PAD. referred by feldpausch.    The patient returns to the office for followup and review of the noninvasive studies.  The patient has had previous intervention in 2019  There have been no interval changes in lower extremity symptoms. No interval shortening of the patient's claudication distance or development of rest pain symptoms. No new ulcers or wounds have occurred since the last visit.  There have been no significant changes to the patient's overall health care.  The patient denies amaurosis fugax or recent TIA symptoms. There are no documented recent neurological changes noted. There is no history of DVT, PE or superficial thrombophlebitis. The patient denies recent episodes of angina or shortness of breath.   ABI Rt=0.81 and Lt=0.86  (previous ABI's Rt=0.98 and Lt=0.88) Duplex ultrasound of the the patient has triphasic/monophasic waveforms on the right with triphasic waveforms on the left the patient has slightly dampened toe waveforms bilaterally.    Review of Systems  Cardiovascular:  Negative for leg swelling.  Skin:  Negative for wound.  All other systems reviewed and are negative.      Objective:   Physical Exam Vitals reviewed.  HENT:     Head: Normocephalic.  Cardiovascular:     Rate and Rhythm: Normal rate.     Pulses:          Dorsalis pedis pulses are detected w/ Doppler on the right side and detected w/ Doppler on the left side.       Posterior tibial pulses are detected w/ Doppler on the right side and detected w/ Doppler on the left side.  Pulmonary:     Effort: Pulmonary effort is normal.  Skin:    General: Skin is warm and dry.  Neurological:     Mental Status: She is alert and oriented to person, place, and time.  Psychiatric:        Mood and Affect:  Mood normal.        Behavior: Behavior normal.        Thought Content: Thought content normal.        Judgment: Judgment normal.     BP (!) 195/76 (BP Location: Right Arm)   Pulse 64   Resp 18   Ht 5\' 4"  (1.626 m)   Wt 152 lb 12.8 oz (69.3 kg)   BMI 26.23 kg/m   Past Medical History:  Diagnosis Date   Arthritis    Benign neoplasm of colon    Breast cancer 08/2016   left   Cancer 06/2016   left breast   Carpal tunnel syndrome    DCIS (ductal carcinoma in situ) of breast    Diabetes mellitus without complication    Hypertension    Nontoxic uninodular goiter    Pre-diabetes    Pure hypercholesterolemia    Vaginal vault prolapse    Vitamin D deficiency     Social History   Socioeconomic History   Marital status: Married    Spouse name: Not on file   Number of children: 2   Years of education: 14   Highest education level: Not on file  Occupational History   Occupation: retired  Tobacco Use   Smoking status: Light Smoker    Packs/day: .25    Types: Cigarettes   Smokeless  tobacco: Never   Tobacco comments:    1 cigarette every 1-2 weeks  Vaping Use   Vaping Use: Never used  Substance and Sexual Activity   Alcohol use: No   Drug use: No   Sexual activity: Not on file  Other Topics Concern   Not on file  Social History Narrative   Lives with husband in a one story home.  Has 2 children.  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  Education: college.    Social Determinants of Health   Financial Resource Strain: Not on file  Food Insecurity: Not on file  Transportation Needs: Not on file  Physical Activity: Not on file  Stress: Not on file  Social Connections: Not on file  Intimate Partner Violence: Not on file    Past Surgical History:  Procedure Laterality Date   COLONOSCOPY WITH PROPOFOL N/A 01/22/2015   Procedure: COLONOSCOPY WITH PROPOFOL;  Surgeon: Lollie Sails, MD;  Location: [facility] ENDOSCOPY;   Service: Endoscopy;  Laterality: N/A;   COLONOSCOPY WITH PROPOFOL N/A 05/07/2020   Procedure: COLONOSCOPY WITH PROPOFOL;  Surgeon: Lesly Rubenstein, MD;  Location: [facility] ENDOSCOPY;  Service: Endoscopy;  Laterality: N/A;   GYNECOLOGIC CRYOSURGERY     INCISION AND DRAINAGE ABSCESS Left 07/29/2016   Procedure: Reexploration of left breast mastectomy site;  Surgeon: Jules Husbands, MD;  Location: [facility] ORS;  Service: General;  Laterality: Left;   LOWER EXTREMITY ANGIOGRAPHY Left 05/19/2018   Procedure: LOWER EXTREMITY ANGIOGRAPHY;  Surgeon: Algernon Huxley, MD;  Location: [facility];  Service: Cardiovascular;  Laterality: Left;   MASTECTOMY Left    March 2018   MASTECTOMY W/ SENTINEL NODE BIOPSY Left 07/28/2016   Procedure: MASTECTOMY WITH SENTINEL LYMPH NODE BIOPSY;  Surgeon: Jules Husbands, MD;  Location: [facility] ORS;  Service: General;  Laterality: Left;   MULTIPLE TOOTH EXTRACTIONS  1997   TOTAL KNEE ARTHROPLASTY Right 02/10/2022   Procedure: TOTAL KNEE ARTHROPLASTY;  Surgeon: Lovell Sheehan, MD;  Location: [facility] ORS;  Service: Orthopedics;  Laterality: Right;   TUBAL LIGATION      Family History  Problem Relation Age of Onset   Breast cancer Cousin        mat cousin   Colon cancer Mother    Prostate cancer Father    Prostate cancer Brother    Heart disease Daughter     Allergies  Allergen Reactions   Lipitor [Atorvastatin] Other (See Comments)    Arthalgia   Lipofen [Fenofibrate] Other (See Comments)    Arthalgia   Mevacor [Lovastatin] Other (See Comments)    Arthalgia   Zetia [Ezetimibe] Other (See Comments)    Arthalgia   Zocor [Simvastatin] Other (See Comments)    Arthalgia       Latest Ref Rng & Units 02/02/2022   10:12 AM 09/13/2017   11:34 AM 03/15/2017   10:29 AM  CBC  WBC 4.0 - 10.5 K/uL 5.8  5.5  5.4   Hemoglobin 12.0 - 15.0 g/dL 12.3  12.9  12.1   Hematocrit 36.0 - 46.0 % 37.9  38.0  35.5   Platelets 150 - 400 K/uL 362  273  254       CMP      Component Value Date/Time   NA 140 02/02/2022 1012   K 3.8 02/02/2022 1012   CL 111 02/02/2022 1012   CO2 24 02/02/2022 1012   GLUCOSE 93 02/02/2022 1012  BUN 15 02/02/2022 1012   CREATININE 0.72 02/02/2022 1012   CALCIUM 9.2 02/02/2022 1012   PROT 7.3 09/13/2017 1134   ALBUMIN 4.0 09/13/2017 1134   AST 19 09/13/2017 1134   ALT 11 (L) 09/13/2017 1134   ALKPHOS 79 09/13/2017 1134   BILITOT 0.4 09/13/2017 1134   GFRNONAA >60 02/02/2022 1012   GFRAA >60 05/19/2018 1059     VAS [place] ABI WITH/WO TBI  Result Date: 09/07/2022  LOWER EXTREMITY DOPPLER STUDY Patient Name:  Elizabella Erikson  Date of Exam:   08/28/2022 Medical Rec #: [id number]              Accession #:    [id number] Date of Birth: 04-Dec-1945              Patient Gender: F Patient Age:   29 years Exam Location:  [facility] Procedure:      VAS [place] ABI WITH/WO TBI Referring Phys: Eulogio Ditch --------------------------------------------------------------------------------  Indications: Claudication, and peripheral artery disease. High Risk Factors: Hypertension, hyperlipidemia, Diabetes.  Vascular Interventions: 05/19/2018: Angiogram and Selctive Angiogram, PTA's of                         Lt ATA and Distal Popliteal Artery. PTA's of the Left                         SFA and Popliteal Artery. Performing Technologist: Delorise Shiner RVT  Examination Guidelines: A complete evaluation includes at minimum, Doppler waveform signals and systolic blood pressure reading at the level of bilateral brachial, anterior tibial, and posterior tibial arteries, when vessel segments are accessible. Bilateral testing is considered an integral part of a complete examination. Photoelectric Plethysmograph (PPG) waveforms and toe systolic pressure readings are included as required and additional duplex testing as needed. Limited examinations for reoccurring indications may be performed as noted.  ABI Findings:  +---------+------------------+-----+----------+--------+ Right    Rt Pressure (mmHg)IndexWaveform  Comment  +---------+------------------+-----+----------+--------+ Brachial 197                                       +---------+------------------+-----+----------+--------+ PTA      160               0.81 triphasic          +---------+------------------+-----+----------+--------+ DP       149               0.76 monophasic         +---------+------------------+-----+----------+--------+ Great Toe102               0.52                    +---------+------------------+-----+----------+--------+ +---------+------------------+-----+---------+-------+ Left     Lt Pressure (mmHg)IndexWaveform Comment +---------+------------------+-----+---------+-------+ Brachial 193                                     +---------+------------------+-----+---------+-------+ PTA      171               0.87 triphasic        +---------+------------------+-----+---------+-------+ DP       170               0.86 triphasic        +---------+------------------+-----+---------+-------+  Great Toe118               0.60                  +---------+------------------+-----+---------+-------+ +-------+-----------+-----------+------------+------------+ ABI/TBIToday's ABIToday's TBIPrevious ABIPrevious TBI +-------+-----------+-----------+------------+------------+ Right  0.81       0.52       0.98        0.76         +-------+-----------+-----------+------------+------------+ Left   0.86       0.60       0.88        0.75         +-------+-----------+-----------+------------+------------+ Right ABIs appear decreased compared to prior study on 04/04/2019. Left ABIs appear essentially unchanged compared to prior study on 04/04/2019.  Summary: Right: Resting right ankle-brachial index indicates mild right lower extremity arterial disease. The right toe-brachial index is abnormal.  Left: Resting left ankle-brachial index indicates mild left lower extremity arterial disease. The left toe-brachial index is abnormal. *See table(s) above for measurements and observations.  Electronically signed by Hortencia Pilar MD on 09/07/2022 at 4:19:51 PM.    Final        Assessment & Plan:   1. Peripheral arterial disease with history of revascularization (HCC)  Recommend:  The patient has evidence of atherosclerosis of the lower extremities with claudication.  The patient does not voice lifestyle limiting changes at this point in time.  Noninvasive studies do not suggest clinically significant change.  No invasive studies, angiography or surgery at this time The patient should continue walking and begin a more formal exercise program.  The patient should continue antiplatelet therapy and aggressive treatment of the lipid abnormalities  No changes in the patient's medications at this time  Continued surveillance is indicated as atherosclerosis is likely to progress with time.    The patient will continue follow up with noninvasive studies as ordered.   2. Type 2 diabetes mellitus without complication, without long-term current use of insulin (HCC) Continue hypoglycemic medications as already ordered, these medications have been reviewed and there are no changes at this time.  Hgb A1C to be monitored as already arranged by primary service  3. Benign essential hypertension Continue antihypertensive medications as already ordered, these medications have been reviewed and there are no changes at this time.  Patient's blood pressure is very elevated today.  Patient notes this is unusual for her.  She is advised to take blood pressure at home and if it remains elevated to this level she should seek emergency attention.  4. Pure hypercholesterolemia Continue statin as ordered and reviewed, no changes at this time   Current Outpatient Medications on File Prior to Visit   Medication Sig Dispense Refill   aspirin 81 MG chewable tablet Chew 1 tablet (81 mg total) by mouth 2 (two) times daily. 60 tablet 0   atenolol (TENORMIN) 50 MG tablet Take 50 mg by mouth at bedtime.     Cholecalciferol (VITAMIN D) 2000 units CAPS Take 1 capsule by mouth daily.     docusate sodium (COLACE) 100 MG capsule Take 1 capsule (100 mg total) by mouth 2 (two) times daily. 30 capsule 0   fluticasone (FLONASE) 50 MCG/ACT nasal spray Place 1 spray into both nostrils daily as needed for allergies.      HYDROcodone-acetaminophen (NORCO/VICODIN) 5-325 MG tablet Take 1 tablet by mouth every 4 (four) hours as needed for moderate pain (pain score 4-6). 30 tablet 0   lisinopril (PRINIVIL,ZESTRIL) 20 MG tablet  Take 20 mg by mouth at bedtime.     methocarbamol (ROBAXIN-750) 750 MG tablet Take 1 tablet (750 mg total) by mouth every 8 (eight) hours as needed for muscle spasms. 60 tablet 0   Omega-3 Fatty Acids (FISH OIL PO) Take 1 capsule by mouth every other day.     No current facility-administered medications on file prior to visit.    There are no Patient Instructions on file for this visit. No follow-ups on file.   Kris Hartmann, NP

## 2022-10-06 DIAGNOSIS — I10 Essential (primary) hypertension: Secondary | ICD-10-CM | POA: Diagnosis not present

## 2022-10-06 DIAGNOSIS — T466X5A Adverse effect of antihyperlipidemic and antiarteriosclerotic drugs, initial encounter: Secondary | ICD-10-CM | POA: Diagnosis not present

## 2022-10-06 DIAGNOSIS — E1151 Type 2 diabetes mellitus with diabetic peripheral angiopathy without gangrene: Secondary | ICD-10-CM | POA: Diagnosis not present

## 2022-10-06 DIAGNOSIS — M791 Myalgia, unspecified site: Secondary | ICD-10-CM | POA: Diagnosis not present

## 2022-10-06 DIAGNOSIS — L719 Rosacea, unspecified: Secondary | ICD-10-CM | POA: Diagnosis not present

## 2022-10-06 DIAGNOSIS — E7800 Pure hypercholesterolemia, unspecified: Secondary | ICD-10-CM | POA: Diagnosis not present

## 2022-10-26 ENCOUNTER — Ambulatory Visit: Payer: Medicare HMO | Admitting: Podiatry

## 2022-10-26 ENCOUNTER — Encounter: Payer: Self-pay | Admitting: Podiatry

## 2022-10-26 VITALS — BP 185/77

## 2022-10-26 DIAGNOSIS — Q828 Other specified congenital malformations of skin: Secondary | ICD-10-CM | POA: Diagnosis not present

## 2022-10-26 DIAGNOSIS — E119 Type 2 diabetes mellitus without complications: Secondary | ICD-10-CM

## 2022-10-26 DIAGNOSIS — M2011 Hallux valgus (acquired), right foot: Secondary | ICD-10-CM | POA: Diagnosis not present

## 2022-10-26 DIAGNOSIS — B351 Tinea unguium: Secondary | ICD-10-CM | POA: Diagnosis not present

## 2022-10-26 DIAGNOSIS — M79674 Pain in right toe(s): Secondary | ICD-10-CM | POA: Diagnosis not present

## 2022-10-26 DIAGNOSIS — E1151 Type 2 diabetes mellitus with diabetic peripheral angiopathy without gangrene: Secondary | ICD-10-CM

## 2022-10-26 DIAGNOSIS — M79675 Pain in left toe(s): Secondary | ICD-10-CM

## 2022-10-28 NOTE — Progress Notes (Signed)
ANNUAL DIABETIC FOOT EXAM  Subjective: Lynn Wood presents today annual diabetic foot exam.  Chief Complaint  Patient presents with   Nail Problem    DFC,Referring Provider Marina Goodell, MD,LOV:04/24      Patient confirms h/o diabetes.  Patient denies any h/o foot wounds.  She has h/o PAD.  Patient denies any numbness, tingling, burning, or pins/needle sensation in feet.  Risk factors: diabetes, PAD with intermittent claudication, HTN, hypercholesterolemia, current tobacco user.  Marina Goodell, MD is patient's PCP.  Past Medical History:  Diagnosis Date   Arthritis    Benign neoplasm of colon    Breast cancer (HCC) 08/2016   left   Cancer (HCC) 06/2016   left breast   Carpal tunnel syndrome    DCIS (ductal carcinoma in situ) of breast    Diabetes mellitus without complication (HCC)    Hypertension    Nontoxic uninodular goiter    Pre-diabetes    Pure hypercholesterolemia    Vaginal vault prolapse    Vitamin D deficiency    Patient Active Problem List   Diagnosis Date Noted   S/P TKR (total knee replacement) using cement, right 02/10/2022   Atherosclerosis of native arteries of extremity with intermittent claudication (HCC) 04/19/2018   PAD (peripheral artery disease) (HCC) 04/19/2018   Other hyperlipidemia 12/10/2017   Left leg numbness 11/22/2017   DCIS (ductal carcinoma in situ) 07/28/2016   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   Primary osteoarthritis of left knee 11/05/2015   Diabetes mellitus type 2, uncomplicated (HCC) 01/02/2014   Vitamin D deficiency 01/02/2014   Nontoxic uninodular goiter 10/31/2013   Benign neoplasm of colon 08/30/2013   Carpal tunnel syndrome 08/30/2013   Benign essential hypertension 08/30/2013   Pure hypercholesterolemia 08/30/2013   Vaginal vault prolapse 08/30/2013   Past Surgical History:  Procedure Laterality Date   COLONOSCOPY WITH PROPOFOL N/A 01/22/2015   Procedure:  COLONOSCOPY WITH PROPOFOL;  Surgeon: Christena Deem, MD;  Location: [facility] ENDOSCOPY;  Service: Endoscopy;  Laterality: N/A;   COLONOSCOPY WITH PROPOFOL N/A 05/07/2020   Procedure: COLONOSCOPY WITH PROPOFOL;  Surgeon: Regis Bill, MD;  Location: [facility] ENDOSCOPY;  Service: Endoscopy;  Laterality: N/A;   GYNECOLOGIC CRYOSURGERY     INCISION AND DRAINAGE ABSCESS Left 07/29/2016   Procedure: Reexploration of left breast mastectomy site;  Surgeon: Leafy Ro, MD;  Location: [facility] ORS;  Service: General;  Laterality: Left;   LOWER EXTREMITY ANGIOGRAPHY Left 05/19/2018   Procedure: LOWER EXTREMITY ANGIOGRAPHY;  Surgeon: Annice Needy, MD;  Location: [facility] INVASIVE CV LAB;  Service: Cardiovascular;  Laterality: Left;   MASTECTOMY Left    March 2018   MASTECTOMY W/ SENTINEL NODE BIOPSY Left 07/28/2016   Procedure: MASTECTOMY WITH SENTINEL LYMPH NODE BIOPSY;  Surgeon: Leafy Ro, MD;  Location: [facility] ORS;  Service: General;  Laterality: Left;   MULTIPLE TOOTH EXTRACTIONS  1997   TOTAL KNEE ARTHROPLASTY Right 02/10/2022   Procedure: TOTAL KNEE ARTHROPLASTY;  Surgeon: Lyndle Herrlich, MD;  Location: [facility] ORS;  Service: Orthopedics;  Laterality: Right;   TUBAL LIGATION     Current Outpatient Medications on File Prior to Visit  Medication Sig Dispense Refill   aspirin 81 MG chewable tablet Chew 1 tablet (81 mg total) by mouth 2 (two) times daily. 60 tablet 0   atenolol (TENORMIN) 50 MG tablet Take 50 mg by mouth at bedtime.     Cholecalciferol (VITAMIN D) 2000 units CAPS Take 1 capsule by  mouth daily.     fluticasone (FLONASE) 50 MCG/ACT nasal spray Place 1 spray into both nostrils daily as needed for allergies.      HYDROcodone-acetaminophen (NORCO/VICODIN) 5-325 MG tablet Take 1 tablet by mouth every 4 (four) hours as needed for moderate pain (pain score 4-6). 30 tablet 0   lisinopril (PRINIVIL,ZESTRIL) 20 MG tablet Take 20 mg by mouth at bedtime.     Omega-3 Fatty Acids (FISH OIL PO) Take 1  capsule by mouth every other day.     docusate sodium (COLACE) 100 MG capsule Take 1 capsule (100 mg total) by mouth 2 (two) times daily. (Patient not taking: Reported on 10/26/2022) 30 capsule 0   methocarbamol (ROBAXIN-750) 750 MG tablet Take 1 tablet (750 mg total) by mouth every 8 (eight) hours as needed for muscle spasms. (Patient not taking: Reported on 10/26/2022) 60 tablet 0   No current facility-administered medications on file prior to visit.    Allergies  Allergen Reactions   Lipitor [Atorvastatin] Other (See Comments)    Arthalgia   Lipofen [Fenofibrate] Other (See Comments)    Arthalgia   Mevacor [Lovastatin] Other (See Comments)    Arthalgia   Zetia [Ezetimibe] Other (See Comments)    Arthalgia   Zocor [Simvastatin] Other (See Comments)    Arthalgia   Social History   Occupational History   Occupation: retired  Tobacco Use   Smoking status: Light Smoker    Packs/day: .25    Types: Cigarettes   Smokeless tobacco: Never   Tobacco comments:    1 cigarette every 1-2 weeks  Vaping Use   Vaping Use: Never used  Substance and Sexual Activity   Alcohol use: No   Drug use: No   Sexual activity: Not on file   Family History  Problem Relation Age of Onset   Breast cancer Cousin        mat cousin   Colon cancer Mother    Prostate cancer Father    Prostate cancer Brother    Heart disease Daughter    Immunization History  Administered Date(s) Administered   Influenza,inj,Quad PF,6+ Mos 07/29/2016   PFIZER Comirnaty(Gray Top)Covid-19 Tri-Sucrose Vaccine 07/06/2019, 07/27/2019, 05/15/2020, 10/15/2020   PFIZER(Purple Top)SARS-COV-2 Vaccination 07/06/2019, 07/27/2019   Pneumococcal Polysaccharide-23 01/24/2020    Review of Systems: Negative except as noted in the HPI.   Objective: Vitals:   10/26/22 1617 10/26/22 1632  BP: (!) 205/78 (!) 185/77   Lynn Wood is a pleasant 77 y.o. female in NAD. AAO X 3.  Vascular Examination: CFT <3 seconds b/l.  DP/PT pulses faintly palpable b/l. Skin temperature gradient warm to warm b/l. No pain with calf compression. No ischemia or gangrene. No cyanosis or clubbing noted b/l. Pedal hair absent.   Neurological Examination: Sensation grossly intact b/l with 10 gram monofilament. Vibratory sensation intact b/l.   Dermatological Examination: Pedal skin warm and supple b/l.   No open wounds. No interdigital macerations.  Toenails 1-5 b/l thick, discolored, elongated with subungual debris and pain on dorsal palpation.    Porokeratotic lesion(s) submet head 1 right, x 2 right foot submet head 2 left foot. No erythema, no edema, no drainage, no fluctuance.  Musculoskeletal Examination: Muscle strength 5/5 to b/l LE. Hallux valgus with bunion deformity noted right lower extremity.  Radiographs: None     ADA Risk Categorization: High Risk  Patient has one or more of the following: Loss of protective sensation Absent pedal pulses Severe Foot deformity History of foot ulcer  Assessment:  1. Pain due to onychomycosis of toenails of both feet   2. Porokeratosis   3. Hallux valgus, right   4. Type II diabetes mellitus with peripheral circulatory disorder (HCC)   5. Encounter for diabetic foot exam [facility])     Plan: -Patient was evaluated and treated. All patient's and/or POA's questions/concerns answered on today's visit. -Diabetic foot examination performed today. -Discussed and educated patient on diabetic foot care, especially with  regards to the vascular, neurological and musculoskeletal systems. -Continue diabetic foot care principles: inspect feet daily, monitor glucose as recommended by PCP and/or Endocrinologist, and follow prescribed diet per PCP, Endocrinologist and/or dietician. -Toenails 1-5 b/l were debrided in length and girth with sterile nail nippers and dremel without iatrogenic bleeding.  -Porokeratotic lesion(s) x 4 as above bilaterally pared and enucleated with sterile  currette without incident. Total number of lesions debrided=4. -Patient/POA to call should there be question/concern in the interim. Return in about 3 months (around 01/26/2023).  Freddie Breech, DPM

## 2022-12-02 DIAGNOSIS — M79604 Pain in right leg: Secondary | ICD-10-CM | POA: Diagnosis not present

## 2022-12-02 DIAGNOSIS — M1611 Unilateral primary osteoarthritis, right hip: Secondary | ICD-10-CM | POA: Diagnosis not present

## 2022-12-02 DIAGNOSIS — Z96651 Presence of right artificial knee joint: Secondary | ICD-10-CM | POA: Diagnosis not present

## 2022-12-16 DIAGNOSIS — M1611 Unilateral primary osteoarthritis, right hip: Secondary | ICD-10-CM | POA: Diagnosis not present

## 2022-12-16 DIAGNOSIS — Z96651 Presence of right artificial knee joint: Secondary | ICD-10-CM | POA: Diagnosis not present

## 2023-01-05 DIAGNOSIS — M25661 Stiffness of right knee, not elsewhere classified: Secondary | ICD-10-CM | POA: Diagnosis not present

## 2023-01-05 DIAGNOSIS — R2689 Other abnormalities of gait and mobility: Secondary | ICD-10-CM | POA: Diagnosis not present

## 2023-01-05 DIAGNOSIS — Z96651 Presence of right artificial knee joint: Secondary | ICD-10-CM | POA: Diagnosis not present

## 2023-01-05 DIAGNOSIS — M25561 Pain in right knee: Secondary | ICD-10-CM | POA: Diagnosis not present

## 2023-01-07 DIAGNOSIS — R2689 Other abnormalities of gait and mobility: Secondary | ICD-10-CM | POA: Diagnosis not present

## 2023-01-07 DIAGNOSIS — M25661 Stiffness of right knee, not elsewhere classified: Secondary | ICD-10-CM | POA: Diagnosis not present

## 2023-01-07 DIAGNOSIS — M25561 Pain in right knee: Secondary | ICD-10-CM | POA: Diagnosis not present

## 2023-01-07 DIAGNOSIS — Z96651 Presence of right artificial knee joint: Secondary | ICD-10-CM | POA: Diagnosis not present

## 2023-01-12 DIAGNOSIS — M25661 Stiffness of right knee, not elsewhere classified: Secondary | ICD-10-CM | POA: Diagnosis not present

## 2023-01-12 DIAGNOSIS — R2689 Other abnormalities of gait and mobility: Secondary | ICD-10-CM | POA: Diagnosis not present

## 2023-01-12 DIAGNOSIS — Z96651 Presence of right artificial knee joint: Secondary | ICD-10-CM | POA: Diagnosis not present

## 2023-01-12 DIAGNOSIS — M25561 Pain in right knee: Secondary | ICD-10-CM | POA: Diagnosis not present

## 2023-01-14 DIAGNOSIS — M25561 Pain in right knee: Secondary | ICD-10-CM | POA: Diagnosis not present

## 2023-01-14 DIAGNOSIS — R2689 Other abnormalities of gait and mobility: Secondary | ICD-10-CM | POA: Diagnosis not present

## 2023-01-14 DIAGNOSIS — M25661 Stiffness of right knee, not elsewhere classified: Secondary | ICD-10-CM | POA: Diagnosis not present

## 2023-01-14 DIAGNOSIS — Z96651 Presence of right artificial knee joint: Secondary | ICD-10-CM | POA: Diagnosis not present

## 2023-01-19 DIAGNOSIS — R2689 Other abnormalities of gait and mobility: Secondary | ICD-10-CM | POA: Diagnosis not present

## 2023-01-19 DIAGNOSIS — M25661 Stiffness of right knee, not elsewhere classified: Secondary | ICD-10-CM | POA: Diagnosis not present

## 2023-01-19 DIAGNOSIS — M25561 Pain in right knee: Secondary | ICD-10-CM | POA: Diagnosis not present

## 2023-01-19 DIAGNOSIS — Z96651 Presence of right artificial knee joint: Secondary | ICD-10-CM | POA: Diagnosis not present

## 2023-01-21 DIAGNOSIS — R2689 Other abnormalities of gait and mobility: Secondary | ICD-10-CM | POA: Diagnosis not present

## 2023-01-21 DIAGNOSIS — M25561 Pain in right knee: Secondary | ICD-10-CM | POA: Diagnosis not present

## 2023-01-21 DIAGNOSIS — Z96651 Presence of right artificial knee joint: Secondary | ICD-10-CM | POA: Diagnosis not present

## 2023-01-21 DIAGNOSIS — M25661 Stiffness of right knee, not elsewhere classified: Secondary | ICD-10-CM | POA: Diagnosis not present

## 2023-01-26 DIAGNOSIS — M25561 Pain in right knee: Secondary | ICD-10-CM | POA: Diagnosis not present

## 2023-01-26 DIAGNOSIS — R2689 Other abnormalities of gait and mobility: Secondary | ICD-10-CM | POA: Diagnosis not present

## 2023-01-26 DIAGNOSIS — M25661 Stiffness of right knee, not elsewhere classified: Secondary | ICD-10-CM | POA: Diagnosis not present

## 2023-01-26 DIAGNOSIS — Z96651 Presence of right artificial knee joint: Secondary | ICD-10-CM | POA: Diagnosis not present

## 2023-01-28 DIAGNOSIS — M25561 Pain in right knee: Secondary | ICD-10-CM | POA: Diagnosis not present

## 2023-01-28 DIAGNOSIS — Z96651 Presence of right artificial knee joint: Secondary | ICD-10-CM | POA: Diagnosis not present

## 2023-01-28 DIAGNOSIS — M25661 Stiffness of right knee, not elsewhere classified: Secondary | ICD-10-CM | POA: Diagnosis not present

## 2023-01-28 DIAGNOSIS — R2689 Other abnormalities of gait and mobility: Secondary | ICD-10-CM | POA: Diagnosis not present

## 2023-02-02 DIAGNOSIS — M25561 Pain in right knee: Secondary | ICD-10-CM | POA: Diagnosis not present

## 2023-02-02 DIAGNOSIS — M25661 Stiffness of right knee, not elsewhere classified: Secondary | ICD-10-CM | POA: Diagnosis not present

## 2023-02-02 DIAGNOSIS — R2689 Other abnormalities of gait and mobility: Secondary | ICD-10-CM | POA: Diagnosis not present

## 2023-02-02 DIAGNOSIS — Z96651 Presence of right artificial knee joint: Secondary | ICD-10-CM | POA: Diagnosis not present

## 2023-02-04 DIAGNOSIS — R2689 Other abnormalities of gait and mobility: Secondary | ICD-10-CM | POA: Diagnosis not present

## 2023-02-04 DIAGNOSIS — M25661 Stiffness of right knee, not elsewhere classified: Secondary | ICD-10-CM | POA: Diagnosis not present

## 2023-02-04 DIAGNOSIS — M25561 Pain in right knee: Secondary | ICD-10-CM | POA: Diagnosis not present

## 2023-02-04 DIAGNOSIS — Z96651 Presence of right artificial knee joint: Secondary | ICD-10-CM | POA: Diagnosis not present

## 2023-02-09 DIAGNOSIS — M25561 Pain in right knee: Secondary | ICD-10-CM | POA: Diagnosis not present

## 2023-02-09 DIAGNOSIS — Z96651 Presence of right artificial knee joint: Secondary | ICD-10-CM | POA: Diagnosis not present

## 2023-02-09 DIAGNOSIS — R2689 Other abnormalities of gait and mobility: Secondary | ICD-10-CM | POA: Diagnosis not present

## 2023-02-09 DIAGNOSIS — M25661 Stiffness of right knee, not elsewhere classified: Secondary | ICD-10-CM | POA: Diagnosis not present

## 2023-02-16 DIAGNOSIS — M25661 Stiffness of right knee, not elsewhere classified: Secondary | ICD-10-CM | POA: Diagnosis not present

## 2023-02-16 DIAGNOSIS — R2689 Other abnormalities of gait and mobility: Secondary | ICD-10-CM | POA: Diagnosis not present

## 2023-02-16 DIAGNOSIS — Z96651 Presence of right artificial knee joint: Secondary | ICD-10-CM | POA: Diagnosis not present

## 2023-02-16 DIAGNOSIS — M25561 Pain in right knee: Secondary | ICD-10-CM | POA: Diagnosis not present

## 2023-02-23 DIAGNOSIS — M25661 Stiffness of right knee, not elsewhere classified: Secondary | ICD-10-CM | POA: Diagnosis not present

## 2023-02-23 DIAGNOSIS — Z96651 Presence of right artificial knee joint: Secondary | ICD-10-CM | POA: Diagnosis not present

## 2023-02-23 DIAGNOSIS — R2689 Other abnormalities of gait and mobility: Secondary | ICD-10-CM | POA: Diagnosis not present

## 2023-02-23 DIAGNOSIS — M25561 Pain in right knee: Secondary | ICD-10-CM | POA: Diagnosis not present

## 2023-03-04 DIAGNOSIS — Z96651 Presence of right artificial knee joint: Secondary | ICD-10-CM | POA: Diagnosis not present

## 2023-03-04 DIAGNOSIS — M25661 Stiffness of right knee, not elsewhere classified: Secondary | ICD-10-CM | POA: Diagnosis not present

## 2023-03-04 DIAGNOSIS — R2689 Other abnormalities of gait and mobility: Secondary | ICD-10-CM | POA: Diagnosis not present

## 2023-03-04 DIAGNOSIS — M25561 Pain in right knee: Secondary | ICD-10-CM | POA: Diagnosis not present

## 2023-03-16 ENCOUNTER — Encounter: Payer: Self-pay | Admitting: Podiatry

## 2023-03-16 ENCOUNTER — Ambulatory Visit: Payer: Medicare HMO | Admitting: Podiatry

## 2023-03-16 DIAGNOSIS — E1151 Type 2 diabetes mellitus with diabetic peripheral angiopathy without gangrene: Secondary | ICD-10-CM

## 2023-03-16 DIAGNOSIS — B351 Tinea unguium: Secondary | ICD-10-CM

## 2023-03-16 DIAGNOSIS — M79674 Pain in right toe(s): Secondary | ICD-10-CM | POA: Diagnosis not present

## 2023-03-16 DIAGNOSIS — M79675 Pain in left toe(s): Secondary | ICD-10-CM

## 2023-03-18 NOTE — Progress Notes (Signed)
ANNUAL DIABETIC FOOT EXAM  Subjective: Lynn Wood presents today annual diabetic foot exam.  Chief Complaint  Patient presents with   Nail Problem    Nail trim    Patient confirms h/o diabetes.  Patient denies any h/o foot wounds.  She has h/o PAD.  Patient denies any numbness, tingling, burning, or pins/needle sensation in feet.  Risk factors: diabetes, PAD with intermittent claudication, HTN, hypercholesterolemia, current tobacco user.  Marina Goodell, MD is patient's PCP.  Past Medical History:  Diagnosis Date   Arthritis    Benign neoplasm of colon    Breast cancer (HCC) 08/2016   left   Cancer (HCC) 06/2016   left breast   Carpal tunnel syndrome    DCIS (ductal carcinoma in situ) of breast    Diabetes mellitus without complication (HCC)    Hypertension    Nontoxic uninodular goiter    Pre-diabetes    Pure hypercholesterolemia    Vaginal vault prolapse    Vitamin D deficiency    Patient Active Problem List   Diagnosis Date Noted   S/P TKR (total knee replacement) using cement, right 02/10/2022   Atherosclerosis of native arteries of extremity with intermittent claudication (HCC) 04/19/2018   PAD (peripheral artery disease) (HCC) 04/19/2018   Other hyperlipidemia 12/10/2017   Left leg numbness 11/22/2017   DCIS (ductal carcinoma in situ) 07/28/2016   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   Primary osteoarthritis of left knee 11/05/2015   Diabetes mellitus type 2, uncomplicated (HCC) 01/02/2014   Vitamin D deficiency 01/02/2014   Nontoxic uninodular goiter 10/31/2013   Benign neoplasm of colon 08/30/2013   Carpal tunnel syndrome 08/30/2013   Benign essential hypertension 08/30/2013   Pure hypercholesterolemia 08/30/2013   Vaginal vault prolapse 08/30/2013   Past Surgical History:  Procedure Laterality Date   COLONOSCOPY WITH PROPOFOL N/A 01/22/2015   Procedure: COLONOSCOPY WITH PROPOFOL;  Surgeon: Christena Deem,  MD;  Location: [facility] ENDOSCOPY;  Service: Endoscopy;  Laterality: N/A;   COLONOSCOPY WITH PROPOFOL N/A 05/07/2020   Procedure: COLONOSCOPY WITH PROPOFOL;  Surgeon: Regis Bill, MD;  Location: [facility] ENDOSCOPY;  Service: Endoscopy;  Laterality: N/A;   GYNECOLOGIC CRYOSURGERY     INCISION AND DRAINAGE ABSCESS Left 07/29/2016   Procedure: Reexploration of left breast mastectomy site;  Surgeon: Leafy Ro, MD;  Location: [facility] ORS;  Service: General;  Laterality: Left;   LOWER EXTREMITY ANGIOGRAPHY Left 05/19/2018   Procedure: LOWER EXTREMITY ANGIOGRAPHY;  Surgeon: Annice Needy, MD;  Location: [facility] INVASIVE CV LAB;  Service: Cardiovascular;  Laterality: Left;   MASTECTOMY Left    March 2018   MASTECTOMY W/ SENTINEL NODE BIOPSY Left 07/28/2016   Procedure: MASTECTOMY WITH SENTINEL LYMPH NODE BIOPSY;  Surgeon: Leafy Ro, MD;  Location: [facility] ORS;  Service: General;  Laterality: Left;   MULTIPLE TOOTH EXTRACTIONS  1997   TOTAL KNEE ARTHROPLASTY Right 02/10/2022   Procedure: TOTAL KNEE ARTHROPLASTY;  Surgeon: Lyndle Herrlich, MD;  Location: [facility] ORS;  Service: Orthopedics;  Laterality: Right;   TUBAL LIGATION     Current Outpatient Medications on File Prior to Visit  Medication Sig Dispense Refill   aspirin 81 MG chewable tablet Chew 1 tablet (81 mg total) by mouth 2 (two) times daily. 60 tablet 0   atenolol (TENORMIN) 50 MG tablet Take 50 mg by mouth at bedtime.     Cholecalciferol (VITAMIN D) 2000 units CAPS Take 1 capsule by mouth daily.  docusate sodium (COLACE) 100 MG capsule Take 1 capsule (100 mg total) by mouth 2 (two) times daily. (Patient not taking: Reported on 10/26/2022) 30 capsule 0   fluticasone (FLONASE) 50 MCG/ACT nasal spray Place 1 spray into both nostrils daily as needed for allergies.      HYDROcodone-acetaminophen (NORCO/VICODIN) 5-325 MG tablet Take 1 tablet by mouth every 4 (four) hours as needed for moderate pain (pain score 4-6). 30 tablet 0   lisinopril  (PRINIVIL,ZESTRIL) 20 MG tablet Take 20 mg by mouth at bedtime.     methocarbamol (ROBAXIN-750) 750 MG tablet Take 1 tablet (750 mg total) by mouth every 8 (eight) hours as needed for muscle spasms. (Patient not taking: Reported on 10/26/2022) 60 tablet 0   Omega-3 Fatty Acids (FISH OIL PO) Take 1 capsule by mouth every other day.     No current facility-administered medications on file prior to visit.    Allergies  Allergen Reactions   Lipitor [Atorvastatin] Other (See Comments)    Arthalgia   Lipofen [Fenofibrate] Other (See Comments)    Arthalgia   Mevacor [Lovastatin] Other (See Comments)    Arthalgia   Zetia [Ezetimibe] Other (See Comments)    Arthalgia   Zocor [Simvastatin] Other (See Comments)    Arthalgia   Social History   Occupational History   Occupation: retired  Tobacco Use   Smoking status: Light Smoker    Current packs/day: 0.25    Types: Cigarettes   Smokeless tobacco: Never   Tobacco comments:    1 cigarette every 1-2 weeks  Vaping Use   Vaping status: Never Used  Substance and Sexual Activity   Alcohol use: No   Drug use: No   Sexual activity: Not on file   Family History  Problem Relation Age of Onset   Breast cancer Cousin        mat cousin   Colon cancer Mother    Prostate cancer Father    Prostate cancer Brother    Heart disease Daughter    Immunization History  Administered Date(s) Administered   Influenza,inj,Quad PF,6+ Mos 07/29/2016   PFIZER Comirnaty(Gray Top)Covid-19 Tri-Sucrose Vaccine 05/15/2020, 10/15/2020   PFIZER(Purple Top)SARS-COV-2 Vaccination 07/06/2019, 07/27/2019   Pneumococcal Polysaccharide-23 01/24/2020    Review of Systems: Negative except as noted in the HPI.   Objective: There were no vitals filed for this visit.  Lynn Wood is a pleasant 77 y.o. female in NAD. AAO X 3.  Vascular Examination: CFT <3 seconds b/l. DP/PT pulses faintly palpable b/l. Skin temperature gradient warm to warm b/l. No pain with  calf compression. No ischemia or gangrene. No cyanosis or clubbing noted b/l. Pedal hair absent.   Neurological Examination: Sensation grossly intact b/l with 10 gram monofilament. Vibratory sensation intact b/l.   Dermatological Examination: Pedal skin warm and supple b/l.   No open wounds. No interdigital macerations.  Toenails 1-5 b/l thick, discolored, elongated with subungual debris and pain on dorsal palpation.    Porokeratotic lesion(s) submet head 1 right, x 2 right foot submet head 2 left foot. No erythema, no edema, no drainage, no fluctuance.  Musculoskeletal Examination: Muscle strength 5/5 to b/l LE. Hallux valgus with bunion deformity noted right lower extremity.  Radiographs: None     ADA Risk Categorization: High Risk  Patient has one or more of the following: Loss of protective sensation Absent pedal pulses Severe Foot deformity History of foot ulcer  Assessment: No diagnosis found.   Plan: -Patient was evaluated and treated. All patient's  and/or POA's questions/concerns answered on today's visit. -Diabetic foot examination performed today. -Discussed and educated patient on diabetic foot care, especially with  regards to the vascular, neurological and musculoskeletal systems. -Continue diabetic foot care principles: inspect feet daily, monitor glucose as recommended by PCP and/or Endocrinologist, and follow prescribed diet per PCP, Endocrinologist and/or dietician. -Toenails 1-5 b/l were debrided in length and girth with sterile nail nippers and dremel without iatrogenic bleeding.  -Porokeratotic lesion(s) x 4 as above bilaterally pared and enucleated with sterile currette without incident. Total number of lesions debrided=4. -Patient/POA to call should there be question/concern in the interim. Return in about 3 months (around 06/16/2023) for RFC.  Candelaria Stagers, DPM

## 2023-04-06 DIAGNOSIS — G5603 Carpal tunnel syndrome, bilateral upper limbs: Secondary | ICD-10-CM | POA: Diagnosis not present

## 2023-04-14 DIAGNOSIS — F17200 Nicotine dependence, unspecified, uncomplicated: Secondary | ICD-10-CM | POA: Diagnosis not present

## 2023-04-14 DIAGNOSIS — I10 Essential (primary) hypertension: Secondary | ICD-10-CM | POA: Diagnosis not present

## 2023-04-14 DIAGNOSIS — E785 Hyperlipidemia, unspecified: Secondary | ICD-10-CM | POA: Diagnosis not present

## 2023-04-14 DIAGNOSIS — G5601 Carpal tunnel syndrome, right upper limb: Secondary | ICD-10-CM | POA: Diagnosis not present

## 2023-04-14 DIAGNOSIS — Z972 Presence of dental prosthetic device (complete) (partial): Secondary | ICD-10-CM | POA: Diagnosis not present

## 2023-04-14 DIAGNOSIS — Z716 Tobacco abuse counseling: Secondary | ICD-10-CM | POA: Diagnosis not present

## 2023-04-14 DIAGNOSIS — E1151 Type 2 diabetes mellitus with diabetic peripheral angiopathy without gangrene: Secondary | ICD-10-CM | POA: Diagnosis not present

## 2023-04-28 DIAGNOSIS — E119 Type 2 diabetes mellitus without complications: Secondary | ICD-10-CM | POA: Diagnosis not present

## 2023-04-28 DIAGNOSIS — G5602 Carpal tunnel syndrome, left upper limb: Secondary | ICD-10-CM | POA: Diagnosis not present

## 2023-04-28 DIAGNOSIS — I10 Essential (primary) hypertension: Secondary | ICD-10-CM | POA: Diagnosis not present

## 2023-04-28 DIAGNOSIS — Z79899 Other long term (current) drug therapy: Secondary | ICD-10-CM | POA: Diagnosis not present

## 2023-04-28 DIAGNOSIS — E785 Hyperlipidemia, unspecified: Secondary | ICD-10-CM | POA: Diagnosis not present

## 2023-04-28 DIAGNOSIS — Z7982 Long term (current) use of aspirin: Secondary | ICD-10-CM | POA: Diagnosis not present

## 2023-05-04 DIAGNOSIS — Z135 Encounter for screening for eye and ear disorders: Secondary | ICD-10-CM | POA: Diagnosis not present

## 2023-05-04 DIAGNOSIS — H259 Unspecified age-related cataract: Secondary | ICD-10-CM | POA: Diagnosis not present

## 2023-05-04 DIAGNOSIS — H5203 Hypermetropia, bilateral: Secondary | ICD-10-CM | POA: Diagnosis not present

## 2023-05-04 DIAGNOSIS — H524 Presbyopia: Secondary | ICD-10-CM | POA: Diagnosis not present

## 2023-05-04 DIAGNOSIS — H52223 Regular astigmatism, bilateral: Secondary | ICD-10-CM | POA: Diagnosis not present

## 2023-05-04 DIAGNOSIS — H353131 Nonexudative age-related macular degeneration, bilateral, early dry stage: Secondary | ICD-10-CM | POA: Diagnosis not present

## 2023-05-04 DIAGNOSIS — H04123 Dry eye syndrome of bilateral lacrimal glands: Secondary | ICD-10-CM | POA: Diagnosis not present

## 2023-09-01 ENCOUNTER — Other Ambulatory Visit (INDEPENDENT_AMBULATORY_CARE_PROVIDER_SITE_OTHER): Payer: Self-pay | Admitting: Nurse Practitioner

## 2023-09-01 DIAGNOSIS — Z9889 Other specified postprocedural states: Secondary | ICD-10-CM

## 2023-09-03 ENCOUNTER — Ambulatory Visit (INDEPENDENT_AMBULATORY_CARE_PROVIDER_SITE_OTHER): Payer: Medicare PPO | Admitting: Vascular Surgery

## 2023-09-03 ENCOUNTER — Encounter (INDEPENDENT_AMBULATORY_CARE_PROVIDER_SITE_OTHER): Payer: Medicare PPO

## 2023-11-16 ENCOUNTER — Telehealth (INDEPENDENT_AMBULATORY_CARE_PROVIDER_SITE_OTHER): Payer: Self-pay

## 2023-11-16 NOTE — Telephone Encounter (Signed)
 We can see her sooner with her scheduled visit but pain may not be from blood flow but it could also be nerve related from her stroke

## 2023-11-16 NOTE — Telephone Encounter (Signed)
 Pt's husband states that she had a stroke about 2 months and was having left leg pain in the hospital husband states that they put lidocaine  patches on the leg but never figured out what was actually going.  Since pt has been home she has been back to the ER in [place] on 11/10/2023 for the same symptoms and still got no answers (Paperwork from ER says Ischemic Rest Pain) She was prescribed oxycodone  5.5mg  and she takes 1/2 pill at bedtime with no relief.  Pt's husband states she has severe leg pain at night, no swelling, sometimes it feels hot to the touch, no color change that he can tell.  He states laying down seems to make it much worse.  Pt has an appt in July but her husband just does not think she can wait that long.  Please advise

## 2023-11-25 ENCOUNTER — Ambulatory Visit (INDEPENDENT_AMBULATORY_CARE_PROVIDER_SITE_OTHER)

## 2023-11-25 DIAGNOSIS — Z9889 Other specified postprocedural states: Secondary | ICD-10-CM | POA: Diagnosis not present

## 2023-11-25 DIAGNOSIS — I739 Peripheral vascular disease, unspecified: Secondary | ICD-10-CM | POA: Diagnosis not present

## 2023-11-26 LAB — VAS US ABI WITH/WO TBI
Left ABI: 0.81
Right ABI: 0.35

## 2023-11-30 ENCOUNTER — Encounter (INDEPENDENT_AMBULATORY_CARE_PROVIDER_SITE_OTHER): Payer: Self-pay | Admitting: Vascular Surgery

## 2023-11-30 ENCOUNTER — Ambulatory Visit (INDEPENDENT_AMBULATORY_CARE_PROVIDER_SITE_OTHER): Admitting: Vascular Surgery

## 2023-11-30 ENCOUNTER — Telehealth (INDEPENDENT_AMBULATORY_CARE_PROVIDER_SITE_OTHER): Payer: Self-pay

## 2023-11-30 VITALS — BP 145/68 | HR 52 | Resp 16 | Ht 64.0 in | Wt 163.8 lb

## 2023-11-30 DIAGNOSIS — E119 Type 2 diabetes mellitus without complications: Secondary | ICD-10-CM | POA: Diagnosis not present

## 2023-11-30 DIAGNOSIS — I10 Essential (primary) hypertension: Secondary | ICD-10-CM

## 2023-11-30 DIAGNOSIS — I70213 Atherosclerosis of native arteries of extremities with intermittent claudication, bilateral legs: Secondary | ICD-10-CM

## 2023-11-30 DIAGNOSIS — I70221 Atherosclerosis of native arteries of extremities with rest pain, right leg: Secondary | ICD-10-CM

## 2023-11-30 DIAGNOSIS — E7849 Other hyperlipidemia: Secondary | ICD-10-CM

## 2023-11-30 NOTE — Assessment & Plan Note (Signed)
lipid control important in reducing the progression of atherosclerotic disease. Statin intolerant

## 2023-11-30 NOTE — Assessment & Plan Note (Signed)
 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  Her ABIs had a marked drop from nearly normal down to 0.35 on her study a few days ago.  Her symptoms have now been going on for 2 or 3 months.  She has pain nearly constantly and has to dangle her foot at all times.  We discussed she needs to have an angiogram with possible revascularization.  We discussed this is clearly a critical and limb threatening situation.  I discussed the risks and benefits of the procedure.  She and her husband voiced their understanding and desire to proceed as soon as possible.

## 2023-11-30 NOTE — Telephone Encounter (Signed)
 I attempted to contact the patient to schedule her for a right leg angio with Dr. Vonna Guardian on 12/02/23. I was unable to leave a message on the home phone as it just rings, mobile the voicemail box is full and the husband has the same numbers.

## 2023-11-30 NOTE — H&P (View-Only) (Signed)
 MRN : [id number]  Lynn Wood is a 78 y.o. (Jul 04, 1945) female who presents with chief complaint of  Chief Complaint  Patient presents with   Follow-up    1 year follow up with ABI  .  History of Present Illness: Patient returns today in follow up of her PAD.  About 3 months ago, she began with abrupt onset of severe right foot and lower leg pain.  She essentially had to dangle her foot at all times.  About 1 week later, she had a stroke and was in the hospital for 2 weeks.  She was still complaining of right foot pain.  This has not gotten any better.  She has been on aspirin  only.  She has a previous history of left lower extremity revascularization about 5 or 6 years ago.  Her left leg seems to be doing fine.  She is having swelling as well as the pain in her right foot and lower leg.  She can really only walk a few steps.  She recently had ABIs that showed a right ABI that had a dramatic drop from 0.81 down to 0.35.  Left ABI remained nearly normal at 0.87.  Current Outpatient Medications  Medication Sig Dispense Refill   aspirin  81 MG chewable tablet Chew 1 tablet (81 mg total) by mouth 2 (two) times daily. 60 tablet 0   atenolol  (TENORMIN ) 50 MG tablet Take 50 mg by mouth at bedtime.     Cholecalciferol (VITAMIN D) 2000 units CAPS Take 1 capsule by mouth daily.     docusate sodium  (COLACE) 100 MG capsule Take 1 capsule (100 mg total) by mouth 2 (two) times daily. 30 capsule 0   fluticasone (FLONASE) 50 MCG/ACT nasal spray Place 1 spray into both nostrils daily as needed for allergies.      HYDROcodone -acetaminophen  (NORCO/VICODIN) 5-325 MG tablet Take 1 tablet by mouth every 4 (four) hours as needed for moderate pain (pain score 4-6). 30 tablet 0   lisinopril  (PRINIVIL ,ZESTRIL ) 20 MG tablet Take 20 mg by mouth at bedtime.     methocarbamol  (ROBAXIN -750) 750 MG tablet Take 1 tablet (750 mg total) by mouth every 8 (eight) hours as needed for muscle spasms. 60 tablet 0   Omega-3  Fatty Acids (FISH OIL PO) Take 1 capsule by mouth every other day.     No current facility-administered medications for this visit.    Past Medical History:  Diagnosis Date   Arthritis    Benign neoplasm of colon    Breast cancer (HCC) 08/2016   left   Cancer (HCC) 06/2016   left breast   Carpal tunnel syndrome    DCIS (ductal carcinoma in situ) of breast    Diabetes mellitus without complication (HCC)    Hypertension    Nontoxic uninodular goiter    Pre-diabetes    Pure hypercholesterolemia    Vaginal vault prolapse    Vitamin D deficiency     Past Surgical History:  Procedure Laterality Date   COLONOSCOPY WITH PROPOFOL  N/A 01/22/2015   Procedure: COLONOSCOPY WITH PROPOFOL ;  Surgeon: Deveron Fly, MD;  Location: [facility] ENDOSCOPY;  Service: Endoscopy;  Laterality: N/A;   COLONOSCOPY WITH PROPOFOL  N/A 05/07/2020   Procedure: COLONOSCOPY WITH PROPOFOL ;  Surgeon: Shane Darling, MD;  Location: [facility] ENDOSCOPY;  Service: Endoscopy;  Laterality: N/A;   GYNECOLOGIC CRYOSURGERY     INCISION AND DRAINAGE ABSCESS Left 07/29/2016   Procedure: Reexploration of left breast mastectomy site;  Surgeon: Alben Alma,  MD;  Location: [facility] ORS;  Service: General;  Laterality: Left;   LOWER EXTREMITY ANGIOGRAPHY Left 05/19/2018   Procedure: LOWER EXTREMITY ANGIOGRAPHY;  Surgeon: Celso College, MD;  Location: [facility] INVASIVE CV LAB;  Service: Cardiovascular;  Laterality: Left;   MASTECTOMY Left    March 2018   MASTECTOMY W/ SENTINEL NODE BIOPSY Left 07/28/2016   Procedure: MASTECTOMY WITH SENTINEL LYMPH NODE BIOPSY;  Surgeon: Alben Alma, MD;  Location: [facility] ORS;  Service: General;  Laterality: Left;   MULTIPLE TOOTH EXTRACTIONS  1997   TOTAL KNEE ARTHROPLASTY Right 02/10/2022   Procedure: TOTAL KNEE ARTHROPLASTY;  Surgeon: Jerlyn Moons, MD;  Location: [facility] ORS;  Service: Orthopedics;  Laterality: Right;   TUBAL LIGATION       Social History   Tobacco Use   Smoking status: Light  Smoker    Current packs/day: 0.25    Types: Cigarettes   Smokeless tobacco: Never   Tobacco comments:    1 cigarette every 1-2 weeks  Vaping Use   Vaping status: Never Used  Substance Use Topics   Alcohol use: No   Drug use: No      Family History  Problem Relation Age of Onset   Breast cancer Cousin        mat cousin   Colon cancer Mother    Prostate cancer Father    Prostate cancer Brother    Heart disease Daughter      Allergies  Allergen Reactions   Lipitor [Atorvastatin] Other (See Comments)    Arthalgia   Lipofen [Fenofibrate] Other (See Comments)    Arthalgia   Mevacor [Lovastatin] Other (See Comments)    Arthalgia   Zetia [Ezetimibe] Other (See Comments)    Arthalgia   Zocor [Simvastatin] Other (See Comments)    Arthalgia      REVIEW OF SYSTEMS (Negative unless checked)   Constitutional: [] Weight loss  [] Fever  [] Chills Cardiac: [] Chest pain   [] Chest pressure   [] Palpitations   [] Shortness of breath when laying flat   [] Shortness of breath at rest   [] Shortness of breath with exertion. Vascular:  [x] Pain in legs with walking   [] Pain in legs at rest   [] Pain in legs when laying flat   [x] Claudication   [] Pain in feet when walking  [] Pain in feet at rest  [] Pain in feet when laying flat   [] History of DVT   [] Phlebitis   [] Swelling in legs   [] Varicose veins   [] Non-healing ulcers Pulmonary:   [] Uses home oxygen   [] Productive cough   [] Hemoptysis   [] Wheeze  [] COPD   [] Asthma Neurologic:  [] Dizziness  [] Blackouts   [] Seizures   [] History of stroke   [] History of TIA  [] Aphasia   [] Temporary blindness   [] Dysphagia   [] Weakness or numbness in arms   [] Weakness or numbness in legs Musculoskeletal:  [x] Arthritis   [x] Joint swelling   [x] Joint pain   [] Low back pain Hematologic:  [] Easy bruising  [] Easy bleeding   [] Hypercoagulable state   [] Anemic   Gastrointestinal:  [] Blood in stool   [] Vomiting blood  [x] Gastroesophageal reflux/heartburn   [] Abdominal  pain Genitourinary:  [] Chronic kidney disease   [] Difficult urination  [] Frequent urination  [] Burning with urination   [] Hematuria Skin:  [] Rashes   [] Ulcers   [] Wounds Psychological:  [] History of anxiety   []  History of major depression.  Physical Examination  BP (!) 145/68 (BP Location: Left Arm, Patient Position: Sitting, Cuff Size: Normal)   Pulse (!) 52  Resp 16   Ht 5' 4 (1.626 m)   Wt 163 lb 12.8 oz (74.3 kg)   BMI 28.12 kg/m  Gen:  WD/WN, NAD Head: [place]/AT, No temporalis wasting. Ear/Nose/Throat: Hearing grossly intact, nares w/o erythema or drainage Eyes: Conjunctiva clear. Sclera non-icteric Neck: Supple.  Trachea midline Pulmonary:  Good air movement, no use of accessory muscles.  Cardiac: bradycardic Vascular:  Vessel Right Left  Radial Palpable Palpable                          PT Not Palpable 1+ Palpable  DP Not Palpable 2+ Palpable   Gastrointestinal: soft, non-tender/non-distended. No guarding/reflex.  Musculoskeletal: M/S 5/5 throughout.  No deformity or atrophy. Right foot is cool and has sluggish capillary refill. 1+ RLE edema. Neurologic: Sensation grossly intact in extremities.  Symmetrical.  Speech is fluent.  Psychiatric: Judgment intact, Mood & affect appropriate for pt's clinical situation. Dermatologic: No rashes or ulcers noted.  No cellulitis or open wounds.      Labs Recent Results (from the past 2160 hours)  VAS US  ABI WITH/WO TBI     Status: None   Collection Time: 11/25/23 11:01 AM  Result Value Ref Range   Right ABI 0.35    Left ABI 0.81     Radiology VAS US  ABI WITH/WO TBI Result Date: 11/26/2023  LOWER EXTREMITY DOPPLER STUDY Patient Name:  Lynn Wood  Date of Exam:   11/25/2023 Medical Rec #: [id number]              Accession #:    [id number] Date of Birth: 06/09/1946              Patient Gender: F Patient Age:   58 years Exam Location:  [facility] Procedure:      VAS US  ABI WITH/WO TBI Referring Phys:  Sharla Davis --------------------------------------------------------------------------------  Indications: Claudication, and peripheral artery disease. High Risk Factors: Hypertension, hyperlipidemia, current smoker. Other Factors: RIght leg edema and pain.  Vascular Interventions: 05/19/2018: Angiogram and Selective Angiogram, PTA's of                         Lt ATA and Distal Popliteal Artery. PTA's of the Left                         SFA and Popliteal Artery. Performing Technologist: Oneta Bilberry RVT  Examination Guidelines: A complete evaluation includes at minimum, Doppler waveform signals and systolic blood pressure reading at the level of bilateral brachial, anterior tibial, and posterior tibial arteries, when vessel segments are accessible. Bilateral testing is considered an integral part of a complete examination. Photoelectric Plethysmograph (PPG) waveforms and toe systolic pressure readings are included as required and additional duplex testing as needed. Limited examinations for reoccurring indications may be performed as noted.  ABI Findings: +---------+------------------+-----+----------+--------+ Right    Rt Pressure (mmHg)IndexWaveform  Comment  +---------+------------------+-----+----------+--------+ Brachial 147                                       +---------+------------------+-----+----------+--------+ PTA      51                0.35 monophasic         +---------+------------------+-----+----------+--------+ DP       50  0.34 monophasic         +---------+------------------+-----+----------+--------+ Great Toe0                 0.00                    +---------+------------------+-----+----------+--------+ +---------+------------------+-----+----------+-------+ Left     Lt Pressure (mmHg)IndexWaveform  Comment +---------+------------------+-----+----------+-------+ Brachial 143                                       +---------+------------------+-----+----------+-------+ PTA      119               0.81 monophasic        +---------+------------------+-----+----------+-------+ DP       106               0.72 biphasic          +---------+------------------+-----+----------+-------+ Great Toe67                0.46                   +---------+------------------+-----+----------+-------+ +-------+-----------+-----------+------------+------------+ ABI/TBIToday's ABIToday's TBIPrevious ABIPrevious TBI +-------+-----------+-----------+------------+------------+ Right  0.35       0.00       0.81        0.52         +-------+-----------+-----------+------------+------------+ Left   0.81       0.46       0.87        0.60         +-------+-----------+-----------+------------+------------+  Right ABIs appear decreased compared to prior study on 08/28/2022. Left ABIs appear essentially unchanged compared to prior study on 08/28/2022.  Summary: Right: Resting right ankle-brachial index indicates severe right lower extremity arterial disease. The right toe-brachial index is abnormal. Limited imaging showed >50% Popliteal artery stenosis. Left: Resting left ankle-brachial index indicates mild left lower extremity arterial disease. The left toe-brachial index is abnormal. *See table(s) above for measurements and observations.  Electronically signed by Mikki Alexander MD on 11/26/2023 at 7:13:10 AM.    Final     Assessment/Plan  Other hyperlipidemia lipid control important in reducing the progression of atherosclerotic disease. Statin intolerant   Atherosclerosis of native arteries of extremity with rest pain [facility]) The patient has critical and limb threatening ischemia with severe rest pain.  Her ABIs had a marked drop from nearly normal down to 0.35 on her study a few days ago.  Her symptoms have now been going on for 2 or 3 months.  She has pain nearly constantly and has to dangle her foot at all times.   We discussed she needs to have an angiogram with possible revascularization.  We discussed this is clearly a critical and limb threatening situation.  I discussed the risks and benefits of the procedure.  She and her husband voiced their understanding and desire to proceed as soon as possible.  Benign essential hypertension blood pressure control important in reducing the progression of atherosclerotic disease. On appropriate oral medications.     Diabetes mellitus type 2, uncomplicated (HCC) blood glucose control important in reducing the progression of atherosclerotic disease. Also, involved in wound healing. On appropriate medications.  Mikki Alexander, MD  11/30/2023 1:58 PM    This note was created with Dragon medical transcription system.  Any errors from dictation are purely unintentional

## 2023-11-30 NOTE — Patient Instructions (Signed)
Angiogram, Care After After the procedure, it is common to have: Bruising and tenderness at the catheter insertion area. A collection of blood (hematoma) at the insertion area. This may feel like a small lump under the skin at the insertion site. Follow these instructions at home: Insertion site care  Follow instructions from your health care provider about how to take care of your insertion site. Make sure you: Wash your hands with soap and water for at least 20 seconds before and after you change your bandage (dressing). If soap and water are not available, use hand sanitizer. Change your dressing as told by your health care provider. Do not take baths, swim, or use a hot tub until your health care provider approves. You may shower 24-48 hours after the procedure, or as told by your health care provider. To clean the insertion site: Gently wash the area with plain soap and water. Pat the area dry with a clean towel. Do not rub the site. This may cause bleeding. Do not apply powder or lotion to the site. Keep the site clean and dry. Check your insertion site every day for signs of infection. Check for: Redness, swelling, or pain. Fluid or blood. Warmth. Pus or a bad smell. Activity If you were given a sedative during the procedure, it can affect you for several hours. Do not drive or operate machinery until your health care provider says that it is safe. Rest as told by your health care provider. This is usually for 1-2 days. If the catheter was inserted through your leg, avoid walking up or down the stairs for a few days. If the catheter was inserted through your wrist, avoid repetitive hand and wrist movement for a few days. Return to your normal activities as told by your health care provider, usually in about a week. Ask your health care provider what activities are safe for you. General instructions If your insertion site starts to bleed, lie flat and put pressure on the site. If  the bleeding does not stop, get help right away. This is a medical emergency. Take over-the-counter and prescription medicines only as told by your health care provider. Drink enough fluid to keep your urine pale yellow. This helps flush the contrast dye from your body. Keep all follow-up visits for continued treatment and for your safety. Contact a health care provider if: You have a fever or chills. You have any signs of infection at the insertion site. You have slight bleeding from the insertion area. Hold pressure on the area. Get help right away if: You have a problem in the insertion area, such as: Severe pain, rapid swelling, or bleeding that does not stop when you apply firm pressure to the area. Fluid, pus or a bad smell coming from your insertion site. The insertion site or limb becomes pale, cool, tingly, or numb. Pain in the limb of the insertion site. You have chest pain. You have trouble breathing. You have any symptoms of a stroke. "BE FAST" is an easy way to remember the main warning signs of a stroke: B - Balance. Signs are dizziness, sudden trouble walking, or loss of balance. E - Eyes. Signs are trouble seeing or a sudden change in vision. F - Face. Signs are sudden weakness or loss of feeling of the face, or the face or eyelid drooping on one side. A - Arms. Signs are weakness or loss of feeling in an arm. This happens suddenly and usually on one side of the  body. S - Speech. Signs are sudden trouble speaking, slurred speech, or trouble understanding what people say. T - Time. Time to call emergency services. Write down what time symptoms started. You have other signs of a stroke, such as: A sudden, severe headache with no known cause. Nausea or vomiting. Seizure. You have severe pain in your hand or leg. These symptoms may be an emergency. Get help right away. Call 911. Do not wait to see if the symptoms will go away. Do not drive yourself to the hospital. This  information is not intended to replace advice given to you by your health care provider. Make sure you discuss any questions you have with your health care provider. Document Revised: 12/24/2021 Document Reviewed: 12/24/2021 Elsevier Patient Education  2024 [employer].

## 2023-11-30 NOTE — Progress Notes (Signed)
 MRN : [id number]  Lynn Wood is a 78 y.o. (1946/04/01) female who presents with chief complaint of  Chief Complaint  Patient presents with   Follow-up    1 year follow up with ABI  .  History of Present Illness: Patient returns today in follow up of her PAD.  About 3 months ago, she began with abrupt onset of severe right foot and lower leg pain.  She essentially had to dangle her foot at all times.  About 1 week later, she had a stroke and was in the hospital for 2 weeks.  She was still complaining of right foot pain.  This has not gotten any better.  She has been on aspirin  only.  She has a previous history of left lower extremity revascularization about 5 or 6 years ago.  Her left leg seems to be doing fine.  She is having swelling as well as the pain in her right foot and lower leg.  She can really only walk a few steps.  She recently had ABIs that showed a right ABI that had a dramatic drop from 0.81 down to 0.35.  Left ABI remained nearly normal at 0.87.  Current Outpatient Medications  Medication Sig Dispense Refill   aspirin  81 MG chewable tablet Chew 1 tablet (81 mg total) by mouth 2 (two) times daily. 60 tablet 0   atenolol  (TENORMIN ) 50 MG tablet Take 50 mg by mouth at bedtime.     Cholecalciferol (VITAMIN D) 2000 units CAPS Take 1 capsule by mouth daily.     docusate sodium  (COLACE) 100 MG capsule Take 1 capsule (100 mg total) by mouth 2 (two) times daily. 30 capsule 0   fluticasone (FLONASE) 50 MCG/ACT nasal spray Place 1 spray into both nostrils daily as needed for allergies.      HYDROcodone -acetaminophen  (NORCO/VICODIN) 5-325 MG tablet Take 1 tablet by mouth every 4 (four) hours as needed for moderate pain (pain score 4-6). 30 tablet 0   lisinopril  (PRINIVIL ,ZESTRIL ) 20 MG tablet Take 20 mg by mouth at bedtime.     methocarbamol  (ROBAXIN -750) 750 MG tablet Take 1 tablet (750 mg total) by mouth every 8 (eight) hours as needed for muscle spasms. 60 tablet 0   Omega-3  Fatty Acids (FISH OIL PO) Take 1 capsule by mouth every other day.     No current facility-administered medications for this visit.    Past Medical History:  Diagnosis Date   Arthritis    Benign neoplasm of colon    Breast cancer (HCC) 08/2016   left   Cancer (HCC) 06/2016   left breast   Carpal tunnel syndrome    DCIS (ductal carcinoma in situ) of breast    Diabetes mellitus without complication (HCC)    Hypertension    Nontoxic uninodular goiter    Pre-diabetes    Pure hypercholesterolemia    Vaginal vault prolapse    Vitamin D deficiency     Past Surgical History:  Procedure Laterality Date   COLONOSCOPY WITH PROPOFOL  N/A 01/22/2015   Procedure: COLONOSCOPY WITH PROPOFOL ;  Surgeon: Deveron Fly, MD;  Location: [facility] ENDOSCOPY;  Service: Endoscopy;  Laterality: N/A;   COLONOSCOPY WITH PROPOFOL  N/A 05/07/2020   Procedure: COLONOSCOPY WITH PROPOFOL ;  Surgeon: Shane Darling, MD;  Location: [facility] ENDOSCOPY;  Service: Endoscopy;  Laterality: N/A;   GYNECOLOGIC CRYOSURGERY     INCISION AND DRAINAGE ABSCESS Left 07/29/2016   Procedure: Reexploration of left breast mastectomy site;  Surgeon: Alben Alma,  MD;  Location: [facility] ORS;  Service: General;  Laterality: Left;   LOWER EXTREMITY ANGIOGRAPHY Left 05/19/2018   Procedure: LOWER EXTREMITY ANGIOGRAPHY;  Surgeon: Celso College, MD;  Location: [facility] INVASIVE CV LAB;  Service: Cardiovascular;  Laterality: Left;   MASTECTOMY Left    March 2018   MASTECTOMY W/ SENTINEL NODE BIOPSY Left 07/28/2016   Procedure: MASTECTOMY WITH SENTINEL LYMPH NODE BIOPSY;  Surgeon: Alben Alma, MD;  Location: [facility] ORS;  Service: General;  Laterality: Left;   MULTIPLE TOOTH EXTRACTIONS  1997   TOTAL KNEE ARTHROPLASTY Right 02/10/2022   Procedure: TOTAL KNEE ARTHROPLASTY;  Surgeon: Jerlyn Moons, MD;  Location: [facility] ORS;  Service: Orthopedics;  Laterality: Right;   TUBAL LIGATION       Social History   Tobacco Use   Smoking status: Light  Smoker    Current packs/day: 0.25    Types: Cigarettes   Smokeless tobacco: Never   Tobacco comments:    1 cigarette every 1-2 weeks  Vaping Use   Vaping status: Never Used  Substance Use Topics   Alcohol use: No   Drug use: No      Family History  Problem Relation Age of Onset   Breast cancer Cousin        mat cousin   Colon cancer Mother    Prostate cancer Father    Prostate cancer Brother    Heart disease Daughter      Allergies  Allergen Reactions   Lipitor [Atorvastatin] Other (See Comments)    Arthalgia   Lipofen [Fenofibrate] Other (See Comments)    Arthalgia   Mevacor [Lovastatin] Other (See Comments)    Arthalgia   Zetia [Ezetimibe] Other (See Comments)    Arthalgia   Zocor [Simvastatin] Other (See Comments)    Arthalgia      REVIEW OF SYSTEMS (Negative unless checked)   Constitutional: [] Weight loss  [] Fever  [] Chills Cardiac: [] Chest pain   [] Chest pressure   [] Palpitations   [] Shortness of breath when laying flat   [] Shortness of breath at rest   [] Shortness of breath with exertion. Vascular:  [x] Pain in legs with walking   [] Pain in legs at rest   [] Pain in legs when laying flat   [x] Claudication   [] Pain in feet when walking  [] Pain in feet at rest  [] Pain in feet when laying flat   [] History of DVT   [] Phlebitis   [] Swelling in legs   [] Varicose veins   [] Non-healing ulcers Pulmonary:   [] Uses home oxygen   [] Productive cough   [] Hemoptysis   [] Wheeze  [] COPD   [] Asthma Neurologic:  [] Dizziness  [] Blackouts   [] Seizures   [] History of stroke   [] History of TIA  [] Aphasia   [] Temporary blindness   [] Dysphagia   [] Weakness or numbness in arms   [] Weakness or numbness in legs Musculoskeletal:  [x] Arthritis   [x] Joint swelling   [x] Joint pain   [] Low back pain Hematologic:  [] Easy bruising  [] Easy bleeding   [] Hypercoagulable state   [] Anemic   Gastrointestinal:  [] Blood in stool   [] Vomiting blood  [x] Gastroesophageal reflux/heartburn   [] Abdominal  pain Genitourinary:  [] Chronic kidney disease   [] Difficult urination  [] Frequent urination  [] Burning with urination   [] Hematuria Skin:  [] Rashes   [] Ulcers   [] Wounds Psychological:  [] History of anxiety   []  History of major depression.  Physical Examination  BP (!) 145/68 (BP Location: Left Arm, Patient Position: Sitting, Cuff Size: Normal)   Pulse (!) 52  Resp 16   Ht 5' 4 (1.626 m)   Wt 163 lb 12.8 oz (74.3 kg)   BMI 28.12 kg/m  Gen:  WD/WN, NAD Head: [place]/AT, No temporalis wasting. Ear/Nose/Throat: Hearing grossly intact, nares w/o erythema or drainage Eyes: Conjunctiva clear. Sclera non-icteric Neck: Supple.  Trachea midline Pulmonary:  Good air movement, no use of accessory muscles.  Cardiac: bradycardic Vascular:  Vessel Right Left  Radial Palpable Palpable                          PT Not Palpable 1+ Palpable  DP Not Palpable 2+ Palpable   Gastrointestinal: soft, non-tender/non-distended. No guarding/reflex.  Musculoskeletal: M/S 5/5 throughout.  No deformity or atrophy. Right foot is cool and has sluggish capillary refill. 1+ RLE edema. Neurologic: Sensation grossly intact in extremities.  Symmetrical.  Speech is fluent.  Psychiatric: Judgment intact, Mood & affect appropriate for pt's clinical situation. Dermatologic: No rashes or ulcers noted.  No cellulitis or open wounds.      Labs Recent Results (from the past 2160 hours)  VAS US  ABI WITH/WO TBI     Status: None   Collection Time: 11/25/23 11:01 AM  Result Value Ref Range   Right ABI 0.35    Left ABI 0.81     Radiology VAS US  ABI WITH/WO TBI Result Date: 11/26/2023  LOWER EXTREMITY DOPPLER STUDY Patient Name:  Lynn Wood  Date of Exam:   11/25/2023 Medical Rec #: [id number]              Accession #:    [id number] Date of Birth: 09-10-1945              Patient Gender: F Patient Age:   18 years Exam Location:  [facility] Procedure:      VAS US  ABI WITH/WO TBI Referring Phys:  Sharla Davis --------------------------------------------------------------------------------  Indications: Claudication, and peripheral artery disease. High Risk Factors: Hypertension, hyperlipidemia, current smoker. Other Factors: RIght leg edema and pain.  Vascular Interventions: 05/19/2018: Angiogram and Selective Angiogram, PTA's of                         Lt ATA and Distal Popliteal Artery. PTA's of the Left                         SFA and Popliteal Artery. Performing Technologist: Oneta Bilberry RVT  Examination Guidelines: A complete evaluation includes at minimum, Doppler waveform signals and systolic blood pressure reading at the level of bilateral brachial, anterior tibial, and posterior tibial arteries, when vessel segments are accessible. Bilateral testing is considered an integral part of a complete examination. Photoelectric Plethysmograph (PPG) waveforms and toe systolic pressure readings are included as required and additional duplex testing as needed. Limited examinations for reoccurring indications may be performed as noted.  ABI Findings: +---------+------------------+-----+----------+--------+ Right    Rt Pressure (mmHg)IndexWaveform  Comment  +---------+------------------+-----+----------+--------+ Brachial 147                                       +---------+------------------+-----+----------+--------+ PTA      51                0.35 monophasic         +---------+------------------+-----+----------+--------+ DP       50  0.34 monophasic         +---------+------------------+-----+----------+--------+ Great Toe0                 0.00                    +---------+------------------+-----+----------+--------+ +---------+------------------+-----+----------+-------+ Left     Lt Pressure (mmHg)IndexWaveform  Comment +---------+------------------+-----+----------+-------+ Brachial 143                                       +---------+------------------+-----+----------+-------+ PTA      119               0.81 monophasic        +---------+------------------+-----+----------+-------+ DP       106               0.72 biphasic          +---------+------------------+-----+----------+-------+ Great Toe67                0.46                   +---------+------------------+-----+----------+-------+ +-------+-----------+-----------+------------+------------+ ABI/TBIToday's ABIToday's TBIPrevious ABIPrevious TBI +-------+-----------+-----------+------------+------------+ Right  0.35       0.00       0.81        0.52         +-------+-----------+-----------+------------+------------+ Left   0.81       0.46       0.87        0.60         +-------+-----------+-----------+------------+------------+  Right ABIs appear decreased compared to prior study on 08/28/2022. Left ABIs appear essentially unchanged compared to prior study on 08/28/2022.  Summary: Right: Resting right ankle-brachial index indicates severe right lower extremity arterial disease. The right toe-brachial index is abnormal. Limited imaging showed >50% Popliteal artery stenosis. Left: Resting left ankle-brachial index indicates mild left lower extremity arterial disease. The left toe-brachial index is abnormal. *See table(s) above for measurements and observations.  Electronically signed by Mikki Alexander MD on 11/26/2023 at 7:13:10 AM.    Final     Assessment/Plan  Other hyperlipidemia lipid control important in reducing the progression of atherosclerotic disease. Statin intolerant   Atherosclerosis of native arteries of extremity with rest pain [facility]) The patient has critical and limb threatening ischemia with severe rest pain.  Her ABIs had a marked drop from nearly normal down to 0.35 on her study a few days ago.  Her symptoms have now been going on for 2 or 3 months.  She has pain nearly constantly and has to dangle her foot at all times.   We discussed she needs to have an angiogram with possible revascularization.  We discussed this is clearly a critical and limb threatening situation.  I discussed the risks and benefits of the procedure.  She and her husband voiced their understanding and desire to proceed as soon as possible.  Benign essential hypertension blood pressure control important in reducing the progression of atherosclerotic disease. On appropriate oral medications.     Diabetes mellitus type 2, uncomplicated (HCC) blood glucose control important in reducing the progression of atherosclerotic disease. Also, involved in wound healing. On appropriate medications.  Mikki Alexander, MD  11/30/2023 1:58 PM    This note was created with Dragon medical transcription system.  Any errors from dictation are purely unintentional

## 2023-12-01 ENCOUNTER — Telehealth (INDEPENDENT_AMBULATORY_CARE_PROVIDER_SITE_OTHER): Payer: Self-pay

## 2023-12-01 NOTE — Telephone Encounter (Signed)
 Spoke with the patient and she is scheduled with Dr. Vonna Guardian on 12/02/23 with a 12:00 pm arrival time to the [facility]. Pre-procedure instructions were discussed and patient stated she wrote them down.

## 2023-12-02 ENCOUNTER — Ambulatory Visit
Admission: RE | Admit: 2023-12-02 | Discharge: 2023-12-02 | Disposition: A | Discharge status: Home / Self Care | Source: Ambulatory Visit | Attending: Vascular Surgery | Admitting: Vascular Surgery

## 2023-12-02 ENCOUNTER — Other Ambulatory Visit: Payer: Self-pay

## 2023-12-02 ENCOUNTER — Encounter: Payer: Self-pay | Admitting: Vascular Surgery

## 2023-12-02 ENCOUNTER — Encounter
Admission: RE | Disposition: A | Discharge status: Home / Self Care | Payer: Self-pay | Source: Ambulatory Visit | Attending: Vascular Surgery

## 2023-12-02 DIAGNOSIS — F17210 Nicotine dependence, cigarettes, uncomplicated: Secondary | ICD-10-CM | POA: Insufficient documentation

## 2023-12-02 DIAGNOSIS — Z79899 Other long term (current) drug therapy: Secondary | ICD-10-CM | POA: Insufficient documentation

## 2023-12-02 DIAGNOSIS — E1151 Type 2 diabetes mellitus with diabetic peripheral angiopathy without gangrene: Secondary | ICD-10-CM | POA: Diagnosis present

## 2023-12-02 DIAGNOSIS — Z8673 Personal history of transient ischemic attack (TIA), and cerebral infarction without residual deficits: Secondary | ICD-10-CM | POA: Diagnosis not present

## 2023-12-02 DIAGNOSIS — I70229 Atherosclerosis of native arteries of extremities with rest pain, unspecified extremity: Secondary | ICD-10-CM

## 2023-12-02 DIAGNOSIS — Z7982 Long term (current) use of aspirin: Secondary | ICD-10-CM | POA: Insufficient documentation

## 2023-12-02 DIAGNOSIS — I10 Essential (primary) hypertension: Secondary | ICD-10-CM | POA: Diagnosis not present

## 2023-12-02 DIAGNOSIS — E7849 Other hyperlipidemia: Secondary | ICD-10-CM | POA: Diagnosis not present

## 2023-12-02 DIAGNOSIS — I70221 Atherosclerosis of native arteries of extremities with rest pain, right leg: Secondary | ICD-10-CM

## 2023-12-02 LAB — BUN: BUN: 24 mg/dL — ABNORMAL HIGH (ref 8–23)

## 2023-12-02 LAB — CREATININE, SERUM
Creatinine, Ser: 0.82 mg/dL (ref 0.44–1.00)
GFR, Estimated: >60 mL/min (ref >60)

## 2023-12-02 SURGERY — LOWER EXTREMITY ANGIOGRAPHY
Anesthesia: Moderate Sedation | Site: Leg Lower | Laterality: Right

## 2023-12-02 MED ORDER — HEPARIN SODIUM (PORCINE) 1000 UNIT/ML IJ SOLN
INTRAMUSCULAR | Status: AC
  Filled 2023-12-02: qty 10

## 2023-12-02 MED ORDER — FENTANYL CITRATE PF 50 MCG/ML IJ SOSY
PREFILLED_SYRINGE | INTRAMUSCULAR | Status: AC
  Filled 2023-12-02: qty 1

## 2023-12-02 MED ORDER — CEFAZOLIN SODIUM-DEXTROSE 2-4 GM/100ML-% IV SOLN
2.0000 g | INTRAVENOUS | Status: AC
  Administered 2023-12-02: 2 g via INTRAVENOUS

## 2023-12-02 MED ORDER — NITROGLYCERIN 1 MG/10 ML FOR IR/CATH LAB
INTRA_ARTERIAL | Status: AC
Start: 2023-12-02 — End: 2023-12-02
  Filled 2023-12-02: qty 10

## 2023-12-02 MED ORDER — FAMOTIDINE 20 MG PO TABS: 40.0000 mg | ORAL_TABLET | Freq: Once | ORAL | Status: DC | PRN

## 2023-12-02 MED ORDER — HYDRALAZINE HCL 20 MG/ML IJ SOLN: 5.0000 mg | INTRAMUSCULAR | Status: DC | PRN

## 2023-12-02 MED ORDER — HYDROMORPHONE HCL 1 MG/ML IJ SOLN
INTRAMUSCULAR | Status: AC
  Filled 2023-12-02: qty 0.5

## 2023-12-02 MED ORDER — HYDROMORPHONE HCL 1 MG/ML IJ SOLN
1.0000 mg | Freq: Once | INTRAMUSCULAR | Status: AC | PRN
  Administered 2023-12-02 (×2): 0.5 mg via INTRAVENOUS

## 2023-12-02 MED ORDER — LABETALOL HCL 5 MG/ML IV SOLN: 10.0000 mg | INTRAVENOUS | Status: DC | PRN

## 2023-12-02 MED ORDER — SODIUM CHLORIDE 0.9 % IV SOLN: INTRAVENOUS | Status: DC

## 2023-12-02 MED ORDER — SODIUM CHLORIDE 0.9 % IV SOLN: 250.0000 mL | INTRAVENOUS | Status: DC | PRN

## 2023-12-02 MED ORDER — DIPHENHYDRAMINE HCL 50 MG/ML IJ SOLN: 50.0000 mg | Freq: Once | INTRAMUSCULAR | Status: DC | PRN

## 2023-12-02 MED ORDER — LABETALOL HCL 5 MG/ML IV SOLN
INTRAVENOUS | Status: DC | PRN
  Administered 2023-12-02: 10 mg via INTRAVENOUS

## 2023-12-02 MED ORDER — SODIUM CHLORIDE 0.9% FLUSH: 3.0000 mL | INTRAVENOUS | Status: DC | PRN

## 2023-12-02 MED ORDER — LIDOCAINE-EPINEPHRINE (PF) 1 %-1:200000 IJ SOLN
INTRAMUSCULAR | Status: DC | PRN
  Administered 2023-12-02: 10 mL via INTRADERMAL

## 2023-12-02 MED ORDER — MIDAZOLAM HCL 2 MG/2ML IJ SOLN
INTRAMUSCULAR | Status: DC | PRN
  Administered 2023-12-02 (×4): 1 mg via INTRAVENOUS
  Administered 2023-12-02: 2 mg via INTRAVENOUS

## 2023-12-02 MED ORDER — NITROGLYCERIN 1 MG/10 ML FOR IR/CATH LAB
INTRA_ARTERIAL | Status: DC | PRN
  Administered 2023-12-02: 400 ug via INTRA_ARTERIAL
  Administered 2023-12-02: 300 ug via INTRA_ARTERIAL

## 2023-12-02 MED ORDER — IODIXANOL 320 MG/ML IV SOLN
INTRAVENOUS | Status: DC | PRN
  Administered 2023-12-02: 65 mL via INTRA_ARTERIAL

## 2023-12-02 MED ORDER — CLOPIDOGREL BISULFATE 75 MG PO TABS: 75.0000 mg | ORAL_TABLET | Freq: Every day | ORAL | 11 refills | Status: DC

## 2023-12-02 MED ORDER — METHYLPREDNISOLONE SODIUM SUCC 125 MG IJ SOLR: 125.0000 mg | Freq: Once | INTRAMUSCULAR | Status: DC | PRN

## 2023-12-02 MED ORDER — MIDAZOLAM HCL 2 MG/2ML IJ SOLN
INTRAMUSCULAR | Status: AC
  Filled 2023-12-02: qty 4

## 2023-12-02 MED ORDER — LABETALOL HCL 5 MG/ML IV SOLN
INTRAVENOUS | Status: AC
  Filled 2023-12-02: qty 4

## 2023-12-02 MED ORDER — HEPARIN (PORCINE) IN NACL 2000-0.9 UNIT/L-% IV SOLN
INTRAVENOUS | Status: DC | PRN
  Administered 2023-12-02: 1000 mL

## 2023-12-02 MED ORDER — HYDROMORPHONE HCL 1 MG/ML IJ SOLN: 1.0000 mg | INTRAMUSCULAR | Status: DC | PRN

## 2023-12-02 MED ORDER — MIDAZOLAM HCL 2 MG/2ML IJ SOLN
INTRAMUSCULAR | Status: AC
  Filled 2023-12-02: qty 2

## 2023-12-02 MED ORDER — FENTANYL CITRATE (PF) 100 MCG/2ML IJ SOLN
INTRAMUSCULAR | Status: DC | PRN
  Administered 2023-12-02: 25 ug via INTRAVENOUS
  Administered 2023-12-02 (×3): 50 ug via INTRAVENOUS
  Administered 2023-12-02: 25 ug via INTRAVENOUS

## 2023-12-02 MED ORDER — FENTANYL CITRATE (PF) 100 MCG/2ML IJ SOLN
INTRAMUSCULAR | Status: AC
Start: 2023-12-02 — End: 2023-12-02
  Filled 2023-12-02: qty 2

## 2023-12-02 MED ORDER — MIDAZOLAM HCL 2 MG/ML PO SYRP: 8.0000 mg | ORAL_SOLUTION | Freq: Once | ORAL | Status: DC | PRN

## 2023-12-02 MED ORDER — SODIUM CHLORIDE 0.9% FLUSH: 3.0000 mL | Freq: Two times a day (BID) | INTRAVENOUS | Status: DC

## 2023-12-02 MED ORDER — CLOPIDOGREL BISULFATE 75 MG PO TABS
75.0000 mg | ORAL_TABLET | Freq: Every day | ORAL | Status: DC
Start: 2023-12-03 — End: 2023-12-02

## 2023-12-02 MED ORDER — HEPARIN SODIUM (PORCINE) 1000 UNIT/ML IJ SOLN
INTRAMUSCULAR | Status: DC | PRN
  Administered 2023-12-02: 5000 [IU] via INTRAVENOUS

## 2023-12-02 MED ORDER — ONDANSETRON HCL 4 MG/2ML IJ SOLN: 4.0000 mg | Freq: Four times a day (QID) | INTRAMUSCULAR | Status: DC | PRN

## 2023-12-02 MED ORDER — CEFAZOLIN SODIUM-DEXTROSE 2-4 GM/100ML-% IV SOLN
INTRAVENOUS | Status: AC
  Filled 2023-12-02: qty 100

## 2023-12-02 MED ORDER — ACETAMINOPHEN 325 MG PO TABS: 650.0000 mg | ORAL_TABLET | ORAL | Status: DC | PRN

## 2023-12-02 SURGICAL SUPPLY — 21 items
BALLOON JADE .014 3.0 X 20 (BALLOONS) IMPLANT
BALLOON LUTONIX 018 4X100X130 (BALLOONS) IMPLANT
BALLOON LUTONIX 018 4X220X130 (BALLOONS) IMPLANT
BALLOON LUTONIX 018 4X300X130 (BALLOONS) IMPLANT
BALLOON ULTRVS 018 2.5X150X150 (BALLOONS) IMPLANT
CATH ANGIO 5F PIGTAIL 65CM (CATHETERS) IMPLANT
CATH BEACON 5 .038 100 VERT TP (CATHETERS) IMPLANT
CATH ROTAREX 135 6FR (CATHETERS) IMPLANT
COVER PROBE ULTRASOUND 5X96 (MISCELLANEOUS) IMPLANT
DEVICE PRESTO INFLATION (MISCELLANEOUS) IMPLANT
DEVICE STARCLOSE SE CLOSURE (Vascular Products) IMPLANT
GLIDEWIRE ADV .035X260CM (WIRE) IMPLANT
GUIDEWIRE PFTE-COATED .018X300 (WIRE) IMPLANT
PACK ANGIOGRAPHY (CUSTOM PROCEDURE TRAY) ×1 IMPLANT
SHEATH ANL2 6FRX45 HC (SHEATH) IMPLANT
SHEATH BRITE TIP 5FRX11 (SHEATH) IMPLANT
STENT ESPRIT BTK 3.0X38 SCAFF (Permanent Stent) IMPLANT
STENT VIABAHN 5X100X120 (Permanent Stent) IMPLANT
TUBING CONTRAST HIGH PRESS 72 (TUBING) IMPLANT
WIRE COMMAND ST ANG 014 300 (WIRE) IMPLANT
WIRE J 3MM .035X145CM (WIRE) IMPLANT

## 2023-12-02 NOTE — Op Note (Signed)
 [facility]  Percutaneous Study/Intervention Procedural Note   Date of Surgery: 12/02/2023  Surgeon(s):Kenley Rettinger    Assistants:none  Pre-operative Diagnosis: PAD with rest pain RLE  Post-operative diagnosis:  Same  Procedure(s) Performed:             1.  Ultrasound guidance for vascular access left femoral artery             2.  Catheter placement into right common femoral from left femoral approach             3.  Aortogram and selective right lower extremity angiogram             4.  Atherectomy of right SFA and popliteal arteries with the R[place] Rex device             5.  Angioplasty of the right posterior tibial artery and tibioperoneal trunk with 2.5 mm diameter angioplasty balloon  6.  Angioplasty of the right SFA and popliteal arteries with 4 mm diameter Lutonix drug-coated angioplasty balloon  7.  Stent placement of the right tibioperoneal trunk and posterior tibial artery with 3 mm diameter by 38 mm length Esprit scaffolding system  8.  Stent placement to the right popliteal artery with a 5 mm diameter by 10 cm length Viabahn stent             9.  StarClose closure device left femoral artery  EBL: 50 cc  Contrast: 65 cc  Fluoro Time: 14.2 minutes  Moderate Conscious Sedation Time: approximately 90 minutes using 6 mg of Versed  and 200 mcg of Fentanyl               Indications:  Patient is a 78 y.o.female with profound ischemic rest pain of the right lower extremity. The patient has noninvasive study showing a markedly reduced ABI of 0.3. The patient is brought in for angiography for further evaluation and potential treatment.  Due to the limb threatening nature of the situation, angiogram was performed for attempted limb salvage. The patient is aware that if the procedure fails, amputation would be expected.  The patient also understands that even with successful revascularization, amputation may still be required due to the severity of the situation.   Risks and benefits are discussed and informed consent is obtained.   Procedure:  The patient was identified and appropriate procedural time out was performed.  The patient was then placed supine on the table and prepped and draped in the usual sterile fashion. Moderate conscious sedation was administered during a face to face encounter with the patient throughout the procedure with my supervision of the RN administering medicines and monitoring the patient's vital signs, pulse oximetry, telemetry and mental status throughout from the start of the procedure until the patient was taken to the recovery room. Ultrasound was used to evaluate the left common femoral artery.  It was patent .  A digital ultrasound image was acquired.  A Seldinger needle was used to access the left common femoral artery under direct ultrasound guidance and a permanent image was performed.  A 0.035 J wire was advanced without resistance and a 5Fr sheath was placed.  Pigtail catheter was placed into the aorta and an AP aortogram was performed. This demonstrated normal renal arteries and normal aorta and iliac segments without significant stenosis. I then crossed the aortic bifurcation and advanced to the right femoral head. Selective right lower extremity angiogram was then performed. This demonstrated fairly diffuse disease of the mid  and distal SFA above the below-knee popliteal artery with a string sign stenosis in the above-knee popliteal artery of greater than 90% in multiple areas of greater than 60% stenosis.  The tibioperoneal trunk and proximal posterior tibial artery also have a greater than 90% stenosis and then the posterior tibial artery was the largest runoff vessel into the foot and the best runoff distally.  Both the anterior tibial and peroneal arteries have areas of occlusion with poor flow distally. It was felt that it was in the patient's best interest to proceed with intervention after these images to avoid a second  procedure and a larger amount of contrast and fluoroscopy based off of the findings from the initial angiogram. The patient was systemically heparinized and a 6 [place] Ansell sheath was then placed over the [employer] wire. I then used a Kumpe catheter and the advantage wire to get down into the popliteal artery then exchanged for a 0.018 advantage wire was able to cross the multiple areas of stenosis and get down into the distal posterior tibial artery.  Using a catheter exchange, then placed a 0.014 command wire.  Atherectomy was then performed and popliteal arteries with about 4 passes made to treat the disease at this level.  Slight improvement in luminal size was seen, but there still remained multiple areas significant stenosis.  A 2.5 mm diameter by 15 cm length angioplasty balloon was inflated in the proximal posterior tibial artery and tibioperoneal trunk and taken up to 10 atm for 1 minute.  A 4 mm diameter by 30 cm length and a 4 mm diameter by 22 cm length Lutonix drug-coated angioplasty balloon were used to treat from the below-knee popliteal artery up to the proximal to mid superficial femoral artery with inflation of about 10 to 12 atm for 1 minute.  Completion imaging showed focal residual stenosis in the tibioperoneal trunk and at the origin of the posterior tibial artery of greater than 50% and in the above-knee popliteal artery of greater than 50%.  The other areas treated were less than 30% after angioplasty.  A 5 mm diameter by 10 cm length Viabahn stent was placed in the right popliteal artery and postdilated with a 4 mm balloon with less than 10% residual stenosis.  For the residual tibial disease, a 3 mm diameter by 38 mm length Esprit scaffolding system was selected and deployed in the right tibioperoneal trunk and proximal posterior tibial artery and then postdilated with a 3 mm balloon with excellent angiographic completion result and less than 10% residual stenosis in the right  tibioperoneal trunk and proximal posterior tibial artery. I elected to terminate the procedure. The sheath was removed and StarClose closure device was deployed in the left femoral artery with excellent hemostatic result. The patient was taken to the recovery room in stable condition having tolerated the procedure well.  Findings:               Aortogram:  This demonstrated normal renal arteries and normal aorta and iliac segments without significant stenosis.             Right Lower Extremity:  This demonstrated fairly diffuse disease of the mid and distal SFA above the below-knee popliteal artery with a string sign stenosis in the above-knee popliteal artery of greater than 90% in multiple areas of greater than 60% stenosis.  The tibioperoneal trunk and proximal posterior tibial artery also have a greater than 90% stenosis and then the posterior tibial artery was the  largest runoff vessel into the foot and the best runoff distally.  Both the anterior tibial and peroneal arteries have areas of occlusion with poor flow distally.   Disposition: Patient was taken to the recovery room in stable condition having tolerated the procedure well.  Complications: None  Mikki Alexander 12/02/2023 4:18 PM   This note was created with Dragon Medical transcription system. Any errors in dictation are purely unintentional.

## 2023-12-02 NOTE — Interval H&P Note (Signed)
 History and Physical Interval Note:  12/02/2023 12:08 PM  Lynn Wood  has presented today for surgery, with the diagnosis of RLE Angio   ASO w rest pain.  The various methods of treatment have been discussed with the patient and family. After consideration of risks, benefits and other options for treatment, the patient has consented to  Procedure(s): Lower Extremity Angiography (Right) PERIPHERAL VASCULAR THROMBECTOMY (Right) as a surgical intervention.  The patient's history has been reviewed, patient examined, no change in status, stable for surgery.  I have reviewed the patient's chart and labs.  Questions were answered to the patient's satisfaction.     [name]

## 2023-12-03 ENCOUNTER — Telehealth (INDEPENDENT_AMBULATORY_CARE_PROVIDER_SITE_OTHER): Payer: Self-pay | Admitting: Vascular Surgery

## 2023-12-03 ENCOUNTER — Encounter: Payer: Self-pay | Admitting: Vascular Surgery

## 2023-12-03 NOTE — Telephone Encounter (Signed)
 Tried to call pt x4 to make post op appt. Main phone number left from H&V [phone] no answer and voicemail box is full unable to leave message. Tried to call alternate phone number no answer and no voicemail to leave message.  fu 4 weeks w/ABI see FB/BP

## 2023-12-14 ENCOUNTER — Encounter (INDEPENDENT_AMBULATORY_CARE_PROVIDER_SITE_OTHER)

## 2023-12-14 ENCOUNTER — Ambulatory Visit (INDEPENDENT_AMBULATORY_CARE_PROVIDER_SITE_OTHER): Admitting: Vascular Surgery

## 2023-12-23 NOTE — Home Health (Signed)
 Discharge from Agency: Patient has shown significant improvement throughout course of activity working on walking longer distances in her home using a SPC PRN. She has consistently been able to ambulate outdoors and navigate steps using a cane with improved stability as well. She had a vascular surgery to her RLE and reports significant improvement in pain after stent placements and she is able to stay on her feet longer w/o weakness/pain. She reports being able to prepare meals and participate more in her ADL's. She has a supportive spouse who works with her on her walking and exs program as well. She would benefit from further therapy services in an outpatient setting to progress working on dynamic balance and strengthening. Patient has shown conitnued imrpovement in [place] up to 20/28 from 19/28 at last reassessment and TUG reduced from 88 seconds to 38 seconds indicating a reduced risk of fall. She also was able to perform an additional rep with sit<>stand test from 7 reps to 8 reps in 30 seconds w/o using her arms today. Patient is in understanding to continue her HEP and she is in agreement with DC at this time.

## 2024-01-04 ENCOUNTER — Other Ambulatory Visit (INDEPENDENT_AMBULATORY_CARE_PROVIDER_SITE_OTHER): Payer: Self-pay | Admitting: Vascular Surgery

## 2024-01-04 DIAGNOSIS — Z9889 Other specified postprocedural states: Secondary | ICD-10-CM

## 2024-01-06 ENCOUNTER — Ambulatory Visit (INDEPENDENT_AMBULATORY_CARE_PROVIDER_SITE_OTHER)

## 2024-01-06 DIAGNOSIS — I739 Peripheral vascular disease, unspecified: Secondary | ICD-10-CM | POA: Diagnosis not present

## 2024-01-06 DIAGNOSIS — Z9889 Other specified postprocedural states: Secondary | ICD-10-CM

## 2024-01-07 ENCOUNTER — Encounter (INDEPENDENT_AMBULATORY_CARE_PROVIDER_SITE_OTHER): Payer: Self-pay | Admitting: Vascular Surgery

## 2024-01-07 ENCOUNTER — Ambulatory Visit (INDEPENDENT_AMBULATORY_CARE_PROVIDER_SITE_OTHER): Admitting: Vascular Surgery

## 2024-01-07 VITALS — BP 149/89 | HR 77 | Resp 17 | Ht 64.0 in | Wt 158.0 lb

## 2024-01-07 DIAGNOSIS — E119 Type 2 diabetes mellitus without complications: Secondary | ICD-10-CM | POA: Diagnosis not present

## 2024-01-07 DIAGNOSIS — I10 Essential (primary) hypertension: Secondary | ICD-10-CM

## 2024-01-07 DIAGNOSIS — I70222 Atherosclerosis of native arteries of extremities with rest pain, left leg: Secondary | ICD-10-CM

## 2024-01-07 DIAGNOSIS — Z9889 Other specified postprocedural states: Secondary | ICD-10-CM | POA: Diagnosis not present

## 2024-01-07 NOTE — H&P (View-Only) (Signed)
 Subjective:    Patient ID: Lynn Wood, female    DOB: 08/10/1945, 78 y.o.   MRN: [id number] Chief Complaint  Patient presents with   Follow-up    Lynn Wood is a 78 yo female who follows up in clinic today post right lower extremity angiogram with stent placement on 12/02/23  Procedure(s) Performed:             1.  Ultrasound guidance for vascular access left femoral artery             2.  Catheter placement into right common femoral from left femoral approach             3.  Aortogram and selective right lower extremity angiogram             4.  Atherectomy of right SFA and popliteal arteries with the Rota Rex device             5.  Angioplasty of the right posterior tibial artery and tibioperoneal trunk with 2.5 mm diameter angioplasty balloon             6.  Angioplasty of the right SFA and popliteal arteries with 4 mm diameter Lutonix drug-coated angioplasty balloon             7.  Stent placement of the right tibioperoneal trunk and posterior tibial artery with 3 mm diameter by 38 mm length Esprit scaffolding system             8.  Stent placement to the right popliteal artery with a 5 mm diameter by 10 cm length Viabahn stent             9.  StarClose closure device left femoral artery   Patient endorses her right leg feels much better today. She has had some intermittent swelling that is not present today. Her ABI on the right post procedure today is .90 with triphasic wave forms.   However she now complains of pain to her left lower extremity at rest and worse with ambulation. Patients Left lower extremity ABI today is .56 down from .81 She states the pain is constant with a throbbing and tightness to her thigh and calf. This is unrelieved by the use of motrin .     Review of Systems  Constitutional: Negative.   Cardiovascular:  Positive for leg swelling.       Intermittent to right leg post procedure.   Musculoskeletal:        Positive left lower extremity pain  at rest and with ambulation.   All other systems reviewed and are negative.      Objective:   Physical Exam Vitals reviewed.  Constitutional:      Appearance: Normal appearance. She is normal weight.  HENT:     Head: Normocephalic.  Eyes:     Pupils: Pupils are equal, round, and reactive to light.  Cardiovascular:     Rate and Rhythm: Normal rate and regular rhythm.     Pulses: Normal pulses.     Heart sounds: Normal heart sounds.  Pulmonary:     Effort: Pulmonary effort is normal.     Breath sounds: Normal breath sounds.  Abdominal:     General: Abdomen is flat. Bowel sounds are normal.     Palpations: Abdomen is soft.  Musculoskeletal:        General: Tenderness present.  Skin:    General: Skin is warm and dry.     Capillary  Refill: Capillary refill takes 2 to 3 seconds.  Neurological:     General: No focal deficit present.     Mental Status: She is alert and oriented to person, place, and time. Mental status is at baseline.  Psychiatric:        Mood and Affect: Mood normal.        Behavior: Behavior normal.        Thought Content: Thought content normal.        Judgment: Judgment normal.     BP (!) 149/89 (BP Location: Left Arm, Cuff Size: Normal)   Pulse 77   Resp 17   Ht 5' 4 (1.626 m)   Wt 158 lb (71.7 kg)   BMI 27.12 kg/m   Past Medical History:  Diagnosis Date   Arthritis    Benign neoplasm of colon    Breast cancer (HCC) 08/2016   left   Cancer (HCC) 06/2016   left breast   Carpal tunnel syndrome    DCIS (ductal carcinoma in situ) of breast    Diabetes mellitus without complication (HCC)    Hypertension    Nontoxic uninodular goiter    Pre-diabetes    Pure hypercholesterolemia    Vaginal vault prolapse    Vitamin D deficiency     Social History   Socioeconomic History   Marital status: Married    Spouse name: Not on file   Number of children: 2   Years of education: 14   Highest education level: Not on file  Occupational History    Occupation: retired  Tobacco Use   Smoking status: Light Smoker    Current packs/day: 0.25    Types: Cigarettes   Smokeless tobacco: Never   Tobacco comments:    1 cigarette every 1-2 weeks  Vaping Use   Vaping status: Never Used  Substance and Sexual Activity   Alcohol use: No   Drug use: No   Sexual activity: Not on file  Other Topics Concern   Not on file  Social History Narrative   Lives with husband in a one story home.  Has 2 children.  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  Education: college.    Social Drivers of Health   Financial Resource Strain: Medium Risk (10/18/2023)   Received from [facility]   Overall Financial Resource Strain (CARDIA)    Difficulty of Paying Living Expenses: Somewhat hard  Food Insecurity: Food Insecurity Present (10/18/2023)   Received from [facility]   Hunger Vital Sign    Within the past 12 months, you worried that your food would run out before you got the money to buy more.: Sometimes true    Within the past 12 months, the food you bought just didn't last and you didn't have money to get more.: Sometimes true  Transportation Needs: No Transportation Needs (10/18/2023)   Received from [facility] - Transportation    In the past 12 months, has lack of transportation kept you from medical appointments or from getting medications?: No    Lack of Transportation (Non-Medical): No  Physical Activity: Not on file  Stress: Not on file  Social Connections: Not on file  Intimate Partner Violence: Not on file    Past Surgical History:  Procedure Laterality Date   COLONOSCOPY WITH PROPOFOL  N/A 01/22/2015   Procedure: COLONOSCOPY WITH PROPOFOL ;  Surgeon: Gladis RAYMOND Mariner, MD;  Location:  [facility] ENDOS[facility];  Service: Endoscopy;  Laterality: N/A;   COLONOSCOPY WITH PROPOFOL  N/A 05/07/2020   Procedure: COLONOSCOPY WITH PROPOFOL ;  Surgeon:  Maryruth Ole DASEN, MD;  Location: [facility] ENDOSCOPY;  Service: Endoscopy;  Laterality: N/A;   GYNECOLOGIC CRYOSURGERY     INCISION AND DRAINAGE ABSCESS Left 07/29/2016   Procedure: Reexploration of left breast mastectomy site;  Surgeon: Laneta JULIANNA Luna, MD;  Location: [facility] ORS;  Service: General;  Laterality: Left;   LOWER EXTREMITY ANGIOGRAPHY Left 05/19/2018   Procedure: LOWER EXTREMITY ANGIOGRAPHY;  Surgeon: Marea Selinda RAMAN, MD;  Location: [facility];  Service: Cardiovascular;  Laterality: Left;   LOWER EXTREMITY ANGIOGRAPHY Right 12/02/2023   Procedure: Lower Extremity Angiography;  Surgeon: Marea Selinda RAMAN, MD;  Location: [facility];  Service: Cardiovascular;  Laterality: Right;   MASTECTOMY Left    March 2018   MASTECTOMY W/ SENTINEL NODE BIOPSY Left 07/28/2016   Procedure: MASTECTOMY WITH SENTINEL LYMPH NODE BIOPSY;  Surgeon: Laneta JULIANNA Luna, MD;  Location: [facility] ORS;  Service: General;  Laterality: Left;   MULTIPLE TOOTH EXTRACTIONS  1997   PERIPHERAL VASCULAR THROMBECTOMY Right 12/02/2023   Procedure: PERIPHERAL VASCULAR THROMBECTOMY;  Surgeon: Marea Selinda RAMAN, MD;  Location: [facility];  Service: Cardiovascular;  Laterality: Right;   TOTAL KNEE ARTHROPLASTY Right 02/10/2022   Procedure: TOTAL KNEE ARTHROPLASTY;  Surgeon: Leora Lynwood SAUNDERS, MD;  Location: [facility] ORS;  Service: Orthopedics;  Laterality: Right;   TUBAL LIGATION      Family History  Problem Relation Age of Onset   Breast cancer Cousin        mat cousin   Colon cancer Mother    Prostate cancer Father    Prostate cancer Brother    Heart disease Daughter     Allergies  Allergen Reactions   Lipitor [Atorvastatin] Other (See Comments)    Arthalgia   Lipofen [Fenofibrate] Other (See Comments)    Arthalgia   Mevacor [Lovastatin] Other (See Comments)    Arthalgia   Zetia [Ezetimibe] Other (See Comments)    Arthalgia   Zocor [Simvastatin] Other (See Comments)    Arthalgia       Latest Ref Rng & Units  02/02/2022   10:12 AM 09/13/2017   11:34 AM 03/15/2017   10:29 AM  CBC  WBC 4.0 - 10.5 K/uL 5.8  5.5  5.4   Hemoglobin 12.0 - 15.0 g/dL 87.6  87.0  87.8   Hematocrit 36.0 - 46.0 % 37.9  38.0  35.5   Platelets 150 - 400 K/uL 362  273  254       CMP     Component Value Date/Time   NA 140 02/02/2022 1012   K 3.8 02/02/2022 1012   CL 111 02/02/2022 1012   CO2 24 02/02/2022 1012   GLUCOSE 93 02/02/2022 1012   BUN 24 (H) 12/02/2023 1227   CREATININE 0.82 12/02/2023 1227   CALCIUM 9.2 02/02/2022 1012   PROT 7.3 09/13/2017 1134   ALBUMIN 4.0 09/13/2017 1134   AST 19 09/13/2017 1134   ALT 11 (L) 09/13/2017 1134   ALKPHOS 79 09/13/2017 1134   BILITOT 0.4 09/13/2017 1134   GFRNONAA >60 12/02/2023 1227     VAS US  ABI WITH/WO TBI Result Date: 11/26/2023  LOWER EXTREMITY DOPPLER STUDY Patient Name:  Cece Milhouse  Date of Exam:   11/25/2023 Medical Rec #: [id number]              Accession #:  [id number] Date of Birth: August 17, 1945              Patient Gender: F Patient Age:   72 years Exam Location:  [facility] Procedure:      VAS US  ABI WITH/WO TBI Referring Phys: ORVIN DARING --------------------------------------------------------------------------------  Indications: Claudication, and peripheral artery disease. High Risk Factors: Hypertension, hyperlipidemia, current smoker. Other Factors: RIght leg edema and pain.  Vascular Interventions: 05/19/2018: Angiogram and Selective Angiogram, PTA's of                         Lt ATA and Distal Popliteal Artery. PTA's of the Left                         SFA and Popliteal Artery. Performing Technologist: Donnice Charnley RVT  Examination Guidelines: A complete evaluation includes at minimum, Doppler waveform signals and systolic blood pressure reading at the level of bilateral brachial, anterior tibial, and posterior tibial arteries, when vessel segments are accessible. Bilateral testing is considered an integral part of a complete  examination. Photoelectric Plethysmograph (PPG) waveforms and toe systolic pressure readings are included as required and additional duplex testing as needed. Limited examinations for reoccurring indications may be performed as noted.  ABI Findings: +---------+------------------+-----+----------+--------+ Right    Rt Pressure (mmHg)IndexWaveform  Comment  +---------+------------------+-----+----------+--------+ Brachial 147                                       +---------+------------------+-----+----------+--------+ PTA      51                0.35 monophasic         +---------+------------------+-----+----------+--------+ DP       50                0.34 monophasic         +---------+------------------+-----+----------+--------+ Great Toe0                 0.00                    +---------+------------------+-----+----------+--------+ +---------+------------------+-----+----------+-------+ Left     Lt Pressure (mmHg)IndexWaveform  Comment +---------+------------------+-----+----------+-------+ Brachial 143                                      +---------+------------------+-----+----------+-------+ PTA      119               0.81 monophasic        +---------+------------------+-----+----------+-------+ DP       106               0.72 biphasic          +---------+------------------+-----+----------+-------+ Great Toe67                0.46                   +---------+------------------+-----+----------+-------+ +-------+-----------+-----------+------------+------------+ ABI/TBIToday's ABIToday's TBIPrevious ABIPrevious TBI +-------+-----------+-----------+------------+------------+ Right  0.35       0.00       0.81        0.52         +-------+-----------+-----------+------------+------------+ Left   0.81       0.46       0.87  0.60         +-------+-----------+-----------+------------+------------+  Right ABIs appear decreased  compared to prior study on 08/28/2022. Left ABIs appear essentially unchanged compared to prior study on 08/28/2022.  Summary: Right: Resting right ankle-brachial index indicates severe right lower extremity arterial disease. The right toe-brachial index is abnormal. Limited imaging showed >50% Popliteal artery stenosis. Left: Resting left ankle-brachial index indicates mild left lower extremity arterial disease. The left toe-brachial index is abnormal. *See table(s) above for measurements and observations.  Electronically signed by Selinda Gu MD on 11/26/2023 at 7:13:10 AM.    Final        Assessment & Plan:   1. Peripheral arterial disease with history of revascularization (HCC) (Primary) Recommend:  The patient is status post successful angiogram with intervention.  The patient reports that the claudication symptoms and leg pain has improved.   The patient denies lifestyle limiting changes at this point in time.  No further invasive studies, angiography or surgery at this time. The patient should continue walking and begin a more formal exercise program. Patients Right Lower Extremity ABI today is .90 The patient should continue antiplatelet therapy and aggressive treatment of the lipid abnormalities  Continued surveillance is indicated as atherosclerosis is likely to progress with time.    Patient should undergo noninvasive studies as ordered. The patient will follow up with me to review the studies in 3 months.   2. 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 Patient ABI of the left lower extremity today is .56  Given the severity of the patient's severe left lower extremity symptoms the patient should undergo angiography with the hope for intervention.  Risk and benefits were reviewed the  patient.  Indications for the procedure were reviewed.  All questions were answered, the patient agrees to proceed with left lower extremity angiography and possible intervention.   The patient should continue walking and begin a more formal exercise program.  The patient should continue antiplatelet therapy and aggressive treatment of the lipid abnormalities  The patient will follow up with me after the angiogram.   3. Benign essential hypertension Continue antihypertensive medications as already ordered, these medications have been reviewed and there are no changes at this time.  4. Type 2 diabetes mellitus without complication, without long-term current use of insulin (HCC) Continue hypoglycemic medications as already ordered, these medications have been reviewed and there are no changes at this time.  Hgb A1C to be monitored as already arranged by primary service   Current Outpatient Medications on File Prior to Visit  Medication Sig Dispense Refill   aspirin  81 MG chewable tablet Chew 1 tablet (81 mg total) by mouth 2 (two) times daily. 60 tablet 0   atenolol  (TENORMIN ) 50 MG tablet Take 50 mg by mouth at bedtime.     Cholecalciferol (VITAMIN D) 2000 units CAPS Take 1 capsule by mouth daily.     clopidogrel  (PLAVIX ) 75 MG tablet Take 1 tablet (75 mg total) by mouth daily. 30 tablet 11   docusate sodium  (COLACE) 100 MG capsule Take 1 capsule (100 mg total) by mouth 2 (two) times daily. 30 capsule 0   fluticasone (FLONASE) 50 MCG/ACT nasal spray Place 1 spray into both nostrils daily as needed for allergies.      HYDROcodone -acetaminophen  (NORCO/VICODIN) 5-325 MG tablet Take 1 tablet by  mouth every 4 (four) hours as needed for moderate pain (pain score 4-6). 30 tablet 0   lisinopril  (PRINIVIL ,ZESTRIL ) 20 MG tablet Take 20 mg by mouth at bedtime.     methocarbamol  (ROBAXIN -750) 750 MG tablet Take 1 tablet (750 mg total) by mouth every 8 (eight) hours as needed for muscle spasms. 60 tablet  0   Omega-3 Fatty Acids (FISH OIL PO) Take 1 capsule by mouth every other day.     No current facility-administered medications on file prior to visit.    There are no Patient Instructions on file for this visit. No follow-ups on file.   Gwendlyn JONELLE Shank, NP

## 2024-01-07 NOTE — Progress Notes (Signed)
 Subjective:    Patient ID: Sanna [name] Hatchet, female    DOB: 08/10/1945, 78 y.o.   MRN: [id number] Chief Complaint  Patient presents with   Follow-up    [name] is a 78 yo female who follows up in clinic today post right lower extremity angiogram with stent placement on 12/02/23  Procedure(s) Performed:             1.  Ultrasound guidance for vascular access left femoral artery             2.  Catheter placement into right common femoral from left femoral approach             3.  Aortogram and selective right lower extremity angiogram             4.  Atherectomy of right SFA and popliteal arteries with the [place] Rex device             5.  Angioplasty of the right posterior tibial artery and tibioperoneal trunk with 2.5 mm diameter angioplasty balloon             6.  Angioplasty of the right SFA and popliteal arteries with 4 mm diameter Lutonix drug-coated angioplasty balloon             7.  Stent placement of the right tibioperoneal trunk and posterior tibial artery with 3 mm diameter by 38 mm length Esprit scaffolding system             8.  Stent placement to the right popliteal artery with a 5 mm diameter by 10 cm length Viabahn stent             9.  StarClose closure device left femoral artery   Patient endorses her right leg feels much better today. She has had some intermittent swelling that is not present today. Her ABI on the right post procedure today is .90 with triphasic wave forms.   However she now complains of pain to her left lower extremity at rest and worse with ambulation. Patients Left lower extremity ABI today is .56 down from .81 She states the pain is constant with a throbbing and tightness to her thigh and calf. This is unrelieved by the use of motrin .     Review of Systems  Constitutional: Negative.   Cardiovascular:  Positive for leg swelling.       Intermittent to right leg post procedure.   Musculoskeletal:        Positive left lower extremity pain  at rest and with ambulation.   All other systems reviewed and are negative.      Objective:   Physical Exam Vitals reviewed.  Constitutional:      Appearance: Normal appearance. She is normal weight.  HENT:     Head: Normocephalic.  Eyes:     Pupils: Pupils are equal, round, and reactive to light.  Cardiovascular:     Rate and Rhythm: Normal rate and regular rhythm.     Pulses: Normal pulses.     Heart sounds: Normal heart sounds.  Pulmonary:     Effort: Pulmonary effort is normal.     Breath sounds: Normal breath sounds.  Abdominal:     General: Abdomen is flat. Bowel sounds are normal.     Palpations: Abdomen is soft.  Musculoskeletal:        General: Tenderness present.  Skin:    General: Skin is warm and dry.     Capillary  Refill: Capillary refill takes 2 to 3 seconds.  Neurological:     General: No focal deficit present.     Mental Status: She is alert and oriented to person, place, and time. Mental status is at baseline.  Psychiatric:        Mood and Affect: Mood normal.        Behavior: Behavior normal.        Thought Content: Thought content normal.        Judgment: Judgment normal.     BP (!) 149/89 (BP Location: Left Arm, Cuff Size: Normal)   Pulse 77   Resp 17   Ht 5' 4 (1.626 m)   Wt 158 lb (71.7 kg)   BMI 27.12 kg/m   Past Medical History:  Diagnosis Date   Arthritis    Benign neoplasm of colon    Breast cancer (HCC) 08/2016   left   Cancer (HCC) 06/2016   left breast   Carpal tunnel syndrome    DCIS (ductal carcinoma in situ) of breast    Diabetes mellitus without complication (HCC)    Hypertension    Nontoxic uninodular goiter    Pre-diabetes    Pure hypercholesterolemia    Vaginal vault prolapse    Vitamin D deficiency     Social History   Socioeconomic History   Marital status: Married    Spouse name: Not on file   Number of children: 2   Years of education: 14   Highest education level: Not on file  Occupational History    Occupation: retired  Tobacco Use   Smoking status: Light Smoker    Current packs/day: 0.25    Types: Cigarettes   Smokeless tobacco: Never   Tobacco comments:    1 cigarette every 1-2 weeks  Vaping Use   Vaping status: Never Used  Substance and Sexual Activity   Alcohol use: No   Drug use: No   Sexual activity: Not on file  Other Topics Concern   Not on file  Social History Narrative   Lives with husband in a one story home.  Has 2 children.  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  Education: college.    Social Drivers of Health   Financial Resource Strain: Medium Risk (10/18/2023)   Received from [facility]   Overall Financial Resource Strain (CARDIA)    Difficulty of Paying Living Expenses: Somewhat hard  Food Insecurity: Food Insecurity Present (10/18/2023)   Received from [facility]   Hunger Vital Sign    Within the past 12 months, you worried that your food would run out before you got the money to buy more.: Sometimes true    Within the past 12 months, the food you bought just didn't last and you didn't have money to get more.: Sometimes true  Transportation Needs: No Transportation Needs (10/18/2023)   Received from [facility] Transportation    In the past 12 months, has lack of transportation kept you from medical appointments or from getting medications?: No    Lack of Transportation (Non-Medical): No  Physical Activity: Not on file  Stress: Not on file  Social Connections: Not on file  Intimate Partner Violence: Not on file    Past Surgical History:  Procedure Laterality Date   COLONOSCOPY WITH PROPOFOL  N/A 01/22/2015   Procedure: COLONOSCOPY WITH PROPOFOL ;  Surgeon: Gladis RAYMOND Mariner, MD;  Location:  [facility] ENDOS[facility];  Service: Endoscopy;  Laterality: N/A;   COLONOSCOPY WITH PROPOFOL  N/A 05/07/2020   Procedure: COLONOSCOPY WITH PROPOFOL ;  Surgeon:  Maryruth Ole DASEN, MD;  Location: [facility] ENDOSCOPY;  Service: Endoscopy;  Laterality: N/A;   GYNECOLOGIC CRYOSURGERY     INCISION AND DRAINAGE ABSCESS Left 07/29/2016   Procedure: Reexploration of left breast mastectomy site;  Surgeon: Laneta JULIANNA Luna, MD;  Location: [facility] ORS;  Service: General;  Laterality: Left;   LOWER EXTREMITY ANGIOGRAPHY Left 05/19/2018   Procedure: LOWER EXTREMITY ANGIOGRAPHY;  Surgeon: Marea Selinda RAMAN, MD;  Location: [facility];  Service: Cardiovascular;  Laterality: Left;   LOWER EXTREMITY ANGIOGRAPHY Right 12/02/2023   Procedure: Lower Extremity Angiography;  Surgeon: Marea Selinda RAMAN, MD;  Location: [facility];  Service: Cardiovascular;  Laterality: Right;   MASTECTOMY Left    March 2018   MASTECTOMY W/ SENTINEL NODE BIOPSY Left 07/28/2016   Procedure: MASTECTOMY WITH SENTINEL LYMPH NODE BIOPSY;  Surgeon: Laneta JULIANNA Luna, MD;  Location: [facility] ORS;  Service: General;  Laterality: Left;   MULTIPLE TOOTH EXTRACTIONS  1997   PERIPHERAL VASCULAR THROMBECTOMY Right 12/02/2023   Procedure: PERIPHERAL VASCULAR THROMBECTOMY;  Surgeon: Marea Selinda RAMAN, MD;  Location: [facility];  Service: Cardiovascular;  Laterality: Right;   TOTAL KNEE ARTHROPLASTY Right 02/10/2022   Procedure: TOTAL KNEE ARTHROPLASTY;  Surgeon: Leora Lynwood SAUNDERS, MD;  Location: [facility] ORS;  Service: Orthopedics;  Laterality: Right;   TUBAL LIGATION      Family History  Problem Relation Age of Onset   Breast cancer Cousin        mat cousin   Colon cancer Mother    Prostate cancer Father    Prostate cancer Brother    Heart disease Daughter     Allergies  Allergen Reactions   Lipitor [Atorvastatin] Other (See Comments)    Arthalgia   Lipofen [Fenofibrate] Other (See Comments)    Arthalgia   Mevacor [Lovastatin] Other (See Comments)    Arthalgia   Zetia [Ezetimibe] Other (See Comments)    Arthalgia   Zocor [Simvastatin] Other (See Comments)    Arthalgia       Latest Ref Rng & Units  02/02/2022   10:12 AM 09/13/2017   11:34 AM 03/15/2017   10:29 AM  CBC  WBC 4.0 - 10.5 K/uL 5.8  5.5  5.4   Hemoglobin 12.0 - 15.0 g/dL 87.6  87.0  87.8   Hematocrit 36.0 - 46.0 % 37.9  38.0  35.5   Platelets 150 - 400 K/uL 362  273  254       CMP     Component Value Date/Time   NA 140 02/02/2022 1012   K 3.8 02/02/2022 1012   CL 111 02/02/2022 1012   CO2 24 02/02/2022 1012   GLUCOSE 93 02/02/2022 1012   BUN 24 (H) 12/02/2023 1227   CREATININE 0.82 12/02/2023 1227   CALCIUM 9.2 02/02/2022 1012   PROT 7.3 09/13/2017 1134   ALBUMIN 4.0 09/13/2017 1134   AST 19 09/13/2017 1134   ALT 11 (L) 09/13/2017 1134   ALKPHOS 79 09/13/2017 1134   BILITOT 0.4 09/13/2017 1134   GFRNONAA >60 12/02/2023 1227     VAS US  ABI WITH/WO TBI Result Date: 11/26/2023  LOWER EXTREMITY DOPPLER STUDY Patient Name:  Cece Milhouse  Date of Exam:   11/25/2023 Medical Rec #: [id number]              Accession #:  [id number] Date of Birth: August 17, 1945              Patient Gender: F Patient Age:   72 years Exam Location:  [facility] Procedure:      VAS US  ABI WITH/WO TBI Referring Phys: ORVIN DARING --------------------------------------------------------------------------------  Indications: Claudication, and peripheral artery disease. High Risk Factors: Hypertension, hyperlipidemia, current smoker. Other Factors: RIght leg edema and pain.  Vascular Interventions: 05/19/2018: Angiogram and Selective Angiogram, PTA's of                         Lt ATA and Distal Popliteal Artery. PTA's of the Left                         SFA and Popliteal Artery. Performing Technologist: Donnice Charnley RVT  Examination Guidelines: A complete evaluation includes at minimum, Doppler waveform signals and systolic blood pressure reading at the level of bilateral brachial, anterior tibial, and posterior tibial arteries, when vessel segments are accessible. Bilateral testing is considered an integral part of a complete  examination. Photoelectric Plethysmograph (PPG) waveforms and toe systolic pressure readings are included as required and additional duplex testing as needed. Limited examinations for reoccurring indications may be performed as noted.  ABI Findings: +---------+------------------+-----+----------+--------+ Right    Rt Pressure (mmHg)IndexWaveform  Comment  +---------+------------------+-----+----------+--------+ Brachial 147                                       +---------+------------------+-----+----------+--------+ PTA      51                0.35 monophasic         +---------+------------------+-----+----------+--------+ DP       50                0.34 monophasic         +---------+------------------+-----+----------+--------+ Great Toe0                 0.00                    +---------+------------------+-----+----------+--------+ +---------+------------------+-----+----------+-------+ Left     Lt Pressure (mmHg)IndexWaveform  Comment +---------+------------------+-----+----------+-------+ Brachial 143                                      +---------+------------------+-----+----------+-------+ PTA      119               0.81 monophasic        +---------+------------------+-----+----------+-------+ DP       106               0.72 biphasic          +---------+------------------+-----+----------+-------+ Great Toe67                0.46                   +---------+------------------+-----+----------+-------+ +-------+-----------+-----------+------------+------------+ ABI/TBIToday's ABIToday's TBIPrevious ABIPrevious TBI +-------+-----------+-----------+------------+------------+ Right  0.35       0.00       0.81        0.52         +-------+-----------+-----------+------------+------------+ Left   0.81       0.46       0.87  0.60         +-------+-----------+-----------+------------+------------+  Right ABIs appear decreased  compared to prior study on 08/28/2022. Left ABIs appear essentially unchanged compared to prior study on 08/28/2022.  Summary: Right: Resting right ankle-brachial index indicates severe right lower extremity arterial disease. The right toe-brachial index is abnormal. Limited imaging showed >50% Popliteal artery stenosis. Left: Resting left ankle-brachial index indicates mild left lower extremity arterial disease. The left toe-brachial index is abnormal. *See table(s) above for measurements and observations.  Electronically signed by Selinda Gu MD on 11/26/2023 at 7:13:10 AM.    Final        Assessment & Plan:   1. Peripheral arterial disease with history of revascularization (HCC) (Primary) Recommend:  The patient is status post successful angiogram with intervention.  The patient reports that the claudication symptoms and leg pain has improved.   The patient denies lifestyle limiting changes at this point in time.  No further invasive studies, angiography or surgery at this time. The patient should continue walking and begin a more formal exercise program. Patients Right Lower Extremity ABI today is .90 The patient should continue antiplatelet therapy and aggressive treatment of the lipid abnormalities  Continued surveillance is indicated as atherosclerosis is likely to progress with time.    Patient should undergo noninvasive studies as ordered. The patient will follow up with me to review the studies in 3 months.   2. 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 Patient ABI of the left lower extremity today is .56  Given the severity of the patient's severe left lower extremity symptoms the patient should undergo angiography with the hope for intervention.  Risk and benefits were reviewed the  patient.  Indications for the procedure were reviewed.  All questions were answered, the patient agrees to proceed with left lower extremity angiography and possible intervention.   The patient should continue walking and begin a more formal exercise program.  The patient should continue antiplatelet therapy and aggressive treatment of the lipid abnormalities  The patient will follow up with me after the angiogram.   3. Benign essential hypertension Continue antihypertensive medications as already ordered, these medications have been reviewed and there are no changes at this time.  4. Type 2 diabetes mellitus without complication, without long-term current use of insulin (HCC) Continue hypoglycemic medications as already ordered, these medications have been reviewed and there are no changes at this time.  Hgb A1C to be monitored as already arranged by primary service   Current Outpatient Medications on File Prior to Visit  Medication Sig Dispense Refill   aspirin  81 MG chewable tablet Chew 1 tablet (81 mg total) by mouth 2 (two) times daily. 60 tablet 0   atenolol  (TENORMIN ) 50 MG tablet Take 50 mg by mouth at bedtime.     Cholecalciferol (VITAMIN D) 2000 units CAPS Take 1 capsule by mouth daily.     clopidogrel  (PLAVIX ) 75 MG tablet Take 1 tablet (75 mg total) by mouth daily. 30 tablet 11   docusate sodium  (COLACE) 100 MG capsule Take 1 capsule (100 mg total) by mouth 2 (two) times daily. 30 capsule 0   fluticasone (FLONASE) 50 MCG/ACT nasal spray Place 1 spray into both nostrils daily as needed for allergies.      HYDROcodone -acetaminophen  (NORCO/VICODIN) 5-325 MG tablet Take 1 tablet by  mouth every 4 (four) hours as needed for moderate pain (pain score 4-6). 30 tablet 0   lisinopril  (PRINIVIL ,ZESTRIL ) 20 MG tablet Take 20 mg by mouth at bedtime.     methocarbamol  (ROBAXIN -750) 750 MG tablet Take 1 tablet (750 mg total) by mouth every 8 (eight) hours as needed for muscle spasms. 60 tablet  0   Omega-3 Fatty Acids (FISH OIL PO) Take 1 capsule by mouth every other day.     No current facility-administered medications on file prior to visit.    There are no Patient Instructions on file for this visit. No follow-ups on file.   Gwendlyn JONELLE Shank, NP

## 2024-01-10 LAB — VAS US ABI WITH/WO TBI
Left ABI: 0.56
Right ABI: 0.93

## 2024-01-12 ENCOUNTER — Telehealth (INDEPENDENT_AMBULATORY_CARE_PROVIDER_SITE_OTHER): Payer: Self-pay

## 2024-01-12 NOTE — Telephone Encounter (Signed)
 Spoke with the patient and she wanted a callback tomorrow to make sure that 01/20/24 will work for her.

## 2024-01-13 NOTE — Telephone Encounter (Signed)
 Spoke with the patient's spouse and she is scheduled with Dr. Marea for a LLE angio on 01/20/24 with a 8:30 am arrival time to the [facility]. Pre-procedure instructions were discussed and will be mailed.

## 2024-01-20 ENCOUNTER — Encounter
Admission: RE | Disposition: A | Discharge status: Home / Self Care | Payer: Self-pay | Source: Home / Self Care | Attending: Vascular Surgery

## 2024-01-20 ENCOUNTER — Encounter: Payer: Self-pay | Admitting: Vascular Surgery

## 2024-01-20 ENCOUNTER — Ambulatory Visit
Admission: RE | Admit: 2024-01-20 | Discharge: 2024-01-20 | Disposition: A | Discharge status: Home / Self Care | Attending: Vascular Surgery | Admitting: Vascular Surgery

## 2024-01-20 ENCOUNTER — Other Ambulatory Visit: Payer: Self-pay

## 2024-01-20 DIAGNOSIS — I70222 Atherosclerosis of native arteries of extremities with rest pain, left leg: Secondary | ICD-10-CM | POA: Diagnosis not present

## 2024-01-20 DIAGNOSIS — Z9889 Other specified postprocedural states: Secondary | ICD-10-CM

## 2024-01-20 DIAGNOSIS — I70212 Atherosclerosis of native arteries of extremities with intermittent claudication, left leg: Secondary | ICD-10-CM | POA: Diagnosis not present

## 2024-01-20 DIAGNOSIS — Z79899 Other long term (current) drug therapy: Secondary | ICD-10-CM | POA: Diagnosis not present

## 2024-01-20 DIAGNOSIS — E1151 Type 2 diabetes mellitus with diabetic peripheral angiopathy without gangrene: Secondary | ICD-10-CM | POA: Diagnosis present

## 2024-01-20 DIAGNOSIS — I10 Essential (primary) hypertension: Secondary | ICD-10-CM | POA: Insufficient documentation

## 2024-01-20 DIAGNOSIS — F17210 Nicotine dependence, cigarettes, uncomplicated: Secondary | ICD-10-CM | POA: Insufficient documentation

## 2024-01-20 DIAGNOSIS — I70229 Atherosclerosis of native arteries of extremities with rest pain, unspecified extremity: Secondary | ICD-10-CM

## 2024-01-20 LAB — CREATININE, SERUM
Creatinine, Ser: 0.85 mg/dL (ref 0.44–1.00)
GFR, Estimated: >60 mL/min (ref >60)

## 2024-01-20 SURGERY — LOWER EXTREMITY ANGIOGRAPHY
Anesthesia: Moderate Sedation | Site: Leg Lower | Laterality: Left

## 2024-01-20 MED ORDER — MIDAZOLAM HCL 2 MG/2ML IJ SOLN
INTRAMUSCULAR | Status: AC
  Filled 2024-01-20: qty 4

## 2024-01-20 MED ORDER — IODIXANOL 320 MG/ML IV SOLN
INTRAVENOUS | Status: DC | PRN
  Administered 2024-01-20: 45 mL via INTRA_ARTERIAL

## 2024-01-20 MED ORDER — SODIUM CHLORIDE 0.9 % IV SOLN: INTRAVENOUS | Status: DC

## 2024-01-20 MED ORDER — CEFAZOLIN SODIUM-DEXTROSE 2-4 GM/100ML-% IV SOLN
INTRAVENOUS | Status: AC
  Filled 2024-01-20: qty 100

## 2024-01-20 MED ORDER — HEPARIN SODIUM (PORCINE) 1000 UNIT/ML IJ SOLN
INTRAMUSCULAR | Status: AC
  Filled 2024-01-20: qty 10

## 2024-01-20 MED ORDER — LIDOCAINE-EPINEPHRINE (PF) 1 %-1:200000 IJ SOLN
INTRAMUSCULAR | Status: DC | PRN
  Administered 2024-01-20: 10 mL via INTRADERMAL

## 2024-01-20 MED ORDER — MIDAZOLAM HCL 2 MG/ML PO SYRP: 8.0000 mg | ORAL_SOLUTION | Freq: Once | ORAL | Status: DC | PRN

## 2024-01-20 MED ORDER — LABETALOL HCL 5 MG/ML IV SOLN: 10.0000 mg | INTRAVENOUS | Status: DC | PRN

## 2024-01-20 MED ORDER — SODIUM CHLORIDE 0.9 % IV SOLN: 250.0000 mL | INTRAVENOUS | Status: DC | PRN

## 2024-01-20 MED ORDER — ONDANSETRON HCL 4 MG/2ML IJ SOLN: 4.0000 mg | Freq: Four times a day (QID) | INTRAMUSCULAR | Status: DC | PRN

## 2024-01-20 MED ORDER — SODIUM CHLORIDE 0.9% FLUSH: 3.0000 mL | Freq: Two times a day (BID) | INTRAVENOUS | Status: DC

## 2024-01-20 MED ORDER — ACETAMINOPHEN 325 MG PO TABS: 650.0000 mg | ORAL_TABLET | ORAL | Status: DC | PRN

## 2024-01-20 MED ORDER — MIDAZOLAM HCL 2 MG/2ML IJ SOLN
INTRAMUSCULAR | Status: DC | PRN
  Administered 2024-01-20: 2 mg via INTRAVENOUS

## 2024-01-20 MED ORDER — HYDRALAZINE HCL 20 MG/ML IJ SOLN: 5.0000 mg | INTRAMUSCULAR | Status: DC | PRN

## 2024-01-20 MED ORDER — METHYLPREDNISOLONE SODIUM SUCC 125 MG IJ SOLR: 125.0000 mg | Freq: Once | INTRAMUSCULAR | Status: DC | PRN

## 2024-01-20 MED ORDER — CEFAZOLIN SODIUM-DEXTROSE 2-4 GM/100ML-% IV SOLN
2.0000 g | INTRAVENOUS | Status: AC
  Administered 2024-01-20: 2 g via INTRAVENOUS

## 2024-01-20 MED ORDER — FENTANYL CITRATE (PF) 100 MCG/2ML IJ SOLN
INTRAMUSCULAR | Status: AC
Start: 2024-01-20 — End: 2024-01-20
  Filled 2024-01-20: qty 2

## 2024-01-20 MED ORDER — HEPARIN (PORCINE) IN NACL 2000-0.9 UNIT/L-% IV SOLN
INTRAVENOUS | Status: DC | PRN
  Administered 2024-01-20: 1000 mL

## 2024-01-20 MED ORDER — HYDROMORPHONE HCL 1 MG/ML IJ SOLN: 1.0000 mg | Freq: Once | INTRAMUSCULAR | Status: DC | PRN

## 2024-01-20 MED ORDER — FENTANYL CITRATE (PF) 100 MCG/2ML IJ SOLN
INTRAMUSCULAR | Status: DC | PRN
  Administered 2024-01-20: 50 ug via INTRAVENOUS
  Administered 2024-01-20: 25 ug via INTRAVENOUS

## 2024-01-20 MED ORDER — HEPARIN SODIUM (PORCINE) 1000 UNIT/ML IJ SOLN
INTRAMUSCULAR | Status: DC | PRN
  Administered 2024-01-20: 5000 [IU] via INTRAVENOUS

## 2024-01-20 MED ORDER — DIPHENHYDRAMINE HCL 50 MG/ML IJ SOLN: 50.0000 mg | Freq: Once | INTRAMUSCULAR | Status: DC | PRN

## 2024-01-20 MED ORDER — FAMOTIDINE 20 MG PO TABS: 40.0000 mg | ORAL_TABLET | Freq: Once | ORAL | Status: DC | PRN

## 2024-01-20 MED ORDER — SODIUM CHLORIDE 0.9% FLUSH: 3.0000 mL | INTRAVENOUS | Status: DC | PRN

## 2024-01-20 SURGICAL SUPPLY — 17 items
BALLOON LUTONIX 018 4X300X130 (BALLOONS) IMPLANT
BALLOON ULTRVS 018 2.5X100X150 (BALLOONS) IMPLANT
CATH ANGIO 5F PIGTAIL 65CM (CATHETERS) IMPLANT
CATH BEACON TIP VERT 5FR 125 (CATHETERS) IMPLANT
CATH ROTAREX 135 6FR (CATHETERS) IMPLANT
COVER PROBE ULTRASOUND 5X96 (MISCELLANEOUS) IMPLANT
DEVICE VASC CLSR CELT ART 6 (Vascular Products) IMPLANT
GLIDEWIRE ADV .035X260CM (WIRE) IMPLANT
KIT ENCORE 40 (KITS) IMPLANT
PACK ANGIOGRAPHY (CUSTOM PROCEDURE TRAY) ×1 IMPLANT
SHEATH BRITE TIP 5FRX11 (SHEATH) IMPLANT
SHEATH BRITE TIP 6FRX11 (SHEATH) IMPLANT
SHEATH RAABE 6FR (SHEATH) IMPLANT
SYR MEDRAD MARK 7 150ML (SYRINGE) IMPLANT
TUBING CONTRAST HIGH PRESS 72 (TUBING) IMPLANT
WIRE G V18X300CM (WIRE) IMPLANT
WIRE J 3MM .035X145CM (WIRE) IMPLANT

## 2024-01-20 NOTE — Op Note (Signed)
 [facility]  Percutaneous Study/Intervention Procedural Note   Date of Surgery: 01/20/2024  Surgeon(s):Saylor Murry    Assistants:none  Pre-operative Diagnosis: PAD with claudication left lower extremity  Post-operative diagnosis:  Same  Procedure(s) Performed:             1.  Ultrasound guidance for vascular access right femoral artery             2.  Catheter placement into left common femoral artery from right femoral approach             3.  Aortogram and selective left lower extremity angiogram             4.  Percutaneous transluminal angioplasty of left peroneal artery and tibioperoneal trunk with 2.5 mm diameter by 10 cm length angioplasty balloon             5.  Atherectomy of the left SFA and popliteal arteries with the [place] Rex device  6.  Angioplasty of the left SFA and popliteal arteries with 4 mm diameter by 30 cm length Lutonix drug-coated angioplasty balloon             7.  Celt closure device right femoral artery  EBL: 50 cc  Contrast: 45 cc  Fluoro Time: 7 minutes  Moderate Conscious Sedation Time: approximately 37 minutes using 2 mg of Versed  and 75 mcg of Fentanyl               Indications:  Patient is a 78 y.o.female with disabling claudication symptoms of the left lower extremity.  She has already undergone right lower extremity revascularization earlier this year with good results. The patient has noninvasive study showing reduced ABI on the left. The patient is brought in for angiography for further evaluation and potential treatment.  Risks and benefits are discussed and informed consent is obtained.   Procedure:  The patient was identified and appropriate procedural time out was performed.  The patient was then placed supine on the table and prepped and draped in the usual sterile fashion. Moderate conscious sedation was administered during a face to face encounter with the patient throughout the procedure with my supervision of the RN  administering medicines and monitoring the patient's vital signs, pulse oximetry, telemetry and mental status throughout from the start of the procedure until the patient was taken to the recovery room. Ultrasound was used to evaluate the right common femoral artery.  It was patent .  A digital ultrasound image was acquired.  A Seldinger needle was used to access the right common femoral artery under direct ultrasound guidance and a permanent image was performed.  A 0.035 J wire was advanced without resistance and a 5Fr sheath was placed.  Pigtail catheter was placed into the aorta and an AP aortogram was performed. This demonstrated normal renal arteries and normal aorta and iliac segments without significant stenosis. I then crossed the aortic bifurcation and advanced to the left femoral head. Selective left lower extremity angiogram was then performed. This demonstrated at the left common femoral artery and proximal superficial femoral artery were patent.  In the midsegment the SFA had a very high-grade stenosis of greater than 90%.  There was then diffuse disease in the distal SFA and popliteal artery with multiple areas of greater than 60% stenosis.  There was a very short tibioperoneal trunk with moderate stenosis in the 50 to 60% range feeding the posterior tibial and peroneal arteries.  The posterior tibial artery did not  have significant disease.  The peroneal artery had about an 80% stenosis in the proximal segment.  The anterior tibial artery did not have significant disease. It was felt that it was in the patient's best interest to proceed with intervention after these images to avoid a second procedure and a larger amount of contrast and fluoroscopy based off of the findings from the initial angiogram. The patient was systemically heparinized and a 6 [place] Ansell sheath was then placed over the [employer] wire. I then used a Kumpe catheter and the advantage wire to navigate through the SFA and  popliteal disease and then down into the tibioperoneal trunk and proximal peroneal artery.  I then exchanged for a V18 wire which used to cross the peroneal stenosis and parked distally without difficulty.  The diagnostic catheter was then removed.  2 passes were then made with the [place] Rex device to perform atherectomy in the left SFA and popliteal arteries.  Following this, better flow channel was seen.  We then performed angioplasty of the tibioperoneal trunk and proximal peroneal artery with a 2.5 mm diameter by 10 cm length angioplasty balloon inflated to 10 atm for 1 minute.  The SFA and popliteal artery was then addressed with a 4 mm diameter by 30 cm length angioplasty balloon inflated to 12 atm for 1 minute.  Completion imaging showed marked improvement with good flow.  The peroneal artery and tibioperoneal trunk appear to have less than 25% residual stenosis.  There appeared to be 20% or less residual stenosis in the SFA and popliteal arteries. I elected to terminate the procedure. The sheath was removed and Celt closure device was deployed in the right femoral artery with excellent hemostatic result. The patient was taken to the recovery room in stable condition having tolerated the procedure well.  Findings:               Aortogram:  This demonstrated normal renal arteries and normal aorta and iliac segments without significant stenosis.             Left Lower Extremity:  This demonstrated at the left common femoral artery and proximal superficial femoral artery were patent.  In the midsegment the SFA had a very high-grade stenosis of greater than 90%.  There was then diffuse disease in the distal SFA and popliteal artery with multiple areas of greater than 60% stenosis.  There was a very short tibioperoneal trunk with moderate stenosis in the 50 to 60% range feeding the posterior tibial and peroneal arteries.  The posterior tibial artery did not have significant disease.  The peroneal artery had  about an 80% stenosis in the proximal segment.  The anterior tibial artery did not have significant disease.   Disposition: Patient was taken to the recovery room in stable condition having tolerated the procedure well.  Complications: None  Selinda Gu 01/20/2024 10:40 AM   This note was created with Dragon Medical transcription system. Any errors in dictation are purely unintentional.

## 2024-01-20 NOTE — Progress Notes (Signed)
 Dr. Marea at bedside speaking with pt. And her spouse re: procedural results. Both verbalized understanding of conversation.

## 2024-01-20 NOTE — Interval H&P Note (Signed)
 History and Physical Interval Note:  01/20/2024 9:02 AM  Lynn Wood  has presented today for surgery, with the diagnosis of LLE Angio   ASO w rest pain.  The various methods of treatment have been discussed with the patient and family. After consideration of risks, benefits and other options for treatment, the patient has consented to  Procedure(s): Lower Extremity Angiography (Left) as a surgical intervention.  The patient's history has been reviewed, patient examined, no change in status, stable for surgery.  I have reviewed the patient's chart and labs.  Questions were answered to the patient's satisfaction.     Amirrah Quigley

## 2024-02-16 ENCOUNTER — Other Ambulatory Visit (INDEPENDENT_AMBULATORY_CARE_PROVIDER_SITE_OTHER): Payer: Self-pay | Admitting: Vascular Surgery

## 2024-02-16 DIAGNOSIS — Z9889 Other specified postprocedural states: Secondary | ICD-10-CM

## 2024-02-17 ENCOUNTER — Encounter (INDEPENDENT_AMBULATORY_CARE_PROVIDER_SITE_OTHER)

## 2024-02-17 ENCOUNTER — Ambulatory Visit (INDEPENDENT_AMBULATORY_CARE_PROVIDER_SITE_OTHER): Admitting: Nurse Practitioner

## 2024-03-13 ENCOUNTER — Telehealth (INDEPENDENT_AMBULATORY_CARE_PROVIDER_SITE_OTHER): Payer: Self-pay | Admitting: Vascular Surgery

## 2024-03-13 NOTE — Telephone Encounter (Signed)
 Spoken to patient's husband, Norleen (on [place]) who stated that patient need to have an appointment because she is in pain.  But patient no show the appointment on 02/17/2024 with Orvin.  The left leg pain for 2 weeks, it is the same pain as before. It wakes her up at night, constant pain around above the knee to below the knee. Pain scale is a 9.  Please advise.

## 2024-03-13 NOTE — Telephone Encounter (Signed)
 You can reschedule the appointment that was no showed with the studies

## 2024-04-10 ENCOUNTER — Ambulatory Visit (INDEPENDENT_AMBULATORY_CARE_PROVIDER_SITE_OTHER)

## 2024-04-10 ENCOUNTER — Ambulatory Visit (INDEPENDENT_AMBULATORY_CARE_PROVIDER_SITE_OTHER): Admitting: Vascular Surgery

## 2024-04-10 ENCOUNTER — Encounter (INDEPENDENT_AMBULATORY_CARE_PROVIDER_SITE_OTHER): Payer: Self-pay | Admitting: Vascular Surgery

## 2024-04-10 VITALS — BP 122/72 | HR 73 | Resp 18

## 2024-04-10 DIAGNOSIS — E7800 Pure hypercholesterolemia, unspecified: Secondary | ICD-10-CM

## 2024-04-10 DIAGNOSIS — E119 Type 2 diabetes mellitus without complications: Secondary | ICD-10-CM

## 2024-04-10 DIAGNOSIS — I10 Essential (primary) hypertension: Secondary | ICD-10-CM

## 2024-04-10 DIAGNOSIS — Z9889 Other specified postprocedural states: Secondary | ICD-10-CM | POA: Diagnosis not present

## 2024-04-10 DIAGNOSIS — I739 Peripheral vascular disease, unspecified: Secondary | ICD-10-CM

## 2024-04-10 NOTE — Progress Notes (Unsigned)
 Subjective:    Patient ID: Lynn Wood, female    DOB: June 07, 1946, 78 y.o.   MRN: [id number] Chief Complaint  Patient presents with  . Follow-up    [facility] 4 week with abi    HPI  Review of Systems     Objective:   Physical Exam  BP 122/72   Pulse 73   Resp 18   Past Medical History:  Diagnosis Date  . Arthritis   . Benign neoplasm of colon   . Breast cancer (HCC) 08/2016   left  . Cancer (HCC) 06/2016   left breast  . Carpal tunnel syndrome   . DCIS (ductal carcinoma in situ) of breast   . Diabetes mellitus without complication (HCC)   . Hypertension   . Nontoxic uninodular goiter   . Pre-diabetes   . Pure hypercholesterolemia   . Vaginal vault prolapse   . Vitamin D deficiency     Social History   Socioeconomic History  . Marital status: Married    Spouse name: Norleen  . Number of children: 2  . Years of education: 47  . Highest education level: Not on file  Occupational History  . Occupation: retired  Tobacco Use  . Smoking status: Light Smoker    Current packs/day: 0.25    Average packs/day: 0.3 packs/day for 57.8 years (14.5 ttl pk-yrs)    Types: Cigarettes    Start date: 19  . Smokeless tobacco: Never  . Tobacco comments:    1 cigarette every 1-2 weeks  Vaping Use  . Vaping status: Never Used  Substance and Sexual Activity  . Alcohol use: No  . Drug use: No  . Sexual activity: Not on file  Other Topics Concern  . Not on file  Social History Narrative   Lives with husband in a one story home.  Has 2 children.  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  Education: college.    Social Drivers of Health   Financial Resource Strain: Medium Risk (10/18/2023)   [facility]   Overall Financial Resource Strain (CARDIA)   . Difficulty of Paying Living Expenses: Somewhat hard  Food Insecurity: Food Insecurity Present (10/18/2023)   [facility]   Hunger Vital Sign   . Within the past 12 months, you worried that your food would run out before you got the money to buy more.: Sometimes true   . Within the past 12 months, the food you bought just didn't last and you didn't have money to get more.: Sometimes true  Transportation Needs: No Transportation Needs (10/18/2023)   Received from [facility]   [facility] - Transportation   . In the past 12 months, has lack of transportation kept you from medical appointments or from getting medications?: No   . Lack of Transportation (Non-Medical): No  Physical Activity: Not on file  Stress: Not on file  Social Connections: Not on file  Intimate Partner Violence: Not on file    Past Surgical History:  Procedure Laterality Date  . COLONOSCOPY WITH PROPOFOL  N/A 01/22/2015   Procedure: COLONOSCOPY WITH PROPOFOL ;  Surgeon: Gladis RAYMOND Mariner, MD;  [facility];  Service: Endoscopy;  Laterality: N/A;  . COLONOSCOPY WITH PROPOFOL  N/A 05/07/2020   Procedure: COLONOSCOPY WITH PROPOFOL ;  Surgeon: Maryruth Ole DASEN, MD;  Location: [facility] ENDOSCOPY;  Service: Endoscopy;  Laterality: N/A;  . GYNECOLOGIC CRYOSURGERY    .  INCISION AND DRAINAGE ABSCESS Left 07/29/2016   Procedure: Reexploration of left breast mastectomy site;  Surgeon: Laneta JULIANNA Luna, MD;  Location: [facility] ORS;  Service: General;  Laterality: Left;  . LOWER EXTREMITY ANGIOGRAPHY Left 05/19/2018   Procedure: LOWER EXTREMITY ANGIOGRAPHY;  Surgeon: Marea Selinda RAMAN, MD;  Location: [facility] INVASIVE CV LAB;  Service: Cardiovascular;  Laterality: Left;  . LOWER EXTREMITY ANGIOGRAPHY Right 12/02/2023   Procedure: Lower Extremity Angiography;  Surgeon: Marea Selinda RAMAN, MD;  Location: [facility] INVASIVE CV LAB;  Service: Cardiovascular;  Laterality: Right;  . LOWER EXTREMITY ANGIOGRAPHY Left 01/20/2024   Procedure: Lower Extremity Angiography;  Surgeon: Marea Selinda RAMAN, MD;  Location: [facility] INVASIVE CV LAB;  Service: Cardiovascular;   Laterality: Left;  SABRA MASTECTOMY Left    March 2018  . MASTECTOMY W/ SENTINEL NODE BIOPSY Left 07/28/2016   Procedure: MASTECTOMY WITH SENTINEL LYMPH NODE BIOPSY;  Surgeon: Laneta JULIANNA Luna, MD;  Location: [facility] ORS;  Service: General;  Laterality: Left;  SABRA MULTIPLE TOOTH EXTRACTIONS  1997  . PERIPHERAL VASCULAR THROMBECTOMY Right 12/02/2023   Procedure: PERIPHERAL VASCULAR THROMBECTOMY;  Surgeon: Marea Selinda RAMAN, MD;  Location: [facility] INVASIVE CV LAB;  Service: Cardiovascular;  Laterality: Right;  . TOTAL KNEE ARTHROPLASTY Right 02/10/2022   Procedure: TOTAL KNEE ARTHROPLASTY;  Surgeon: Leora Lynwood SAUNDERS, MD;  Location: [facility] ORS;  Service: Orthopedics;  Laterality: Right;  . TUBAL LIGATION      Family History  Problem Relation Age of Onset  . Breast cancer Cousin        mat cousin  . Colon cancer Mother   . Prostate cancer Father   . Prostate cancer Brother   . Heart disease Daughter     Allergies  Allergen Reactions  . Lipitor [Atorvastatin] Other (See Comments)    Arthalgia  . Lipofen [Fenofibrate] Other (See Comments)    Arthalgia  . Mevacor [Lovastatin] Other (See Comments)    Arthalgia  . Zetia [Ezetimibe] Other (See Comments)    Arthalgia  . Zocor [Simvastatin] Other (See Comments)    Arthalgia       Latest Ref Rng & Units 02/02/2022   10:12 AM 09/13/2017   11:34 AM 03/15/2017   10:29 AM  CBC  WBC 4.0 - 10.5 K/uL 5.8  5.5  5.4   Hemoglobin 12.0 - 15.0 g/dL 87.6  87.0  87.8   Hematocrit 36.0 - 46.0 % 37.9  38.0  35.5   Platelets 150 - 400 K/uL 362  273  254       CMP     Component Value Date/Time   NA 140 02/02/2022 1012   K 3.8 02/02/2022 1012   CL 111 02/02/2022 1012   CO2 24 02/02/2022 1012   GLUCOSE 93 02/02/2022 1012   BUN 24 (H) 12/02/2023 1227   CREATININE 0.85 01/20/2024 0904   CALCIUM 9.2 02/02/2022 1012   PROT 7.3 09/13/2017 1134   ALBUMIN 4.0 09/13/2017 1134   AST 19 09/13/2017 1134   ALT 11 (L) 09/13/2017 1134   ALKPHOS 79 09/13/2017 1134   BILITOT  0.4 09/13/2017 1134   GFRNONAA >60 01/20/2024 0904     No results found.     Assessment & Plan:   1. Peripheral arterial disease with history of revascularization (Primary) ***  2. Benign essential hypertension ***  3. Type 2 diabetes mellitus without complication, without long-term current use of insulin (HCC) ***  4. Pure hypercholesterolemia ***   Current Outpatient Medications on File Prior to Visit  Medication Sig  Dispense Refill  . aspirin  81 MG chewable tablet Chew 1 tablet (81 mg total) by mouth 2 (two) times daily. 60 tablet 0  . atenolol  (TENORMIN ) 50 MG tablet Take 50 mg by mouth at bedtime.    . Cholecalciferol (VITAMIN D) 2000 units CAPS Take 1 capsule by mouth daily.    . clopidogrel  (PLAVIX ) 75 MG tablet Take 1 tablet (75 mg total) by mouth daily. 30 tablet 11  . fluticasone (FLONASE) 50 MCG/ACT nasal spray Place 1 spray into both nostrils daily as needed for allergies.     . lisinopril  (PRINIVIL ,ZESTRIL ) 20 MG tablet Take 20 mg by mouth at bedtime.    . Omega-3 Fatty Acids (FISH OIL PO) Take 1 capsule by mouth every other day.    . docusate sodium  (COLACE) 100 MG capsule Take 1 capsule (100 mg total) by mouth 2 (two) times daily. (Patient not taking: Reported on 04/10/2024) 30 capsule 0  . HYDROcodone -acetaminophen  (NORCO/VICODIN) 5-325 MG tablet Take 1 tablet by mouth every 4 (four) hours as needed for moderate pain (pain score 4-6). (Patient not taking: Reported on 04/10/2024) 30 tablet 0  . methocarbamol  (ROBAXIN -750) 750 MG tablet Take 1 tablet (750 mg total) by mouth every 8 (eight) hours as needed for muscle spasms. (Patient not taking: Reported on 04/10/2024) 60 tablet 0   No current facility-administered medications on file prior to visit.    There are no Patient Instructions on file for this visit. No follow-ups on file.   Gwendlyn JONELLE Shank, NP

## 2024-04-12 LAB — VAS US ABI WITH/WO TBI
Left ABI: 0.99
Right ABI: 1.01

## 2024-06-02 ENCOUNTER — Other Ambulatory Visit: Payer: Self-pay | Admitting: Gerontology

## 2024-06-02 DIAGNOSIS — M25552 Pain in left hip: Secondary | ICD-10-CM

## 2024-06-02 DIAGNOSIS — M79605 Pain in left leg: Secondary | ICD-10-CM

## 2024-06-12 ENCOUNTER — Ambulatory Visit
Admission: RE | Admit: 2024-06-12 | Discharge: 2024-06-12 | Disposition: A | Source: Ambulatory Visit | Attending: Gerontology

## 2024-06-12 ENCOUNTER — Ambulatory Visit
Admission: RE | Admit: 2024-06-12 | Discharge: 2024-06-12 | Disposition: A | Discharge status: Home / Self Care | Source: Ambulatory Visit | Attending: Gerontology | Admitting: Gerontology

## 2024-06-12 DIAGNOSIS — M25552 Pain in left hip: Secondary | ICD-10-CM

## 2024-06-12 DIAGNOSIS — M79605 Pain in left leg: Secondary | ICD-10-CM

## 2024-06-24 ENCOUNTER — Ambulatory Visit: Admission: RE | Admit: 2024-06-24 | Source: Ambulatory Visit

## 2024-06-24 ENCOUNTER — Ambulatory Visit

## 2024-06-27 ENCOUNTER — Ambulatory Visit
Admission: RE | Admit: 2024-06-27 | Discharge: 2024-06-27 | Disposition: A | Discharge status: Home / Self Care | Source: Ambulatory Visit | Attending: Gerontology | Admitting: Gerontology

## 2024-06-27 ENCOUNTER — Ambulatory Visit: Admission: RE | Admit: 2024-06-27 | Discharge: 2024-06-27 | Attending: Gerontology

## 2024-06-27 DIAGNOSIS — M25552 Pain in left hip: Secondary | ICD-10-CM | POA: Diagnosis present

## 2024-06-27 DIAGNOSIS — M79605 Pain in left leg: Secondary | ICD-10-CM | POA: Diagnosis present

## 2024-07-07 ENCOUNTER — Telehealth (INDEPENDENT_AMBULATORY_CARE_PROVIDER_SITE_OTHER): Payer: Self-pay

## 2024-07-07 NOTE — Telephone Encounter (Signed)
 Spoke with the patient's spouse, he stated the patient was in pain and he took her to [facility] to be seen for the pain and was told to call our office. I advised that we will get her in to be seen. Front office will contact the patient to schedule.

## 2024-07-11 ENCOUNTER — Other Ambulatory Visit (INDEPENDENT_AMBULATORY_CARE_PROVIDER_SITE_OTHER): Payer: Self-pay | Admitting: Vascular Surgery

## 2024-07-11 DIAGNOSIS — Z9889 Other specified postprocedural states: Secondary | ICD-10-CM

## 2024-07-13 ENCOUNTER — Ambulatory Visit (INDEPENDENT_AMBULATORY_CARE_PROVIDER_SITE_OTHER): Admitting: Nurse Practitioner

## 2024-07-13 ENCOUNTER — Encounter (INDEPENDENT_AMBULATORY_CARE_PROVIDER_SITE_OTHER)

## 2024-07-13 ENCOUNTER — Encounter (INDEPENDENT_AMBULATORY_CARE_PROVIDER_SITE_OTHER): Payer: Self-pay | Admitting: Nurse Practitioner

## 2024-07-13 ENCOUNTER — Other Ambulatory Visit (INDEPENDENT_AMBULATORY_CARE_PROVIDER_SITE_OTHER)

## 2024-07-13 VITALS — BP 126/71 | HR 59 | Resp 18 | Ht 64.0 in | Wt 145.0 lb

## 2024-07-13 DIAGNOSIS — I70222 Atherosclerosis of native arteries of extremities with rest pain, left leg: Secondary | ICD-10-CM | POA: Diagnosis not present

## 2024-07-13 DIAGNOSIS — I70221 Atherosclerosis of native arteries of extremities with rest pain, right leg: Secondary | ICD-10-CM

## 2024-07-13 DIAGNOSIS — Z9889 Other specified postprocedural states: Secondary | ICD-10-CM

## 2024-07-13 DIAGNOSIS — I739 Peripheral vascular disease, unspecified: Secondary | ICD-10-CM

## 2024-07-13 DIAGNOSIS — I10 Essential (primary) hypertension: Secondary | ICD-10-CM

## 2024-07-13 NOTE — H&P (View-Only) (Signed)
 "  Subjective:    Patient ID: Lynn Wood, female    DOB: 1945-09-05, 79 y.o.   MRN: [id number] Chief Complaint  Patient presents with   Follow-up    3 month follow up ABI + Arterial     HPI  Discussed the use of AI scribe software for clinical note transcription with the patient, who gave verbal consent to proceed.  History of Present Illness Lynn Wood is a 79 year old female with peripheral arterial disease, and prior stroke who presents with severe, worsening bilateral lower extremity pain and swelling.  Severe bilateral lower extremity pain has been present for several months, predating an October 2025 vascular evaluation. Pain is most pronounced in the right leg and is described as constant aching with intermittent cramping and tightness in the right thigh. Nocturnal pain is severe, frequently awakening her and causing significant distress. She is unable to sleep or rest due to the pain and has experienced approximately 20 pounds of weight loss attributed to inability to eat secondary to pain. Multiple emergency department visits and outpatient evaluations have provided minimal relief, despite use of oxycodone  10 mg and acetaminophen  650 mg, which occasionally cause excessive sedation without adequate analgesia.  Swelling is present in the right lower extremity, particularly at the ankle, where she notes a palpable mass. She reports a sore on the left ankle that has been described as malodorous, with a history of antibiotic treatment and mention of a cyst in the same region. She has undergone prior angiograms and stent placements for peripheral arterial disease, with documented improvement in blood flow in October 2025. However, recent studies at [facility] revealed a significant decline in ankle-brachial indices, with right ABI decreasing to 0.26 and left ABI to 0.79, compared to prior values of 1.01 (right) and 0.99 (left).. She had a stroke in April 2025 and quit  smoking thereafter. She does not currently use tobacco.    Results Diagnostic Ankle-Brachial Index (ABI): Right ABI 0.26 decreased from 1.01 on 03/2024; Left ABI 0.79 decreased from 0.99 on 03/2024.  Additional arterial duplex shows a 75 to 99% stenosis of the distal popliteal artery.  There is dampened flow down the posterior tibial artery.  Her TP trunk is also occluded.    Review of Systems  Cardiovascular:        Rest pain  Musculoskeletal:  Positive for gait problem.  Skin:  Positive for wound.  All other systems reviewed and are negative.      Objective:   Physical Exam Vitals reviewed.  HENT:     Head: Normocephalic.  Cardiovascular:     Rate and Rhythm: Normal rate.     Pulses: Normal pulses.  Skin:    General: Skin is warm and dry.  Neurological:     Mental Status: She is alert and oriented to person, place, and time.     Motor: Weakness present.     Gait: Gait abnormal.  Psychiatric:        Mood and Affect: Mood normal.        Behavior: Behavior normal.        Thought Content: Thought content normal.        Judgment: Judgment normal.     Physical Exam EXTREMITIES: Left ankle swollen with cyst.  BP 126/71 (BP Location: Right Arm, Patient Position: Sitting, Cuff Size: Normal)   Pulse (!) 59   Resp 18   Ht 5' 4 (1.626 m)   Wt 145 lb (65.8 kg)  BMI 24.89 kg/m   Past Medical History:  Diagnosis Date   Arthritis    Benign neoplasm of colon    Breast cancer (HCC) 08/2016   left   Cancer (HCC) 06/2016   left breast   Carpal tunnel syndrome    DCIS (ductal carcinoma in situ) of breast    Diabetes mellitus without complication (HCC)    Hypertension    Nontoxic uninodular goiter    Pre-diabetes    Pure hypercholesterolemia    Vaginal vault prolapse    Vitamin D deficiency     Social History   Socioeconomic History   Marital status: Married    Spouse name: John   Number of children: 2   Years of education: 14   Highest education level:  Not on file  Occupational History   Occupation: retired  Tobacco Use   Smoking status: Light Smoker    Current packs/day: 0.25    Average packs/day: 0.3 packs/day for 58.1 years (14.5 ttl pk-yrs)    Types: Cigarettes    Start date: 1968   Smokeless tobacco: Never   Tobacco comments:    1 cigarette every 1-2 weeks  Vaping Use   Vaping status: Never Used  Substance and Sexual Activity   Alcohol use: No   Drug use: No   Sexual activity: Not on file  Other Topics Concern   Not on file  Social History Narrative   Lives with husband in a one story home.  Has 2 children.  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  Education: college.    Social Drivers of Health   Tobacco Use: High Risk (07/13/2024)   Patient History    Smoking Tobacco Use: Light Smoker    Smokeless Tobacco Use: Never    Passive Exposure: Not on file  Financial Resource Strain: Medium Risk (07/13/2024)   Received from [facility]   Overall Financial Resource Strain (CARDIA)    Difficulty of Paying Living Expenses: Somewhat hard  Food Insecurity: Food Insecurity Present (07/13/2024)   Received from [facility]   Epic    Within the past 12 months, you worried that your food would run out before you got the money to buy more.: Sometimes true    Within the past 12 months, the food you bought just didn't last and you didn't have money to get more.: Sometimes true  Transportation Needs: No Transportation Needs (07/13/2024)   Received from [facility] - Transportation    In the past 12 months, has lack of transportation kept you from medical appointments or from getting medications?: No    Lack of Transportation (Non-Medical): No  Physical Activity: Not on file  Stress: Not on file  Social Connections: Not on file  Intimate Partner Violence: Not on file  Depression (EYV7-0): Not on file  Alcohol Screen: Not on  file  Housing: Low Risk  (07/13/2024)   Received from [facility]   Epic    In the last 12 months, was there a time when you were not able to pay the mortgage or rent on time?: No    In the past 12 months, how many times have you moved where you were living?: 0    At any time in the past 12 months, were you homeless or living in a shelter (including now)?: No  Utilities: Not At Risk (07/13/2024)   Received from  [facility]   Epic    In the past 12 months has the electric, gas, oil, or water company threatened to shut off services in your home?: No  Health Literacy: Medium Risk (09/22/2023)   Received from [facility] Literacy    How often do you need to have someone help you when you read instructions, pamphlets, or other written material from your doctor or pharmacy?: Rarely    Past Surgical History:  Procedure Laterality Date   COLONOSCOPY WITH PROPOFOL  N/A 01/22/2015   Procedure: COLONOSCOPY WITH PROPOFOL ;  Surgeon: Gladis RAYMOND Mariner, MD;  Location: [facility] ENDOSCOPY;  Service: Endoscopy;  Laterality: N/A;   COLONOSCOPY WITH PROPOFOL  N/A 05/07/2020   Procedure: COLONOSCOPY WITH PROPOFOL ;  Surgeon: Maryruth Ole DASEN, MD;  Location: [facility] ENDOSCOPY;  Service: Endoscopy;  Laterality: N/A;   GYNECOLOGIC CRYOSURGERY     INCISION AND DRAINAGE ABSCESS Left 07/29/2016   Procedure: Reexploration of left breast mastectomy site;  Surgeon: Laneta JULIANNA Luna, MD;  Location: [facility] ORS;  Service: General;  Laterality: Left;   LOWER EXTREMITY ANGIOGRAPHY Left 05/19/2018   Procedure: LOWER EXTREMITY ANGIOGRAPHY;  Surgeon: Marea Selinda RAMAN, MD;  Location: [facility] INVASIVE CV LAB;  Service: Cardiovascular;  Laterality: Left;   LOWER EXTREMITY ANGIOGRAPHY Right 12/02/2023   Procedure: Lower Extremity Angiography;  Surgeon: Marea Selinda RAMAN, MD;  Location: [facility] INVASIVE CV LAB;  Service: Cardiovascular;  Laterality: Right;   LOWER EXTREMITY ANGIOGRAPHY Left 01/20/2024    Procedure: Lower Extremity Angiography;  Surgeon: Marea Selinda RAMAN, MD;  Location: [facility] INVASIVE CV LAB;  Service: Cardiovascular;  Laterality: Left;   MASTECTOMY Left    March 2018   MASTECTOMY W/ SENTINEL NODE BIOPSY Left 07/28/2016   Procedure: MASTECTOMY WITH SENTINEL LYMPH NODE BIOPSY;  Surgeon: Laneta JULIANNA Luna, MD;  Location: [facility] ORS;  Service: General;  Laterality: Left;   MULTIPLE TOOTH EXTRACTIONS  1997   PERIPHERAL VASCULAR THROMBECTOMY Right 12/02/2023   Procedure: PERIPHERAL VASCULAR THROMBECTOMY;  Surgeon: Marea Selinda RAMAN, MD;  Location: [facility] INVASIVE CV LAB;  Service: Cardiovascular;  Laterality: Right;   TOTAL KNEE ARTHROPLASTY Right 02/10/2022   Procedure: TOTAL KNEE ARTHROPLASTY;  Surgeon: Leora Lynwood SAUNDERS, MD;  Location: [facility] ORS;  Service: Orthopedics;  Laterality: Right;   TUBAL LIGATION      Family History  Problem Relation Age of Onset   Breast cancer Cousin        mat cousin   Colon cancer Mother    Prostate cancer Father    Prostate cancer Brother    Heart disease Daughter     Allergies[1]     Latest Ref Rng & Units 02/02/2022   10:12 AM 09/13/2017   11:34 AM 03/15/2017   10:29 AM  CBC  WBC 4.0 - 10.5 K/uL 5.8  5.5  5.4   Hemoglobin 12.0 - 15.0 g/dL 87.6  87.0  87.8   Hematocrit 36.0 - 46.0 % 37.9  38.0  35.5   Platelets 150 - 400 K/uL 362  273  254       CMP     Component Value Date/Time   NA 140 02/02/2022 1012   K 3.8 02/02/2022 1012   CL 111 02/02/2022 1012   CO2 24 02/02/2022 1012   GLUCOSE 93 02/02/2022 1012   BUN 24 (H) 12/02/2023 1227   CREATININE 0.85 01/20/2024 0904   CALCIUM 9.2 02/02/2022 1012   PROT 7.3 09/13/2017 1134   ALBUMIN 4.0 09/13/2017 1134   AST 19 09/13/2017  1134   ALT 11 (L) 09/13/2017 1134   ALKPHOS 79 09/13/2017 1134   BILITOT 0.4 09/13/2017 1134   GFRNONAA >60 01/20/2024 0904     No results found.     Assessment & Plan:   1. Critical limb ischemia of right lower extremity (HCC) (Primary)  Peripheral arterial  disease with critical limb ischemia, right lower extremity Severe right lower extremity ischemic pain and swelling unresponsive to analgesics. Failed prior revascularization with declining ABI and minimal distal perfusion. Urgent intervention required. - Planned right lower extremity angiogram and possible stent placement. - Scheduled angiogram for Monday afternoon - Surgical scheduler to contact her for procedure confirmation. - Discussed expedited intervention via emergency department if pain intolerable, including possible intravenous anticoagulation and urgent angiogram however patient declined at this time to present to the emergency room but this option was given over the weekend if they feel it is necessary. - Advised continuation of current pain management regimen with caution regarding opioid-induced sedation. 2. Atherosclerosis of native artery of left lower extremity with rest pain (HCC) Peripheral arterial disease with chronic ulcer, left lower extremity Chronic left ankle ulcer with swelling and malodor due to poor perfusion. - Planned staged left lower extremity angiogram and possible revascularization approximately one week after right-sided procedure.  3. Benign essential hypertension Continue antihypertensive medications as already ordered, these medications have been reviewed and there are no changes at this time.   Assessment and Plan Assessment & Plan        Medications Ordered Prior to Encounter[2]  There are no Patient Instructions on file for this visit. Return for schedule angio .   Marlan Steward E Allesha Aronoff, NP      [1]  Allergies Allergen Reactions   Amlodipine Swelling   Penicillins Other (See Comments)    unknown   Lipitor [Atorvastatin] Other (See Comments)    Arthalgia   Lipofen [Fenofibrate] Other (See Comments)    Arthalgia   Mevacor [Lovastatin] Other (See Comments)    Arthalgia   Zetia [Ezetimibe] Other (See Comments)    Arthalgia   Zocor  [Simvastatin] Other (See Comments)    Arthalgia  [2]  Current Outpatient Medications on File Prior to Visit  Medication Sig Dispense Refill   aspirin  81 MG chewable tablet Chew 1 tablet (81 mg total) by mouth 2 (two) times daily. 60 tablet 0   atenolol  (TENORMIN ) 50 MG tablet Take 50 mg by mouth at bedtime.     Cholecalciferol (VITAMIN D) 2000 units CAPS Take 1 capsule by mouth daily.     clopidogrel  (PLAVIX ) 75 MG tablet Take 1 tablet (75 mg total) by mouth daily. 30 tablet 11   fluticasone (FLONASE) 50 MCG/ACT nasal spray Place 1 spray into both nostrils daily as needed for allergies.      lisinopril  (PRINIVIL ,ZESTRIL ) 20 MG tablet Take 20 mg by mouth at bedtime.     Omega-3 Fatty Acids (FISH OIL PO) Take 1 capsule by mouth every other day.     docusate sodium  (COLACE) 100 MG capsule Take 1 capsule (100 mg total) by mouth 2 (two) times daily. (Patient not taking: Reported on 04/10/2024) 30 capsule 0   HYDROcodone -acetaminophen  (NORCO/VICODIN) 5-325 MG tablet Take 1 tablet by mouth every 4 (four) hours as needed for moderate pain (pain score 4-6). (Patient not taking: Reported on 04/10/2024) 30 tablet 0   methocarbamol  (ROBAXIN -750) 750 MG tablet Take 1 tablet (750 mg total) by mouth every 8 (eight) hours as needed for muscle spasms. (Patient not taking: Reported  on 04/10/2024) 60 tablet 0   No current facility-administered medications on file prior to visit.   "

## 2024-07-13 NOTE — Progress Notes (Addendum)
 "  Subjective:    Patient ID: Lynn Wood, female    DOB: 1945-09-05, 79 y.o.   MRN: [id number] Chief Complaint  Patient presents with   Follow-up    3 month follow up ABI + Arterial     HPI  Discussed the use of AI scribe software for clinical note transcription with the patient, who gave verbal consent to proceed.  History of Present Illness Lynn Wood [name] is a 79 year old female with peripheral arterial disease, and prior stroke who presents with severe, worsening bilateral lower extremity pain and swelling.  Severe bilateral lower extremity pain has been present for several months, predating an October 2025 vascular evaluation. Pain is most pronounced in the right leg and is described as constant aching with intermittent cramping and tightness in the right thigh. Nocturnal pain is severe, frequently awakening her and causing significant distress. She is unable to sleep or rest due to the pain and has experienced approximately 20 pounds of weight loss attributed to inability to eat secondary to pain. Multiple emergency department visits and outpatient evaluations have provided minimal relief, despite use of oxycodone  10 mg and acetaminophen  650 mg, which occasionally cause excessive sedation without adequate analgesia.  Swelling is present in the right lower extremity, particularly at the ankle, where she notes a palpable mass. She reports a sore on the left ankle that has been described as malodorous, with a history of antibiotic treatment and mention of a cyst in the same region. She has undergone prior angiograms and stent placements for peripheral arterial disease, with documented improvement in blood flow in October 2025. However, recent studies at [facility] revealed a significant decline in ankle-brachial indices, with right ABI decreasing to 0.26 and left ABI to 0.79, compared to prior values of 1.01 (right) and 0.99 (left).. She had a stroke in April 2025 and quit  smoking thereafter. She does not currently use tobacco.    Results Diagnostic Ankle-Brachial Index (ABI): Right ABI 0.26 decreased from 1.01 on 03/2024; Left ABI 0.79 decreased from 0.99 on 03/2024.  Additional arterial duplex shows a 75 to 99% stenosis of the distal popliteal artery.  There is dampened flow down the posterior tibial artery.  Her TP trunk is also occluded.    Review of Systems  Cardiovascular:        Rest pain  Musculoskeletal:  Positive for gait problem.  Skin:  Positive for wound.  All other systems reviewed and are negative.      Objective:   Physical Exam Vitals reviewed.  HENT:     Head: Normocephalic.  Cardiovascular:     Rate and Rhythm: Normal rate.     Pulses: Normal pulses.  Skin:    General: Skin is warm and dry.  Neurological:     Mental Status: She is alert and oriented to person, place, and time.     Motor: Weakness present.     Gait: Gait abnormal.  Psychiatric:        Mood and Affect: Mood normal.        Behavior: Behavior normal.        Thought Content: Thought content normal.        Judgment: Judgment normal.     Physical Exam EXTREMITIES: Left ankle swollen with cyst.  BP 126/71 (BP Location: Right Arm, Patient Position: Sitting, Cuff Size: Normal)   Pulse (!) 59   Resp 18   Ht 5' 4 (1.626 m)   Wt 145 lb (65.8 kg)  BMI 24.89 kg/m   Past Medical History:  Diagnosis Date   Arthritis    Benign neoplasm of colon    Breast cancer (HCC) 08/2016   left   Cancer (HCC) 06/2016   left breast   Carpal tunnel syndrome    DCIS (ductal carcinoma in situ) of breast    Diabetes mellitus without complication (HCC)    Hypertension    Nontoxic uninodular goiter    Pre-diabetes    Pure hypercholesterolemia    Vaginal vault prolapse    Vitamin D deficiency     Social History   Socioeconomic History   Marital status: Married    Spouse name: John   Number of children: 2   Years of education: 14   Highest education level:  Not on file  Occupational History   Occupation: retired  Tobacco Use   Smoking status: Light Smoker    Current packs/day: 0.25    Average packs/day: 0.3 packs/day for 58.1 years (14.5 ttl pk-yrs)    Types: Cigarettes    Start date: 1968   Smokeless tobacco: Never   Tobacco comments:    1 cigarette every 1-2 weeks  Vaping Use   Vaping status: Never Used  Substance and Sexual Activity   Alcohol use: No   Drug use: No   Sexual activity: Not on file  Other Topics Concern   Not on file  Social History Narrative   Lives with husband in a one story home.  Has 2 children.  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  Education: college.    Social Drivers of Health   Tobacco Use: High Risk (07/13/2024)   Patient History    Smoking Tobacco Use: Light Smoker    Smokeless Tobacco Use: Never    Passive Exposure: Not on file  Financial Resource Strain: Medium Risk (07/13/2024)   Received from [facility]   Overall Financial Resource Strain (CARDIA)    Difficulty of Paying Living Expenses: Somewhat hard  Food Insecurity: Food Insecurity Present (07/13/2024)   Received from [facility]   Epic    Within the past 12 months, you worried that your food would run out before you got the money to buy more.: Sometimes true    Within the past 12 months, the food you bought just didn't last and you didn't have money to get more.: Sometimes true  Transportation Needs: No Transportation Needs (07/13/2024)   Received from [facility] - Transportation    In the past 12 months, has lack of transportation kept you from medical appointments or from getting medications?: No    Lack of Transportation (Non-Medical): No  Physical Activity: Not on file  Stress: Not on file  Social Connections: Not on file  Intimate Partner Violence: Not on file  Depression (EYV7-0): Not on file  Alcohol Screen: Not on  file  Housing: Low Risk  (07/13/2024)   Received from [facility]   Epic    In the last 12 months, was there a time when you were not able to pay the mortgage or rent on time?: No    In the past 12 months, how many times have you moved where you were living?: 0    At any time in the past 12 months, were you homeless or living in a shelter (including now)?: No  Utilities: Not At Risk (07/13/2024)   Received from  [facility]   Epic    In the past 12 months has the electric, gas, oil, or water company threatened to shut off services in your home?: No  Health Literacy: Medium Risk (09/22/2023)   Received from [facility] Literacy    How often do you need to have someone help you when you read instructions, pamphlets, or other written material from your doctor or pharmacy?: Rarely    Past Surgical History:  Procedure Laterality Date   COLONOSCOPY WITH PROPOFOL  N/A 01/22/2015   Procedure: COLONOSCOPY WITH PROPOFOL ;  Surgeon: Gladis RAYMOND Mariner, MD;  Location: [facility] ENDOSCOPY;  Service: Endoscopy;  Laterality: N/A;   COLONOSCOPY WITH PROPOFOL  N/A 05/07/2020   Procedure: COLONOSCOPY WITH PROPOFOL ;  Surgeon: Maryruth Ole DASEN, MD;  Location: [facility] ENDOSCOPY;  Service: Endoscopy;  Laterality: N/A;   GYNECOLOGIC CRYOSURGERY     INCISION AND DRAINAGE ABSCESS Left 07/29/2016   Procedure: Reexploration of left breast mastectomy site;  Surgeon: Laneta JULIANNA Luna, MD;  Location: [facility] ORS;  Service: General;  Laterality: Left;   LOWER EXTREMITY ANGIOGRAPHY Left 05/19/2018   Procedure: LOWER EXTREMITY ANGIOGRAPHY;  Surgeon: Marea Selinda RAMAN, MD;  Location: [facility] INVASIVE CV LAB;  Service: Cardiovascular;  Laterality: Left;   LOWER EXTREMITY ANGIOGRAPHY Right 12/02/2023   Procedure: Lower Extremity Angiography;  Surgeon: Marea Selinda RAMAN, MD;  Location: [facility] INVASIVE CV LAB;  Service: Cardiovascular;  Laterality: Right;   LOWER EXTREMITY ANGIOGRAPHY Left 01/20/2024    Procedure: Lower Extremity Angiography;  Surgeon: Marea Selinda RAMAN, MD;  Location: [facility] INVASIVE CV LAB;  Service: Cardiovascular;  Laterality: Left;   MASTECTOMY Left    March 2018   MASTECTOMY W/ SENTINEL NODE BIOPSY Left 07/28/2016   Procedure: MASTECTOMY WITH SENTINEL LYMPH NODE BIOPSY;  Surgeon: Laneta JULIANNA Luna, MD;  Location: [facility] ORS;  Service: General;  Laterality: Left;   MULTIPLE TOOTH EXTRACTIONS  1997   PERIPHERAL VASCULAR THROMBECTOMY Right 12/02/2023   Procedure: PERIPHERAL VASCULAR THROMBECTOMY;  Surgeon: Marea Selinda RAMAN, MD;  Location: [facility] INVASIVE CV LAB;  Service: Cardiovascular;  Laterality: Right;   TOTAL KNEE ARTHROPLASTY Right 02/10/2022   Procedure: TOTAL KNEE ARTHROPLASTY;  Surgeon: Leora Lynwood SAUNDERS, MD;  Location: [facility] ORS;  Service: Orthopedics;  Laterality: Right;   TUBAL LIGATION      Family History  Problem Relation Age of Onset   Breast cancer Cousin        mat cousin   Colon cancer Mother    Prostate cancer Father    Prostate cancer Brother    Heart disease Daughter     Allergies[1]     Latest Ref Rng & Units 02/02/2022   10:12 AM 09/13/2017   11:34 AM 03/15/2017   10:29 AM  CBC  WBC 4.0 - 10.5 K/uL 5.8  5.5  5.4   Hemoglobin 12.0 - 15.0 g/dL 87.6  87.0  87.8   Hematocrit 36.0 - 46.0 % 37.9  38.0  35.5   Platelets 150 - 400 K/uL 362  273  254       CMP     Component Value Date/Time   NA 140 02/02/2022 1012   K 3.8 02/02/2022 1012   CL 111 02/02/2022 1012   CO2 24 02/02/2022 1012   GLUCOSE 93 02/02/2022 1012   BUN 24 (H) 12/02/2023 1227   CREATININE 0.85 01/20/2024 0904   CALCIUM 9.2 02/02/2022 1012   PROT 7.3 09/13/2017 1134   ALBUMIN 4.0 09/13/2017 1134   AST 19 09/13/2017  1134   ALT 11 (L) 09/13/2017 1134   ALKPHOS 79 09/13/2017 1134   BILITOT 0.4 09/13/2017 1134   GFRNONAA >60 01/20/2024 0904     No results found.     Assessment & Plan:   1. Critical limb ischemia of right lower extremity (HCC) (Primary)  Peripheral arterial  disease with critical limb ischemia, right lower extremity Severe right lower extremity ischemic pain and swelling unresponsive to analgesics. Failed prior revascularization with declining ABI and minimal distal perfusion. Urgent intervention required. - Planned right lower extremity angiogram and possible stent placement. - Scheduled angiogram for Monday afternoon - Surgical scheduler to contact her for procedure confirmation. - Discussed expedited intervention via emergency department if pain intolerable, including possible intravenous anticoagulation and urgent angiogram however patient declined at this time to present to the emergency room but this option was given over the weekend if they feel it is necessary. - Advised continuation of current pain management regimen with caution regarding opioid-induced sedation. 2. Atherosclerosis of native artery of left lower extremity with rest pain (HCC) Peripheral arterial disease with chronic ulcer, left lower extremity Chronic left ankle ulcer with swelling and malodor due to poor perfusion. - Planned staged left lower extremity angiogram and possible revascularization approximately one week after right-sided procedure.  3. Benign essential hypertension Continue antihypertensive medications as already ordered, these medications have been reviewed and there are no changes at this time.   Assessment and Plan Assessment & Plan        Medications Ordered Prior to Encounter[2]  There are no Patient Instructions on file for this visit. Return for schedule angio .   Marlan Steward E Allesha Aronoff, NP      [1]  Allergies Allergen Reactions   Amlodipine Swelling   Penicillins Other (See Comments)    unknown   Lipitor [Atorvastatin] Other (See Comments)    Arthalgia   Lipofen [Fenofibrate] Other (See Comments)    Arthalgia   Mevacor [Lovastatin] Other (See Comments)    Arthalgia   Zetia [Ezetimibe] Other (See Comments)    Arthalgia   Zocor  [Simvastatin] Other (See Comments)    Arthalgia  [2]  Current Outpatient Medications on File Prior to Visit  Medication Sig Dispense Refill   aspirin  81 MG chewable tablet Chew 1 tablet (81 mg total) by mouth 2 (two) times daily. 60 tablet 0   atenolol  (TENORMIN ) 50 MG tablet Take 50 mg by mouth at bedtime.     Cholecalciferol (VITAMIN D) 2000 units CAPS Take 1 capsule by mouth daily.     clopidogrel  (PLAVIX ) 75 MG tablet Take 1 tablet (75 mg total) by mouth daily. 30 tablet 11   fluticasone (FLONASE) 50 MCG/ACT nasal spray Place 1 spray into both nostrils daily as needed for allergies.      lisinopril  (PRINIVIL ,ZESTRIL ) 20 MG tablet Take 20 mg by mouth at bedtime.     Omega-3 Fatty Acids (FISH OIL PO) Take 1 capsule by mouth every other day.     docusate sodium  (COLACE) 100 MG capsule Take 1 capsule (100 mg total) by mouth 2 (two) times daily. (Patient not taking: Reported on 04/10/2024) 30 capsule 0   HYDROcodone -acetaminophen  (NORCO/VICODIN) 5-325 MG tablet Take 1 tablet by mouth every 4 (four) hours as needed for moderate pain (pain score 4-6). (Patient not taking: Reported on 04/10/2024) 30 tablet 0   methocarbamol  (ROBAXIN -750) 750 MG tablet Take 1 tablet (750 mg total) by mouth every 8 (eight) hours as needed for muscle spasms. (Patient not taking: Reported  on 04/10/2024) 60 tablet 0   No current facility-administered medications on file prior to visit.   "

## 2024-07-14 ENCOUNTER — Telehealth (INDEPENDENT_AMBULATORY_CARE_PROVIDER_SITE_OTHER): Payer: Self-pay

## 2024-07-14 NOTE — Telephone Encounter (Signed)
 Spoke with the patient's spouse and she is scheduled with Dr. Marea for a RLE angio on 07/17/24 with a 9:00 am arrival time to the [facility]. Pre-procedure instructions were discussed and patient's spouse stated he understood. Patient does not have Mychart.

## 2024-07-17 ENCOUNTER — Ambulatory Visit
Admission: RE | Admit: 2024-07-17 | Discharge: 2024-07-17 | Disposition: A | Attending: Vascular Surgery | Admitting: Vascular Surgery

## 2024-07-17 ENCOUNTER — Other Ambulatory Visit: Payer: Self-pay

## 2024-07-17 ENCOUNTER — Encounter: Admission: RE | Disposition: A | Payer: Self-pay | Source: Home / Self Care | Attending: Vascular Surgery

## 2024-07-17 ENCOUNTER — Encounter: Payer: Self-pay | Admitting: Vascular Surgery

## 2024-07-17 ENCOUNTER — Telehealth (INDEPENDENT_AMBULATORY_CARE_PROVIDER_SITE_OTHER): Payer: Self-pay

## 2024-07-17 DIAGNOSIS — Z7982 Long term (current) use of aspirin: Secondary | ICD-10-CM | POA: Insufficient documentation

## 2024-07-17 DIAGNOSIS — L97329 Non-pressure chronic ulcer of left ankle with unspecified severity: Secondary | ICD-10-CM | POA: Insufficient documentation

## 2024-07-17 DIAGNOSIS — Z87891 Personal history of nicotine dependence: Secondary | ICD-10-CM | POA: Insufficient documentation

## 2024-07-17 DIAGNOSIS — I70223 Atherosclerosis of native arteries of extremities with rest pain, bilateral legs: Secondary | ICD-10-CM | POA: Insufficient documentation

## 2024-07-17 DIAGNOSIS — I70221 Atherosclerosis of native arteries of extremities with rest pain, right leg: Secondary | ICD-10-CM

## 2024-07-17 DIAGNOSIS — E1151 Type 2 diabetes mellitus with diabetic peripheral angiopathy without gangrene: Secondary | ICD-10-CM | POA: Insufficient documentation

## 2024-07-17 DIAGNOSIS — Z7902 Long term (current) use of antithrombotics/antiplatelets: Secondary | ICD-10-CM | POA: Insufficient documentation

## 2024-07-17 DIAGNOSIS — I70243 Atherosclerosis of native arteries of left leg with ulceration of ankle: Secondary | ICD-10-CM | POA: Insufficient documentation

## 2024-07-17 DIAGNOSIS — Z79899 Other long term (current) drug therapy: Secondary | ICD-10-CM | POA: Insufficient documentation

## 2024-07-17 DIAGNOSIS — I120 Hypertensive chronic kidney disease with stage 5 chronic kidney disease or end stage renal disease: Secondary | ICD-10-CM | POA: Insufficient documentation

## 2024-07-17 DIAGNOSIS — E1122 Type 2 diabetes mellitus with diabetic chronic kidney disease: Secondary | ICD-10-CM | POA: Insufficient documentation

## 2024-07-17 DIAGNOSIS — E11621 Type 2 diabetes mellitus with foot ulcer: Secondary | ICD-10-CM | POA: Insufficient documentation

## 2024-07-17 DIAGNOSIS — N186 End stage renal disease: Secondary | ICD-10-CM | POA: Insufficient documentation

## 2024-07-17 DIAGNOSIS — M25472 Effusion, left ankle: Secondary | ICD-10-CM | POA: Insufficient documentation

## 2024-07-17 DIAGNOSIS — Z8673 Personal history of transient ischemic attack (TIA), and cerebral infarction without residual deficits: Secondary | ICD-10-CM | POA: Insufficient documentation

## 2024-07-17 DIAGNOSIS — Z992 Dependence on renal dialysis: Secondary | ICD-10-CM | POA: Insufficient documentation

## 2024-07-17 LAB — BUN: BUN: 22 mg/dL (ref 8–23)

## 2024-07-17 LAB — CREATININE, SERUM
Creatinine, Ser: 0.99 mg/dL (ref 0.44–1.00)
GFR, Estimated: 59 mL/min — ABNORMAL LOW

## 2024-07-17 MED ORDER — IODIXANOL 320 MG/ML IV SOLN
INTRAVENOUS | Status: DC | PRN
  Administered 2024-07-17: 60 mL

## 2024-07-17 MED ORDER — MIDAZOLAM HCL 2 MG/ML PO SYRP
ORAL_SOLUTION | ORAL | Status: AC
  Filled 2024-07-17: qty 5

## 2024-07-17 MED ORDER — ONDANSETRON HCL 4 MG/2ML IJ SOLN: 4.0000 mg | Freq: Four times a day (QID) | INTRAMUSCULAR | Status: DC | PRN

## 2024-07-17 MED ORDER — LABETALOL HCL 5 MG/ML IV SOLN: 10.0000 mg | INTRAVENOUS | Status: DC | PRN

## 2024-07-17 MED ORDER — CEFAZOLIN SODIUM-DEXTROSE 2-4 GM/100ML-% IV SOLN
2.0000 g | INTRAVENOUS | Status: AC
  Administered 2024-07-17: 2 g via INTRAVENOUS

## 2024-07-17 MED ORDER — FENTANYL CITRATE (PF) 100 MCG/2ML IJ SOLN
INTRAMUSCULAR | Status: AC
  Filled 2024-07-17: qty 2

## 2024-07-17 MED ORDER — MIDAZOLAM HCL (PF) 2 MG/2ML IJ SOLN
INTRAMUSCULAR | Status: DC | PRN
  Administered 2024-07-17: .5 mg via INTRAVENOUS
  Administered 2024-07-17: 1 mg via INTRAVENOUS
  Administered 2024-07-17: 2 mg via INTRAVENOUS
  Administered 2024-07-17 (×2): .5 mg via INTRAVENOUS
  Administered 2024-07-17: 1 mg via INTRAVENOUS

## 2024-07-17 MED ORDER — HYDRALAZINE HCL 20 MG/ML IJ SOLN: 5.0000 mg | INTRAMUSCULAR | Status: DC | PRN

## 2024-07-17 MED ORDER — SODIUM CHLORIDE 0.9% FLUSH: 3.0000 mL | INTRAVENOUS | Status: DC | PRN

## 2024-07-17 MED ORDER — FAMOTIDINE 20 MG PO TABS: 40.0000 mg | ORAL_TABLET | Freq: Once | ORAL | Status: DC | PRN

## 2024-07-17 MED ORDER — FENTANYL CITRATE (PF) 50 MCG/ML IJ SOSY
PREFILLED_SYRINGE | INTRAMUSCULAR | Status: AC
  Filled 2024-07-17: qty 1

## 2024-07-17 MED ORDER — SODIUM CHLORIDE 0.9% FLUSH: 3.0000 mL | Freq: Two times a day (BID) | INTRAVENOUS | Status: DC

## 2024-07-17 MED ORDER — HEPARIN (PORCINE) IN NACL 1000-0.9 UT/500ML-% IV SOLN
INTRAVENOUS | Status: DC | PRN
  Administered 2024-07-17: 1000 mL

## 2024-07-17 MED ORDER — HEPARIN SODIUM (PORCINE) 1000 UNIT/ML IJ SOLN
INTRAMUSCULAR | Status: DC | PRN
  Administered 2024-07-17: 5000 [IU] via INTRAVENOUS

## 2024-07-17 MED ORDER — SODIUM CHLORIDE 0.9 % IV SOLN: INTRAVENOUS | Status: DC

## 2024-07-17 MED ORDER — METHYLPREDNISOLONE SODIUM SUCC 125 MG IJ SOLR: 125.0000 mg | Freq: Once | INTRAMUSCULAR | Status: DC | PRN

## 2024-07-17 MED ORDER — MIDAZOLAM HCL 2 MG/2ML IJ SOLN
INTRAMUSCULAR | Status: AC
  Filled 2024-07-17: qty 4

## 2024-07-17 MED ORDER — FENTANYL CITRATE (PF) 100 MCG/2ML IJ SOLN
INTRAMUSCULAR | Status: DC | PRN
  Administered 2024-07-17 (×3): 25 ug via INTRAVENOUS
  Administered 2024-07-17 (×2): 50 ug via INTRAVENOUS
  Administered 2024-07-17: 25 ug via INTRAVENOUS

## 2024-07-17 MED ORDER — SODIUM CHLORIDE 0.9 % IV SOLN: 250.0000 mL | INTRAVENOUS | Status: DC | PRN

## 2024-07-17 MED ORDER — MIDAZOLAM HCL 2 MG/ML PO SYRP
8.0000 mg | ORAL_SOLUTION | Freq: Once | ORAL | Status: AC | PRN
  Administered 2024-07-17: 8 mg via ORAL

## 2024-07-17 MED ORDER — LABETALOL HCL 5 MG/ML IV SOLN
INTRAVENOUS | Status: AC
  Filled 2024-07-17: qty 4

## 2024-07-17 MED ORDER — HYDROMORPHONE HCL 1 MG/ML IJ SOLN
INTRAMUSCULAR | Status: AC
  Filled 2024-07-17: qty 1

## 2024-07-17 MED ORDER — ACETAMINOPHEN 325 MG PO TABS: 650.0000 mg | ORAL_TABLET | ORAL | Status: DC | PRN

## 2024-07-17 MED ORDER — HYDROMORPHONE HCL 1 MG/ML IJ SOLN
1.0000 mg | Freq: Once | INTRAMUSCULAR | Status: AC | PRN
  Administered 2024-07-17: 1 mg via INTRAVENOUS

## 2024-07-17 MED ORDER — MIDAZOLAM HCL 2 MG/2ML IJ SOLN
INTRAMUSCULAR | Status: AC
  Filled 2024-07-17: qty 2

## 2024-07-17 MED ORDER — LABETALOL HCL 5 MG/ML IV SOLN
INTRAVENOUS | Status: DC | PRN
  Administered 2024-07-17: 10 mg via INTRAVENOUS

## 2024-07-17 MED ORDER — LIDOCAINE-EPINEPHRINE (PF) 1 %-1:200000 IJ SOLN
INTRAMUSCULAR | Status: DC | PRN
  Administered 2024-07-17: 10 mL

## 2024-07-17 MED ORDER — HEPARIN SODIUM (PORCINE) 1000 UNIT/ML IJ SOLN
INTRAMUSCULAR | Status: AC
  Filled 2024-07-17: qty 10

## 2024-07-17 MED ORDER — DIPHENHYDRAMINE HCL 50 MG/ML IJ SOLN: 50.0000 mg | Freq: Once | INTRAMUSCULAR | Status: DC | PRN

## 2024-07-17 MED ORDER — CEFAZOLIN SODIUM-DEXTROSE 2-4 GM/100ML-% IV SOLN
INTRAVENOUS | Status: AC
  Filled 2024-07-17: qty 100

## 2024-07-17 NOTE — Interval H&P Note (Signed)
 History and Physical Interval Note:  07/17/2024 9:04 AM  Lynn Wood  has presented today for surgery, with the diagnosis of RLE Angio    Critical limb ischemia.  The various methods of treatment have been discussed with the patient and family. After consideration of risks, benefits and other options for treatment, the patient has consented to  Procedures: Lower Extremity Angiography (Right) LOWER EXTREMITY INTERVENTION (Right) as a surgical intervention.  The patient's history has been reviewed, patient examined, no change in status, stable for surgery.  I have reviewed the patient's chart and labs.  Questions were answered to the patient's satisfaction.     Cristi Gwynn

## 2024-07-17 NOTE — Discharge Instructions (Signed)

## 2024-07-19 ENCOUNTER — Encounter: Payer: Self-pay | Admitting: Vascular Surgery

## 2024-07-19 ENCOUNTER — Other Ambulatory Visit: Payer: Self-pay

## 2024-07-19 ENCOUNTER — Ambulatory Visit
Admission: RE | Admit: 2024-07-19 | Discharge: 2024-07-19 | Disposition: A | Attending: Vascular Surgery | Admitting: Vascular Surgery

## 2024-07-19 ENCOUNTER — Encounter: Admission: RE | Disposition: A | Payer: Self-pay | Source: Home / Self Care | Attending: Vascular Surgery

## 2024-07-19 ENCOUNTER — Ambulatory Visit: Admitting: Certified Registered"

## 2024-07-19 DIAGNOSIS — I70221 Atherosclerosis of native arteries of extremities with rest pain, right leg: Secondary | ICD-10-CM

## 2024-07-19 LAB — BUN: BUN: 19 mg/dL (ref 8–23)

## 2024-07-19 LAB — CREATININE, SERUM
Creatinine, Ser: 0.85 mg/dL (ref 0.44–1.00)
GFR, Estimated: >60 mL/min

## 2024-07-19 MED ORDER — DIPHENHYDRAMINE HCL 50 MG/ML IJ SOLN: 50.0000 mg | Freq: Once | INTRAMUSCULAR | Status: DC | PRN

## 2024-07-19 MED ORDER — PHENYLEPHRINE 80 MCG/ML (10ML) SYRINGE FOR IV PUSH (FOR BLOOD PRESSURE SUPPORT)
PREFILLED_SYRINGE | INTRAVENOUS | Status: DC | PRN
  Administered 2024-07-19: 80 ug via INTRAVENOUS
  Administered 2024-07-19 (×3): 160 ug via INTRAVENOUS
  Administered 2024-07-19: 80 ug via INTRAVENOUS
  Administered 2024-07-19 (×2): 160 ug via INTRAVENOUS

## 2024-07-19 MED ORDER — KETAMINE HCL 50 MG/5ML IJ SOSY
PREFILLED_SYRINGE | INTRAMUSCULAR | Status: AC
  Filled 2024-07-19: qty 5

## 2024-07-19 MED ORDER — ACETAMINOPHEN 325 MG PO TABS: 650.0000 mg | ORAL_TABLET | ORAL | Status: DC | PRN

## 2024-07-19 MED ORDER — ACETAMINOPHEN 10 MG/ML IV SOLN: 1000.0000 mg | Freq: Once | INTRAVENOUS | Status: DC | PRN

## 2024-07-19 MED ORDER — PHENYLEPHRINE 80 MCG/ML (10ML) SYRINGE FOR IV PUSH (FOR BLOOD PRESSURE SUPPORT)
PREFILLED_SYRINGE | INTRAVENOUS | Status: AC
  Filled 2024-07-19: qty 10

## 2024-07-19 MED ORDER — OXYCODONE HCL 5 MG/5ML PO SOLN: 5.0000 mg | Freq: Once | ORAL | Status: DC | PRN

## 2024-07-19 MED ORDER — FAMOTIDINE 20 MG PO TABS: 40.0000 mg | ORAL_TABLET | Freq: Once | ORAL | Status: DC | PRN

## 2024-07-19 MED ORDER — IODIXANOL 320 MG/ML IV SOLN
INTRAVENOUS | Status: DC | PRN
  Administered 2024-07-19: 30 mL

## 2024-07-19 MED ORDER — MIDAZOLAM HCL 2 MG/ML PO SYRP
ORAL_SOLUTION | ORAL | Status: AC
  Filled 2024-07-19: qty 5

## 2024-07-19 MED ORDER — LABETALOL HCL 5 MG/ML IV SOLN: 10.0000 mg | INTRAVENOUS | Status: DC | PRN

## 2024-07-19 MED ORDER — HYDRALAZINE HCL 20 MG/ML IJ SOLN: 5.0000 mg | INTRAMUSCULAR | Status: DC | PRN

## 2024-07-19 MED ORDER — FENTANYL CITRATE (PF) 100 MCG/2ML IJ SOLN
25.0000 ug | INTRAMUSCULAR | Status: DC | PRN
  Administered 2024-07-19 (×2): 25 ug via INTRAVENOUS

## 2024-07-19 MED ORDER — OXYCODONE HCL 5 MG PO TABS: 5.0000 mg | ORAL_TABLET | Freq: Once | ORAL | Status: DC | PRN

## 2024-07-19 MED ORDER — CEFAZOLIN SODIUM-DEXTROSE 2-4 GM/100ML-% IV SOLN
2.0000 g | INTRAVENOUS | Status: AC
  Administered 2024-07-19: 2 g via INTRAVENOUS

## 2024-07-19 MED ORDER — SODIUM CHLORIDE 0.9 % IV SOLN: 250.0000 mL | INTRAVENOUS | Status: DC | PRN

## 2024-07-19 MED ORDER — PROPOFOL 10 MG/ML IV BOLUS
INTRAVENOUS | Status: AC
  Filled 2024-07-19: qty 40

## 2024-07-19 MED ORDER — SODIUM CHLORIDE 0.9 % IV SOLN: INTRAVENOUS | Status: DC | PRN

## 2024-07-19 MED ORDER — HEPARIN SODIUM (PORCINE) 1000 UNIT/ML IJ SOLN
INTRAMUSCULAR | Status: DC | PRN
  Administered 2024-07-19: 5000 [IU] via INTRAVENOUS

## 2024-07-19 MED ORDER — LIDOCAINE-EPINEPHRINE (PF) 1 %-1:200000 IJ SOLN
INTRAMUSCULAR | Status: DC | PRN
  Administered 2024-07-19: 10 mL

## 2024-07-19 MED ORDER — FENTANYL CITRATE (PF) 100 MCG/2ML IJ SOLN
INTRAMUSCULAR | Status: DC | PRN
  Administered 2024-07-19 (×2): 25 ug via INTRAVENOUS

## 2024-07-19 MED ORDER — CLOPIDOGREL BISULFATE 75 MG PO TABS: 75.0000 mg | ORAL_TABLET | Freq: Every day | ORAL | Status: DC

## 2024-07-19 MED ORDER — FENTANYL CITRATE (PF) 100 MCG/2ML IJ SOLN
INTRAMUSCULAR | Status: AC
  Filled 2024-07-19: qty 2

## 2024-07-19 MED ORDER — LACTATED RINGERS IV SOLN: INTRAVENOUS | Status: DC | PRN

## 2024-07-19 MED ORDER — LIDOCAINE HCL (CARDIAC) PF 100 MG/5ML IV SOSY
PREFILLED_SYRINGE | INTRAVENOUS | Status: DC | PRN
  Administered 2024-07-19: 80 mg via INTRAVENOUS

## 2024-07-19 MED ORDER — PHENYLEPHRINE HCL-NACL 20-0.9 MG/250ML-% IV SOLN
INTRAVENOUS | Status: AC
  Filled 2024-07-19: qty 250

## 2024-07-19 MED ORDER — HYDROMORPHONE HCL 1 MG/ML IJ SOLN
1.0000 mg | Freq: Once | INTRAMUSCULAR | Status: AC | PRN
  Administered 2024-07-19: 1 mg via INTRAVENOUS

## 2024-07-19 MED ORDER — SODIUM CHLORIDE 0.9% FLUSH: 3.0000 mL | INTRAVENOUS | Status: DC | PRN

## 2024-07-19 MED ORDER — EPHEDRINE 5 MG/ML INJ
INTRAVENOUS | Status: AC
  Filled 2024-07-19: qty 5

## 2024-07-19 MED ORDER — NITROGLYCERIN 1 MG/10 ML FOR IR/CATH LAB
INTRA_ARTERIAL | Status: AC
  Filled 2024-07-19: qty 10

## 2024-07-19 MED ORDER — DEXAMETHASONE SOD PHOSPHATE PF 10 MG/ML IJ SOLN
INTRAMUSCULAR | Status: AC
  Filled 2024-07-19: qty 1

## 2024-07-19 MED ORDER — HEPARIN (PORCINE) IN NACL 1000-0.9 UT/500ML-% IV SOLN
INTRAVENOUS | Status: DC | PRN
  Administered 2024-07-19: 1000 mL

## 2024-07-19 MED ORDER — ONDANSETRON HCL 4 MG/2ML IJ SOLN
INTRAMUSCULAR | Status: DC | PRN
  Administered 2024-07-19: 4 mg via INTRAVENOUS

## 2024-07-19 MED ORDER — CEFAZOLIN SODIUM-DEXTROSE 2-4 GM/100ML-% IV SOLN
INTRAVENOUS | Status: AC
  Filled 2024-07-19: qty 100

## 2024-07-19 MED ORDER — HEPARIN SODIUM (PORCINE) 1000 UNIT/ML IJ SOLN
INTRAMUSCULAR | Status: AC
  Filled 2024-07-19: qty 10

## 2024-07-19 MED ORDER — MIDAZOLAM HCL 2 MG/ML PO SYRP
8.0000 mg | ORAL_SOLUTION | Freq: Once | ORAL | Status: AC | PRN
  Administered 2024-07-19: 8 mg via ORAL

## 2024-07-19 MED ORDER — PHENYLEPHRINE HCL-NACL 20-0.9 MG/250ML-% IV SOLN
INTRAVENOUS | Status: DC | PRN
  Administered 2024-07-19: 50 ug/min via INTRAVENOUS

## 2024-07-19 MED ORDER — DEXAMETHASONE SOD PHOSPHATE PF 10 MG/ML IJ SOLN
INTRAMUSCULAR | Status: DC | PRN
  Administered 2024-07-19: 5 mg via INTRAVENOUS

## 2024-07-19 MED ORDER — METHYLPREDNISOLONE SODIUM SUCC 125 MG IJ SOLR: 125.0000 mg | Freq: Once | INTRAMUSCULAR | Status: DC | PRN

## 2024-07-19 MED ORDER — DROPERIDOL 2.5 MG/ML IJ SOLN: 0.6250 mg | Freq: Once | INTRAMUSCULAR | Status: DC | PRN

## 2024-07-19 MED ORDER — KETAMINE HCL 10 MG/ML IJ SOLN
INTRAMUSCULAR | Status: DC | PRN
  Administered 2024-07-19: 50 mg via INTRAVENOUS

## 2024-07-19 MED ORDER — CLOPIDOGREL BISULFATE 75 MG PO TABS: 75.0000 mg | ORAL_TABLET | Freq: Every day | ORAL | 11 refills | Status: AC

## 2024-07-19 MED ORDER — HYDROMORPHONE HCL 1 MG/ML IJ SOLN
INTRAMUSCULAR | Status: AC
  Filled 2024-07-19: qty 1

## 2024-07-19 MED ORDER — EPHEDRINE SULFATE (PRESSORS) 25 MG/5ML IV SOSY
PREFILLED_SYRINGE | INTRAVENOUS | Status: DC | PRN
  Administered 2024-07-19: 10 mg via INTRAVENOUS

## 2024-07-19 MED ORDER — LIDOCAINE HCL (PF) 2 % IJ SOLN
INTRAMUSCULAR | Status: AC
  Filled 2024-07-19: qty 5

## 2024-07-19 MED ORDER — PROPOFOL 10 MG/ML IV BOLUS
INTRAVENOUS | Status: DC | PRN
  Administered 2024-07-19: 140 mg via INTRAVENOUS
  Administered 2024-07-19: 30 mg via INTRAVENOUS
  Administered 2024-07-19: 60 mg via INTRAVENOUS

## 2024-07-19 MED ORDER — NITROGLYCERIN 1 MG/10 ML FOR IR/CATH LAB
INTRA_ARTERIAL | Status: DC | PRN
  Administered 2024-07-19: 200 ug via INTRA_ARTERIAL
  Administered 2024-07-19: 300 ug via INTRA_ARTERIAL

## 2024-07-19 MED ORDER — ONDANSETRON HCL 4 MG/2ML IJ SOLN: 4.0000 mg | Freq: Four times a day (QID) | INTRAMUSCULAR | Status: DC | PRN

## 2024-07-19 MED ORDER — SODIUM CHLORIDE 0.9 % IV SOLN: INTRAVENOUS | Status: DC

## 2024-07-19 MED ORDER — ONDANSETRON HCL 4 MG/2ML IJ SOLN
INTRAMUSCULAR | Status: AC
  Filled 2024-07-19: qty 2

## 2024-07-19 MED ORDER — SODIUM CHLORIDE 0.9% FLUSH: 3.0000 mL | Freq: Two times a day (BID) | INTRAVENOUS | Status: DC

## 2024-07-19 MED ORDER — ACETAMINOPHEN 325 MG PO TABS: 650.0000 mg | ORAL_TABLET | Freq: Once | ORAL | Status: DC

## 2024-07-19 NOTE — Transfer of Care (Cosign Needed)
 Immediate Anesthesia Transfer of Care Note  Patient: Lynn Wood  Procedure(s) Performed: Lower Extremity Angiography (Right) LOWER EXTREMITY INTERVENTION (Right)  Patient Location: PACU  Anesthesia Type:General  Level of Consciousness: awake and pateint uncooperative  Airway & Oxygen Therapy: Patient Spontanous Breathing and Patient connected to face mask oxygen  Post-op Assessment: Report given to RN, Post -op Vital signs reviewed and stable, Patient moving all extremities, and Patient moving all extremities X 4  Post vital signs: Reviewed and stable  Last Vitals:  Vitals Value Taken Time  BP 178/86   Temp    Pulse 102   Resp 18   SpO2 98     Last Pain:  Vitals:   07/19/24 1518  TempSrc:   PainSc: 10-Worst pain ever         Complications: No notable events documented.

## 2024-07-19 NOTE — Op Note (Signed)
  [facility]  Percutaneous Study/Intervention Procedural Note   Date of Surgery: 07/19/2024  Surgeon(s):Ishitha Roper    Assistants:none  Pre-operative Diagnosis: PAD with rest pain  Post-operative diagnosis:  Same  Procedure(s) Performed:             1.  Ultrasound guidance for vascular access right posterior tibial artery             2.  Catheter placement into right common femoral artery from right posterior tibial artery approach             3.  Selective right lower extremity angiogram             4.  Stent placement to right popliteal artery for occlusion/complex lesion 5 mm diameter by 15 cm length life stent             5.  Stent placement to the right tibioperoneal trunk with 3.75 mm diameter by 28 mm length Esprit stent for occlusion/complex lesion  6.  Stent placement to the right posterior tibial artery with 3 mm diameter by 38 mm length Esprit stent for occlusion/complex lesion             7.  TR band placement right posterior tibial artery  EBL: 5 cc  Contrast: 30 cc  Fluoro Time: 7.2 minutes  Anesthesia: General              Indications:  Patient is a 79 y.o.female with severe peripheral arterial disease with progressive pain of the right lower extremity. The patient has noninvasive study showing a marked drop in the ABI of 0.2 now and she had an attempt at revascularization from a left femoral approach but was unable to cross the popliteal and tibial occlusions so we came back from a pedal approach with anesthesia as she was combative during her last procedure. The patient is brought in for angiography for further evaluation and potential treatment.  Due to the limb threatening nature of the situation, angiogram was performed for attempted limb salvage. The patient is aware that if the procedure fails, amputation would be expected.  The patient also understands that even with successful revascularization, amputation may still be required due to the  severity of the situation.  Risks and benefits are discussed and informed consent is obtained.   Procedure:  The patient was identified and appropriate procedural time out was performed.  The patient was then placed supine on the table and prepped and draped in the usual sterile fashion.  Anesthesia provided general anesthesia ultrasound was used to evaluate the right posterior tibial artery.  It was patent at the ankle.  A digital ultrasound image was acquired.  A micropuncture needle was used to access the right posterior tibial artery under direct ultrasound guidance and a permanent image was performed.  A micropuncture wire and sheath were then placed.  A 0.035 J wire was advanced without resistance and a 5Fr sheath was placed. Selective right lower extremity angiogram was then performed. This demonstrated occlusion of the proximal right posterior tibial artery, tibioperoneal trunk, and popliteal artery.  The SFA and the previously placed SFA stent were patent and the common femoral artery was patent. It was felt that it was in the patient's best interest to proceed with intervention after these images to avoid a second procedure and a larger amount of contrast and fluoroscopy based off of the findings from the initial angiogram. The patient was systemically heparinized. I then used a Kumpe catheter  and the 0.018 advantage wire to cross the occlusion in the posterior tibial artery, tibioperoneal trunk, and popliteal artery and confirm intraluminal flow in the superficial femoral artery/common femoral artery proximally.  I then placed a 0.014 wire.  We predilated the lesions with 3 mm diameter angioplasty balloon throughout the tibial vessels and popliteal vessels.  I then placed a 3 mm diameter by 38 mm length Esprit stent in the proximal posterior tibial artery.  A 3.7 mm diameter by 28 mm length Esprit stent was placed in the tibioperoneal trunk.  A 5 mm diameter by 15 cm length life stent was then placed  from the most distal popliteal artery up to the edge of the previously placed stent in the proximal popliteal artery.  We postdilated with a 3 mm balloon distally, 4 mm Lutonix drug-coated balloon in the TP trunk and popliteal artery, and a 5 mm diameter balloon at the most proximal popliteal artery to bridge into the old Viabahn stent.  Completion imaging showed excellent flow with less than 20% residual stenosis in all 3 stents after placement. I elected to terminate the procedure. The sheath was removed and TR band was placed with excellent hemostatic result. The patient was taken to the recovery room in stable condition having tolerated the procedure well.  Findings:                            Right Lower Extremity:  This demonstrated occlusion of the proximal right posterior tibial artery, tibioperoneal trunk, and popliteal artery.  The SFA and the previously placed SFA stent were patent and the common femoral artery was patent.   Disposition: Patient was taken to the recovery room in stable condition having tolerated the procedure well.  Complications: None  Selinda Gu 07/19/2024 3:11 PM   This note was created with Dragon Medical transcription system. Any errors in dictation are purely unintentional.

## 2024-07-19 NOTE — Interval H&P Note (Signed)
 History and Physical Interval Note:  07/19/2024 1:19 PM  Lynn Wood  has presented today for surgery, with the diagnosis of RLE angio w pedal approach   ANESTHESIA   Critical limb ischemia.  The various methods of treatment have been discussed with the patient and family. After consideration of risks, benefits and other options for treatment, the patient has consented to  Procedures: Lower Extremity Angiography (Right) LOWER EXTREMITY INTERVENTION (Right) as a surgical intervention.  The patient's history has been reviewed, patient examined, no change in status, stable for surgery.  I have reviewed the patient's chart and labs.  Questions were answered to the patient's satisfaction.     Ly Bacchi

## 2024-07-19 NOTE — Discharge Instructions (Addendum)
 Pedal Site Care Refer to this sheet in the next few weeks. These instructions provide you with information about caring for yourself after your procedure. Your health care provider may also give you more specific instructions. Your treatment has been planned according to current medical practices, but problems sometimes occur. Call your health care provider if you have any problems or questions after your procedure. What can I expect after the procedure? After your procedure, it is typical to have the following: Bruising at the radial site that usually fades within 1-2 weeks. Blood collecting in the tissue (hematoma) that may be painful to the touch. It should usually decrease in size and tenderness within 1-2 weeks.  Follow these instructions at home: Take medicines only as directed by your health care provider. If you are on a medication called Metformin please do not take for 48 hours after your procedure. Over the next 48hrs please increase your fluid intake of water and non caffeine beverages to flush the contrast dye out of your system.  You may shower 24 hours after the procedure  Leave your bandage on and gently wash the site with plain soap and water. Pat the area dry with a clean towel. Do not rub the site, because this may cause bleeding.  Remove your dressing 48hrs after your procedure and leave open to air.  Do not submerge your site in water for 7 days. This includes swimming and washing dishes.  Check your insertion site every day for redness, swelling, or drainage. Do not apply powder or lotion to the site. Do not flex or bend the affected leg for 24 hours or as directed by your health care provider. Do not push or pull heavy objects with the affected leg for 24 hours or as directed by your health care provider. Do not lift over 10 lb (4.5 kg) for 5 days after your procedure or as directed by your health care provider. Ask your health care provider when it is okay to: Return to work  or school. Resume usual physical activities or sports. Resume sexual activity. Do not drive home if you are discharged the same day as the procedure. Have someone else drive you. You may drive 48 hours after the procedure Do not operate machinery or power tools for 24 hours after the procedure. If your procedure was done as an outpatient procedure, which means that you went home the same day as your procedure, a responsible adult should be with you for the first 24 hours after you arrive home. Keep all follow-up visits as directed by your health care provider. This is important. Contact a health care provider if: You have a fever. You have chills. You have increased bleeding from the pedal site. Hold pressure on the site. Get help right away if: You have unusual pain at the pedal site. You have redness, warmth, or swelling at the pedal site. You have drainage (other than a small amount of blood on the dressing) from the pedal site. The pedal site is bleeding, and the bleeding does not stop after 15 minutes of holding steady pressure on the site. Your leg or foot becomes pale, cool, tingly, or numb. This information is not intended to replace advice given to you by your health care provider. Make sure you discuss any questions you have with your health care provider. Document Released: 07/04/2010 Document Revised: 11/07/2015 Document Reviewed: 12/18/2013 Elsevier Interactive Patient Education  2018 [employer].

## 2024-07-19 NOTE — Anesthesia Preprocedure Evaluation (Signed)
"                                    Anesthesia Evaluation  Patient identified by MRN, date of birth, ID band Patient awake    Reviewed: Allergy & Precautions, H&P , NPO status , Patient's Chart, lab work & pertinent test results  Airway Mallampati: II  TM Distance: >3 FB Neck ROM: Full    Dental no notable dental hx. (+) Edentulous Upper, Edentulous Lower   Pulmonary neg pulmonary ROS, Current Smoker   Pulmonary exam normal breath sounds clear to auscultation       Cardiovascular hypertension, + CAD and + Peripheral Vascular Disease  Normal cardiovascular exam Rhythm:Regular Rate:Normal     Neuro/Psych  Neuromuscular disease  negative psych ROS   GI/Hepatic negative GI ROS, Neg liver ROS,,,  Endo/Other  diabetes    Renal/GU negative Renal ROS  negative genitourinary   Musculoskeletal  (+) Arthritis , Osteoarthritis,    Abdominal   Peds negative pediatric ROS (+)  Hematology negative hematology ROS (+)   Anesthesia Other Findings   Reproductive/Obstetrics negative OB ROS                              Anesthesia Physical Anesthesia Plan  ASA: 3  Anesthesia Plan: General   Post-op Pain Management:    Induction: Intravenous and Inhalational  PONV Risk Score and Plan:   Airway Management Planned: LMA  Additional Equipment:   Intra-op Plan:   Post-operative Plan: Extubation in OR  Informed Consent: I have reviewed the patients History and Physical, chart, labs and discussed the procedure including the risks, benefits and alternatives for the proposed anesthesia with the patient or authorized representative who has indicated his/her understanding and acceptance.     Dental advisory given  Plan Discussed with: CRNA  Anesthesia Plan Comments:         Anesthesia Quick Evaluation  "

## 2024-07-20 ENCOUNTER — Encounter: Payer: Self-pay | Admitting: Vascular Surgery

## 2024-07-20 NOTE — Anesthesia Postprocedure Evaluation (Signed)
"   Anesthesia Post Note  Patient: Lynn Wood  Procedure(s) Performed: Lower Extremity Angiography (Right) LOWER EXTREMITY INTERVENTION (Right)  Patient location during evaluation: PACU Anesthesia Type: General Level of consciousness: awake and alert Pain management: pain level controlled Vital Signs Assessment: post-procedure vital signs reviewed and stable Respiratory status: spontaneous breathing, nonlabored ventilation, respiratory function stable and patient connected to nasal cannula oxygen Cardiovascular status: blood pressure returned to baseline and stable Postop Assessment: no apparent nausea or vomiting Anesthetic complications: no   No notable events documented.   Last Vitals:  Vitals:   07/19/24 1800 07/19/24 1830  BP: (!) 133/59 (!) 146/83  Pulse: (!) 102 (!) 103  Resp: (!) 21 20  Temp:  36.6 C  SpO2: 96% 96%    Last Pain:  Vitals:   07/19/24 1830  TempSrc: Temporal  PainSc: 0-No pain                 Redell MARLA Breaker      "

## 2024-08-11 ENCOUNTER — Encounter (INDEPENDENT_AMBULATORY_CARE_PROVIDER_SITE_OTHER)

## 2024-08-11 ENCOUNTER — Ambulatory Visit (INDEPENDENT_AMBULATORY_CARE_PROVIDER_SITE_OTHER): Admitting: Nurse Practitioner
